# Patient Record
Sex: Male | Born: 1965 | Race: White | Hispanic: No | Marital: Married | Smoking: Never smoker
Health system: Southern US, Community
[De-identification: ages and names within clinical notes are randomized; demographics above are authoritative.]

## PROBLEM LIST (undated history)

## (undated) DIAGNOSIS — L719 Rosacea, unspecified: Secondary | ICD-10-CM

## (undated) DIAGNOSIS — E119 Type 2 diabetes mellitus without complications: Secondary | ICD-10-CM

## (undated) DIAGNOSIS — I10 Essential (primary) hypertension: Secondary | ICD-10-CM

## (undated) DIAGNOSIS — B356 Tinea cruris: Secondary | ICD-10-CM

## (undated) DIAGNOSIS — E785 Hyperlipidemia, unspecified: Secondary | ICD-10-CM

## (undated) DIAGNOSIS — K760 Fatty (change of) liver, not elsewhere classified: Secondary | ICD-10-CM

## (undated) DIAGNOSIS — J309 Allergic rhinitis, unspecified: Secondary | ICD-10-CM

## (undated) DIAGNOSIS — F1020 Alcohol dependence, uncomplicated: Secondary | ICD-10-CM

## (undated) DIAGNOSIS — G5600 Carpal tunnel syndrome, unspecified upper limb: Secondary | ICD-10-CM

## (undated) HISTORY — DX: Type 2 diabetes mellitus without complications: E11.9

## (undated) HISTORY — PX: OTHER SURGICAL HISTORY: SHX169

## (undated) HISTORY — DX: Alcohol dependence, uncomplicated: F10.20

## (undated) HISTORY — DX: Rosacea, unspecified: L71.9

## (undated) HISTORY — DX: Fatty (change of) liver, not elsewhere classified: K76.0

## (undated) HISTORY — DX: Allergic rhinitis, unspecified: J30.9

## (undated) HISTORY — DX: Carpal tunnel syndrome, unspecified upper limb: G56.00

## (undated) HISTORY — PX: FRACTURE SURGERY: SHX138

## (undated) HISTORY — DX: Tinea cruris: B35.6

## (undated) HISTORY — DX: Essential (primary) hypertension: I10

## (undated) HISTORY — DX: Hyperlipidemia, unspecified: E78.5

---

## 2000-08-20 ENCOUNTER — Ambulatory Visit (HOSPITAL_COMMUNITY): Admission: RE | Admit: 2000-08-20 | Discharge: 2000-08-20 | Payer: Self-pay | Admitting: Gastroenterology

## 2000-08-20 ENCOUNTER — Encounter: Payer: Self-pay | Admitting: Gastroenterology

## 2002-08-21 ENCOUNTER — Emergency Department (HOSPITAL_COMMUNITY): Admission: EM | Admit: 2002-08-21 | Discharge: 2002-08-21 | Payer: Self-pay | Admitting: *Deleted

## 2006-05-27 ENCOUNTER — Ambulatory Visit: Payer: Self-pay | Admitting: Endocrinology

## 2007-02-18 ENCOUNTER — Ambulatory Visit: Payer: Self-pay | Admitting: Endocrinology

## 2007-02-18 LAB — CONVERTED CEMR LAB
ALT: 58 units/L — ABNORMAL HIGH (ref 0–53)
AST: 38 units/L — ABNORMAL HIGH (ref 0–37)
Albumin: 3.9 g/dL (ref 3.5–5.2)
Alkaline Phosphatase: 45 units/L (ref 39–117)
BUN: 22 mg/dL (ref 6–23)
Basophils Absolute: 0 10*3/uL (ref 0.0–0.1)
Basophils Relative: 0.6 % (ref 0.0–1.0)
Bilirubin Urine: NEGATIVE
Bilirubin, Direct: 0.1 mg/dL (ref 0.0–0.3)
CO2: 28 meq/L (ref 19–32)
Calcium: 9.2 mg/dL (ref 8.4–10.5)
Chloride: 108 meq/L (ref 96–112)
Cholesterol: 278 mg/dL (ref 0–200)
Creatinine, Ser: 0.9 mg/dL (ref 0.4–1.5)
Direct LDL: 170.4 mg/dL
Eosinophils Absolute: 0.3 10*3/uL (ref 0.0–0.6)
Eosinophils Relative: 5 % (ref 0.0–5.0)
GFR calc Af Amer: 121 mL/min
GFR calc non Af Amer: 100 mL/min
Glucose, Bld: 123 mg/dL — ABNORMAL HIGH (ref 70–99)
HCT: 41.2 % (ref 39.0–52.0)
HDL: 34.5 mg/dL — ABNORMAL LOW (ref >39.0)
Hemoglobin, Urine: NEGATIVE
Hemoglobin: 14.3 g/dL (ref 13.0–17.0)
Hgb A1c MFr Bld: 6.5 % — ABNORMAL HIGH (ref 4.6–6.0)
Ketones, ur: NEGATIVE mg/dL
Leukocytes, UA: NEGATIVE
Lymphocytes Relative: 35 % (ref 12.0–46.0)
MCHC: 34.6 g/dL (ref 30.0–36.0)
MCV: 92.1 fL (ref 78.0–100.0)
Monocytes Absolute: 0.5 10*3/uL (ref 0.2–0.7)
Monocytes Relative: 8.6 % (ref 3.0–11.0)
Neutro Abs: 3.3 10*3/uL (ref 1.4–7.7)
Neutrophils Relative %: 50.8 % (ref 43.0–77.0)
Nitrite: NEGATIVE
Platelets: 203 10*3/uL (ref 150–400)
Potassium: 4.3 meq/L (ref 3.5–5.1)
RBC: 4.47 M/uL (ref 4.22–5.81)
RDW: 11.9 % (ref 11.5–14.6)
Sodium: 141 meq/L (ref 135–145)
Specific Gravity, Urine: 1.025 (ref 1.000–1.03)
TSH: 5.02 microintl units/mL (ref 0.35–5.50)
Total Bilirubin: 0.9 mg/dL (ref 0.3–1.2)
Total CHOL/HDL Ratio: 8.1
Total Protein, Urine: NEGATIVE mg/dL
Total Protein: 7 g/dL (ref 6.0–8.3)
Triglycerides: 235 mg/dL (ref 0–149)
Urine Glucose: NEGATIVE mg/dL
Urobilinogen, UA: 0.2 (ref 0.0–1.0)
VLDL: 47 mg/dL — ABNORMAL HIGH (ref 0–40)
WBC: 6.3 10*3/uL (ref 4.5–10.5)
pH: 6 (ref 5.0–8.0)

## 2007-02-19 ENCOUNTER — Encounter: Payer: Self-pay | Admitting: Internal Medicine

## 2007-02-19 LAB — CONVERTED CEMR LAB: Hep B S Ab: NEGATIVE

## 2007-04-01 ENCOUNTER — Encounter: Payer: Self-pay | Admitting: Endocrinology

## 2007-04-01 DIAGNOSIS — J309 Allergic rhinitis, unspecified: Secondary | ICD-10-CM | POA: Insufficient documentation

## 2007-04-01 DIAGNOSIS — E119 Type 2 diabetes mellitus without complications: Secondary | ICD-10-CM

## 2007-04-01 DIAGNOSIS — I10 Essential (primary) hypertension: Secondary | ICD-10-CM

## 2007-04-01 HISTORY — DX: Allergic rhinitis, unspecified: J30.9

## 2007-04-01 HISTORY — DX: Type 2 diabetes mellitus without complications: E11.9

## 2007-04-01 HISTORY — DX: Essential (primary) hypertension: I10

## 2007-04-02 ENCOUNTER — Ambulatory Visit: Payer: Self-pay | Admitting: Endocrinology

## 2007-09-24 ENCOUNTER — Encounter: Payer: Self-pay | Admitting: Endocrinology

## 2007-09-28 ENCOUNTER — Ambulatory Visit: Payer: Self-pay | Admitting: Endocrinology

## 2007-09-28 LAB — CONVERTED CEMR LAB
ALT: 42 units/L (ref 0–53)
AST: 26 units/L (ref 0–37)
Albumin: 3.8 g/dL (ref 3.5–5.2)
Alkaline Phosphatase: 51 units/L (ref 39–117)
Bilirubin, Direct: 0.2 mg/dL (ref 0.0–0.3)
Cholesterol: 353 mg/dL (ref 0–200)
Direct LDL: 140.1 mg/dL
HDL: 42.2 mg/dL (ref >39.0)
Hgb A1c MFr Bld: 7.2 % — ABNORMAL HIGH (ref 4.6–6.0)
Total Bilirubin: 0.9 mg/dL (ref 0.3–1.2)
Total CHOL/HDL Ratio: 8.4
Total Protein: 7 g/dL (ref 6.0–8.3)
Triglycerides: 655 mg/dL (ref 0–149)
VLDL: 131 mg/dL — ABNORMAL HIGH (ref 0–40)

## 2007-09-29 ENCOUNTER — Ambulatory Visit: Payer: Self-pay | Admitting: Endocrinology

## 2007-09-29 DIAGNOSIS — E785 Hyperlipidemia, unspecified: Secondary | ICD-10-CM

## 2007-09-29 HISTORY — DX: Hyperlipidemia, unspecified: E78.5

## 2007-12-28 ENCOUNTER — Encounter: Payer: Self-pay | Admitting: Endocrinology

## 2008-09-30 ENCOUNTER — Encounter: Payer: Self-pay | Admitting: Endocrinology

## 2008-10-06 ENCOUNTER — Ambulatory Visit: Payer: Self-pay | Admitting: Endocrinology

## 2008-10-06 DIAGNOSIS — G5600 Carpal tunnel syndrome, unspecified upper limb: Secondary | ICD-10-CM

## 2008-10-06 HISTORY — DX: Carpal tunnel syndrome, unspecified upper limb: G56.00

## 2008-10-06 LAB — CONVERTED CEMR LAB: Hgb A1c MFr Bld: 7 % — ABNORMAL HIGH (ref 4.6–6.0)

## 2008-11-07 ENCOUNTER — Ambulatory Visit: Payer: Self-pay | Admitting: Endocrinology

## 2008-11-07 DIAGNOSIS — B356 Tinea cruris: Secondary | ICD-10-CM | POA: Insufficient documentation

## 2008-11-07 HISTORY — DX: Tinea cruris: B35.6

## 2009-10-03 ENCOUNTER — Encounter: Payer: Self-pay | Admitting: Endocrinology

## 2009-10-23 ENCOUNTER — Telehealth: Payer: Self-pay | Admitting: Endocrinology

## 2009-10-27 ENCOUNTER — Telehealth: Payer: Self-pay | Admitting: Endocrinology

## 2009-11-28 ENCOUNTER — Ambulatory Visit: Payer: Self-pay | Admitting: Endocrinology

## 2009-11-28 LAB — CONVERTED CEMR LAB
ALT: 36 units/L (ref 0–53)
AST: 23 units/L (ref 0–37)
Albumin: 3.8 g/dL (ref 3.5–5.2)
Alkaline Phosphatase: 58 units/L (ref 39–117)
BUN: 21 mg/dL (ref 6–23)
Basophils Absolute: 0 10*3/uL (ref 0.0–0.1)
Basophils Relative: 0.3 % (ref 0.0–3.0)
Bilirubin Urine: NEGATIVE
Bilirubin, Direct: 0 mg/dL (ref 0.0–0.3)
CO2: 30 meq/L (ref 19–32)
Calcium: 9.4 mg/dL (ref 8.4–10.5)
Chloride: 101 meq/L (ref 96–112)
Cholesterol: 340 mg/dL — ABNORMAL HIGH (ref 0–200)
Creatinine, Ser: 0.9 mg/dL (ref 0.4–1.5)
Creatinine,U: 138.3 mg/dL
Direct LDL: 131.8 mg/dL
Eosinophils Absolute: 0.3 10*3/uL (ref 0.0–0.7)
Eosinophils Relative: 3.6 % (ref 0.0–5.0)
GFR calc non Af Amer: 98.69 mL/min (ref >60)
Glucose, Bld: 183 mg/dL — ABNORMAL HIGH (ref 70–99)
HCT: 40.2 % (ref 39.0–52.0)
HDL: 48.9 mg/dL (ref >39.00)
Hemoglobin, Urine: NEGATIVE
Hemoglobin: 14.4 g/dL (ref 13.0–17.0)
Hgb A1c MFr Bld: 7.7 % — ABNORMAL HIGH (ref 4.6–6.5)
Ketones, ur: NEGATIVE mg/dL
Leukocytes, UA: NEGATIVE
Lymphocytes Relative: 25.3 % (ref 12.0–46.0)
Lymphs Abs: 1.9 10*3/uL (ref 0.7–4.0)
MCHC: 35.7 g/dL (ref 30.0–36.0)
MCV: 91.7 fL (ref 78.0–100.0)
Microalb Creat Ratio: 12.3 mg/g (ref 0.0–30.0)
Microalb, Ur: 1.7 mg/dL (ref 0.0–1.9)
Monocytes Absolute: 0.7 10*3/uL (ref 0.1–1.0)
Monocytes Relative: 9.4 % (ref 3.0–12.0)
Neutro Abs: 4.5 10*3/uL (ref 1.4–7.7)
Neutrophils Relative %: 61.4 % (ref 43.0–77.0)
Nitrite: NEGATIVE
Platelets: 197 10*3/uL (ref 150.0–400.0)
Potassium: 5 meq/L (ref 3.5–5.1)
RBC: 4.39 M/uL (ref 4.22–5.81)
RDW: 13.3 % (ref 11.5–14.6)
Sodium: 139 meq/L (ref 135–145)
Specific Gravity, Urine: >=1.03 (ref 1.000–1.030)
TSH: 4.43 microintl units/mL (ref 0.35–5.50)
Total Bilirubin: 0.6 mg/dL (ref 0.3–1.2)
Total CHOL/HDL Ratio: 7
Total Protein, Urine: NEGATIVE mg/dL
Total Protein: 7.1 g/dL (ref 6.0–8.3)
Triglycerides: 773 mg/dL — ABNORMAL HIGH (ref 0.0–149.0)
Urine Glucose: NEGATIVE mg/dL
Urobilinogen, UA: 0.2 (ref 0.0–1.0)
VLDL: 154.6 mg/dL — ABNORMAL HIGH (ref 0.0–40.0)
WBC: 7.4 10*3/uL (ref 4.5–10.5)
pH: 5.5 (ref 5.0–8.0)

## 2009-11-30 ENCOUNTER — Ambulatory Visit: Payer: Self-pay | Admitting: Endocrinology

## 2009-11-30 DIAGNOSIS — F1020 Alcohol dependence, uncomplicated: Secondary | ICD-10-CM

## 2009-11-30 DIAGNOSIS — F1010 Alcohol abuse, uncomplicated: Secondary | ICD-10-CM | POA: Insufficient documentation

## 2009-11-30 HISTORY — DX: Alcohol dependence, uncomplicated: F10.20

## 2010-05-07 ENCOUNTER — Telehealth: Payer: Self-pay | Admitting: Endocrinology

## 2010-05-14 ENCOUNTER — Ambulatory Visit: Payer: Self-pay | Admitting: Endocrinology

## 2010-05-14 ENCOUNTER — Encounter: Payer: Self-pay | Admitting: Endocrinology

## 2010-05-14 DIAGNOSIS — L719 Rosacea, unspecified: Secondary | ICD-10-CM

## 2010-05-14 HISTORY — DX: Rosacea, unspecified: L71.9

## 2010-09-16 LAB — CONVERTED CEMR LAB
ALT: 39 units/L (ref 0–53)
AST: 20 units/L (ref 0–37)
Albumin: 3.7 g/dL (ref 3.5–5.2)
Alkaline Phosphatase: 44 units/L (ref 39–117)
BUN: 16 mg/dL (ref 6–23)
Basophils Absolute: 0 10*3/uL (ref 0.0–0.1)
Basophils Relative: 0.3 % (ref 0.0–3.0)
Bilirubin Urine: NEGATIVE
Bilirubin, Direct: 0.1 mg/dL (ref 0.0–0.3)
CO2: 29 meq/L (ref 19–32)
Calcium: 9.6 mg/dL (ref 8.4–10.5)
Chloride: 103 meq/L (ref 96–112)
Cholesterol: 291 mg/dL — ABNORMAL HIGH (ref 0–200)
Creatinine, Ser: 0.9 mg/dL (ref 0.4–1.5)
Creatinine,U: 103.7 mg/dL
Direct LDL: 153.3 mg/dL
Eosinophils Absolute: 0.3 10*3/uL (ref 0.0–0.7)
Eosinophils Relative: 4.3 % (ref 0.0–5.0)
GFR calc non Af Amer: 103.77 mL/min (ref >60)
Glucose, Bld: 181 mg/dL — ABNORMAL HIGH (ref 70–99)
HCT: 41.4 % (ref 39.0–52.0)
HDL: 40.8 mg/dL (ref >39.00)
Hemoglobin, Urine: NEGATIVE
Hemoglobin: 14.7 g/dL (ref 13.0–17.0)
Hgb A1c MFr Bld: 8.3 % — ABNORMAL HIGH (ref 4.6–6.5)
Ketones, ur: NEGATIVE mg/dL
Leukocytes, UA: NEGATIVE
Lymphocytes Relative: 32.1 % (ref 12.0–46.0)
Lymphs Abs: 2.1 10*3/uL (ref 0.7–4.0)
MCHC: 35.4 g/dL (ref 30.0–36.0)
MCV: 92.7 fL (ref 78.0–100.0)
Microalb Creat Ratio: 5.1 mg/g (ref 0.0–30.0)
Microalb, Ur: 5.3 mg/dL — ABNORMAL HIGH (ref 0.0–1.9)
Monocytes Absolute: 0.5 10*3/uL (ref 0.1–1.0)
Monocytes Relative: 8.4 % (ref 3.0–12.0)
Neutro Abs: 3.6 10*3/uL (ref 1.4–7.7)
Neutrophils Relative %: 54.9 % (ref 43.0–77.0)
Nitrite: NEGATIVE
Platelets: 208 10*3/uL (ref 150.0–400.0)
Potassium: 5.2 meq/L — ABNORMAL HIGH (ref 3.5–5.1)
RBC: 4.47 M/uL (ref 4.22–5.81)
RDW: 13.6 % (ref 11.5–14.6)
Sodium: 139 meq/L (ref 135–145)
Specific Gravity, Urine: 1.025 (ref 1.000–1.030)
TSH: 5 microintl units/mL (ref 0.35–5.50)
Total Bilirubin: 0.5 mg/dL (ref 0.3–1.2)
Total CHOL/HDL Ratio: 7
Total Protein, Urine: NEGATIVE mg/dL
Total Protein: 6.5 g/dL (ref 6.0–8.3)
Triglycerides: 394 mg/dL — ABNORMAL HIGH (ref 0.0–149.0)
Urine Glucose: NEGATIVE mg/dL
Urobilinogen, UA: 0.2 (ref 0.0–1.0)
VLDL: 78.8 mg/dL — ABNORMAL HIGH (ref 0.0–40.0)
WBC: 6.5 10*3/uL (ref 4.5–10.5)
pH: 5.5 (ref 5.0–8.0)

## 2010-09-18 NOTE — Assessment & Plan Note (Signed)
Summary: discuss meds/#/cd   Vital Signs:  Patient profile:   45 year old male Height:      69 inches (175.26 cm) Weight:      266.38 pounds (121.08 kg) BMI:     39.48 O2 Sat:      97 % on Room air Temp:     98.4 degrees F (36.89 degrees C) oral Pulse rate:   72 / minute BP sitting:   110 / 80  (left arm) Cuff size:   large  Vitals Entered By: Brenton Grills MA (May 14, 2010 8:11 AM)  O2 Flow:  Room air CC: Dicuss medications/pt is not currently taking Bromocriptine/aj Is Patient Diabetic? Yes   CC:  Dicuss medications/pt is not currently taking Bromocriptine/aj.  History of Present Illness: here for regular wellness examination.  He's feeling pretty well in general, and does not smoke.   Current Medications (verified): 1)  Multivitamins   Tabs (Multiple Vitamin) .... Take 1 By Mouth Qd 2)  Actoplus Met 15-500 Mg  Tabs (Pioglitazone Hcl-Metformin Hcl) .... Take 2 By Mouth Q Am, 1 By Mouth Pm 3)  Zestoretic 20-12.5 Mg  Tabs (Lisinopril-Hydrochlorothiazide) .... Qd 4)  Lotrisone 1-0.05 % Crea (Clotrimazole-Betamethasone) .... Two Times A Day As Needed Rash 5)  Bromocriptine Mesylate 2.5 Mg Tabs (Bromocriptine Mesylate) .... 1/2 Tab At Bedtime 6)  Metronidazole 0.75 % Gel (Metronidazole) .... Apply To The Face At Bedtime  Allergies (verified): 1)  ! Mevacor 2)  ! Vasotec  Family History: Reviewed history from 11/07/2008 and no changes required. no cancer  Social History: Reviewed history from 11/07/2008 and no changes required. works indust mfg married  Review of Systems  The patient denies weight loss, vision loss, decreased hearing, chest pain, syncope, dyspnea on exertion, prolonged cough, headaches, abdominal pain, melena, hematochezia, severe indigestion/heartburn, hematuria, suspicious skin lesions, and depression.         facial rash is better with metrogel  Physical Exam  General:  morbidly obese.  no distress  Head:  head: no deformity eyes: no  periorbital swelling, no proptosis external nose and ears are normal mouth: no lesion seen Neck:  Supple without thyroid enlargement or tenderness.  Abdomen:  abdomen is soft, nontender.  no hepatosplenomegaly.   not distended.  no hernia  Genitalia:  Normal external male genitalia with no urethral discharge.  Msk:  muscle bulk and strength are grossly normal.  no obvious joint swelling.  gait is normal and steady  Pulses:  dorsalis pedis intact bilat.  no carotid bruit  Extremities:  no deformity.  no ulcer on the feet.  feet are of normal color and temp.   trace right pedal edema and trace left pedal edema.   Neurologic:  cn 2-12 grossly intact.   readily moves all 4's.   sensation is intact to touch on the feet  Skin:  normal texture and temp.  no rash.  not diaphoretic  Cervical Nodes:  No significant adenopathy.  Psych:  Alert and cooperative; normal mood and affect; normal attention span and concentration.   Additional Exam:  SEPARATE EVALUATION FOLLOWS--EACH PROBLEM HERE IS NEW, NOT RESPONDING TO TREATMENT, OR POSES SIGNIFICANT RISK TO THE PATIENT'S HEALTH: HISTORY OF THE PRESENT ILLNESS: he takes meds as rx'ed.  no weight gain PAST MEDICAL HISTORY reviewed and up to date today REVIEW OF SYSTEMS: denies chest pain PHYSICAL EXAMINATION: clear to auscultation.  no respiratory distress dorsalis pedis intact bilat.  no carotid bruit cv:  reg rate and rhythm, no  murmur LAB/XRAY RESULTS: elev a1c and ldl are noted IMPRESSION: dm, needs increased rx dyslipidemia, needs increased rx PLAN: see instruction sheet   Impression & Recommendations:  Problem # 1:  ROUTINE GENERAL MEDICAL EXAM@HEALTH  CARE FACL (ICD-V70.0)  Other Orders: EKG w/ Interpretation (93000) TLB-Lipid Panel (80061-LIPID) TLB-BMP (Basic Metabolic Panel-BMET) (80048-METABOL) TLB-CBC Platelet - w/Differential (85025-CBCD) TLB-Hepatic/Liver Function Pnl (80076-HEPATIC) TLB-TSH (Thyroid Stimulating  Hormone) (84443-TSH) TLB-A1C / Hgb A1C (Glycohemoglobin) (83036-A1C) TLB-Microalbumin/Creat Ratio, Urine (82043-MALB) TLB-Udip w/ Micro (81001-URINE) Est. Patient Level III (16109) Est. Patient 40-64 years (60454)  Preventive Care Screening     flu shot and pneumovax refused 2011   Patient Instructions: 1)  please consider these measures for your health:  minimize alcohol.  do not use tobacco products.  have a colonoscopy at least every 10 years from age 58.  keep firearms safely stored.  always use seat belts.  have working smoke alarms in your home.  see an eye doctor and dentist regularly.  never drive under the influence of alcohol or drugs (including prescription drugs).  those with fair skin should take precautions against the sun. 2)  blood tests are being ordered for you today.  please call [phone] to hear your test results. 3)  Please schedule a follow-up appointment in 6 months. 4)  (pt refuses flu shot and pneumovax) 5)  (update: i left message on phone-tree:  start parlodel 2.5 mg at bedtime.  you should take chol medication.    ret jan 2012). Prescriptions: BROMOCRIPTINE MESYLATE 2.5 MG TABS (BROMOCRIPTINE MESYLATE) 1/2 tab at bedtime  #30 x 5   Entered and Authorized by:   Minus Breeding MD   Signed by:   Minus Breeding MD on 05/14/2010   Method used:   [facility]        [facility] [address] [phone]* (retail)       [address]       [place], [place]  [address]       Ph: [phone] or [phone]       Fax: [phone]   RxID:   [id number]    Preventive Care Screening     flu shot and pneumovax refused 2011

## 2010-09-18 NOTE — Progress Notes (Signed)
  Phone Note Outgoing Call   Call placed by: Ami Bullins CMA,  October 23, 2009 1:47 PM Call placed to: Patient Summary of Call: spoke with pt and informed him that he does need to come into the office for a follow up office visit. Pt is due for A1C, LDL and triglyceride. This needs to be put in EMR orders. Pt states he will come in for labs and when he has his labs drawn he will come up and sch an office visit. Can you put these labs orders in thanks. Initial call taken by: Ami Bullins CMA,  October 23, 2009 1:49 PM  Follow-up for Phone Call        cpx labs a1c and microalbumin 250.00 Follow-up by: Minus Breeding MD,  October 23, 2009 2:07 PM  Additional Follow-up for Phone Call Additional follow up Details #1::        sent flag to schedulers to put in for pt Additional Follow-up by: Ami Bullins CMA,  October 23, 2009 2:17 PM

## 2010-09-18 NOTE — Progress Notes (Signed)
    Appended Document:  Left message for pt to return my call. Pt needs to schedule appt.

## 2010-09-18 NOTE — Letter (Signed)
Summary: [facility]  [facility]   Imported By: Sherian Rein 10/10/2009 10:33:41  _____________________________________________________________________  External Attachment:    Type:   Image     Comment:   External Document

## 2010-09-18 NOTE — Assessment & Plan Note (Signed)
Summary: FU ON MEDS/NWS   Vital Signs:  Patient profile:   45 year old male Height:      69 inches (175.26 cm) Weight:      268.38 pounds (121.99 kg) BMI:     39.78 O2 Sat:      96 % on Room air Temp:     98.0 degrees F (36.67 degrees C) oral Pulse rate:   78 / minute BP sitting:   108 / 70  (left arm) Cuff size:   large  Vitals Entered By: Josph Macho RMA (November 30, 2009 9:04 AM)  O2 Flow:  Room air CC: Follow-up visit/ CF Is Patient Diabetic? Yes   CC:  Follow-up visit/ CF.  History of Present Illness: pt says he drinks alcohol "more than i should" no cbg record, but states cbg's are high-100's. pt states few mos of slight rash on the malar areas, but no associated itching.  Current Medications (verified): 1)  Multivitamins   Tabs (Multiple Vitamin) .... Take 1 By Mouth Qd 2)  Actoplus Met 15-500 Mg  Tabs (Pioglitazone Hcl-Metformin Hcl) .... Take 2 By Mouth Q Am, 1 By Mouth Pm 3)  Zestoretic 20-12.5 Mg  Tabs (Lisinopril-Hydrochlorothiazide) .... Qd 4)  Lotrisone 1-0.05 % Crea (Clotrimazole-Betamethasone) .... Two Times A Day As Needed Rash  Allergies (verified): 1)  ! Mevacor 2)  ! Vasotec  Past History:  Past Medical History: Last updated: 04/01/2007 Allergic rhinitis Diabetes mellitus, type II Hypertension Dyslipidemia Hepatic Steatosis  Review of Systems       The patient complains of weight gain.  The patient denies dyspnea on exertion.    Physical Exam  General:  obese.   Head:  mild red rash on the malar areas Additional Exam:  test results are reviewed: Cholesterol          [H]  340 mg/dL                   6-045 Triglycerides        [H]  773.0 mg/dL                 4.0-981.1 HDL                       91.47 mg/dL   Cholesterol LDL    829.5 mg/dL  Hemoglobin A2Z       [H]  7.7 %      Impression & Recommendations:  Problem # 1:  DIABETES MELLITUS, TYPE II (ICD-250.00) needs increased rx  Problem # 2:  HYPERLIPIDEMIA, ATHEROGENIC  (ICD-272.4) needs increased rx  Problem # 3:  ALCOHOLISM (ICD-303.90) this worsens both #1 and 2  Problem # 4:  weight gain  Problem # 5:  rash ? rosacea  Medications Added to Medication List This Visit: 1)  Bromocriptine Mesylate 2.5 Mg Tabs (Bromocriptine mesylate) .... 1/2 tab at bedtime 2)  Metronidazole 0.75 % Gel (Metronidazole) .... Apply to the face at bedtime  Other Orders: Est. Patient Level IV (30865)  Patient Instructions: 1)  you should consider weight-loss surgery.  please let me know if you wish to be scheduled for an informational meeting. 2)  please consider these measures for your health:  minimize alcohol.  do not use tobacco products.  keep firearms safely stored.  always use seat belts.  have working smoke alarms in your home.  see the dentist regularly.  never drive under the influence of alcohol or drugs (including prescription drugs).  those  with fair skin should take precautions against the sun. 3)  add bromocriptine 1/2 of 2.5 mg at night 4)  take a trial of metrogel cream at bedtime, to the face 5)  in 3 months, go to lab for fasting blood tests (lipids 272.5, and a1c 250.00), then have a physical a week or so later. 6)  please let me know if you decide to take the "fenofibrate" pill for the triglycerides. Prescriptions: METRONIDAZOLE 0.75 % GEL (METRONIDAZOLE) apply to the face at bedtime  #1 med tube x 11   Entered and Authorized by:   Minus Breeding MD   Signed by:   Minus Breeding MD on 11/30/2009   Method used:   [facility]        [facility] [address] [phone]* (retail)       [address]       [place], [place]  [address]       Ph: [phone] or [phone]       Fax: [phone]   RxID:   [id number] BROMOCRIPTINE MESYLATE 2.5 MG TABS (BROMOCRIPTINE MESYLATE) 1/2 tab at bedtime  #30 x 5   Entered and Authorized by:   Minus Breeding MD   Signed by:   Minus Breeding MD on 11/30/2009   Method used:    [facility]ronically to        [facility] [address] [phone]* (retail)       [address]       [place], [place]  [address]       Ph: [phone] or [phone]       Fax: [phone]   RxID:   [id number]

## 2010-09-18 NOTE — Progress Notes (Signed)
Summary: Actos?  Phone Note Call from Patient Call back at Home Phone [phone]   Caller: Patient Summary of Call: Pt called concerned about recent television ads advising on the dangers of long-term use of Actos. Pt is requesting advisement form [facility]. Pt advised that MD out of office today but will wait for his return to advise. Initial call taken by: Margaret Pyle, CMA,  May 07, 2010 3:05 PM  Follow-up for Phone Call        the television ads are because some people who take actos took avandia prior, and those are the patients they want.  the side-effect of the med is swellling of the legs.  call if you have that. Follow-up by: Minus Breeding MD,  May 07, 2010 5:28 PM  Additional Follow-up for Phone Call Additional follow up Details #1::        left message on machine for pt to return my call  Margaret Pyle, CMA  May 08, 2010 8:09 AM   Pt informed in detail via VM, told to call back with any further questions Additional Follow-up by: Margaret Pyle, CMA,  May 08, 2010 9:33 AM

## 2010-10-04 ENCOUNTER — Telehealth: Payer: Self-pay | Admitting: Endocrinology

## 2010-10-10 NOTE — Progress Notes (Signed)
Summary: rx refill req  Phone Note Call from Patient Call back at Home Phone [phone]   Caller: Patient Summary of Call: Pt is requesting refill of Ascensia test strips Initial call taken by: Brenton Grills CMA Duncan Dull),  October 04, 2010 3:29 PM  Follow-up for Phone Call        pt informed via VM Follow-up by: Brenton Grills CMA Duncan Dull),  October 04, 2010 3:31 PM    New/Updated Medications: ASCENSIA AUTODISC TEST  DISK (GLUCOSE BLOOD) use as directed dx 250.00 Prescriptions: ASC[employer]IA AUTODISC TEST  DISK (GLUCOSE BLOOD) use as directed dx 250.00  #1 month x 3   Entered by:   Brenton Grills CMA (AAMA)   Authorized by:   Minus Breeding MD   Signed by:   Brenton Grills CMA (AAMA) on 10/04/2010   Method used:   Electronically to        [facility] [address] [phone]* (retail)       [address]       [place], [place]  [address]       Ph: [phone] or [phone]       Fax: [phone]   RxID:   [id number]

## 2010-12-17 ENCOUNTER — Inpatient Hospital Stay (HOSPITAL_COMMUNITY)
Admission: EM | Admit: 2010-12-17 | Discharge: 2010-12-18 | DRG: 512 | Disposition: A | Discharge status: Home / Self Care | Payer: Worker's Compensation | Attending: Orthopedic Surgery | Admitting: Orthopedic Surgery

## 2010-12-17 ENCOUNTER — Emergency Department (HOSPITAL_COMMUNITY): Payer: Worker's Compensation

## 2010-12-17 DIAGNOSIS — I10 Essential (primary) hypertension: Secondary | ICD-10-CM | POA: Diagnosis present

## 2010-12-17 DIAGNOSIS — W3189XA Contact with other specified machinery, initial encounter: Secondary | ICD-10-CM

## 2010-12-17 DIAGNOSIS — E119 Type 2 diabetes mellitus without complications: Secondary | ICD-10-CM | POA: Diagnosis present

## 2010-12-17 DIAGNOSIS — S52509B Unspecified fracture of the lower end of unspecified radius, initial encounter for open fracture type I or II: Principal | ICD-10-CM | POA: Diagnosis present

## 2010-12-17 DIAGNOSIS — Y9289 Other specified places as the place of occurrence of the external cause: Secondary | ICD-10-CM

## 2010-12-17 LAB — CBC
HCT: 44 % (ref 39.0–52.0)
MCH: 32.1 pg (ref 26.0–34.0)
MCV: 92.8 fL (ref 78.0–100.0)
Platelets: 207 10*3/uL (ref 150–400)
RDW: 13.6 % (ref 11.5–15.5)

## 2010-12-17 LAB — SAMPLE TO BLOOD BANK

## 2010-12-17 LAB — GLUCOSE, CAPILLARY
Glucose-Capillary: 276 mg/dL — ABNORMAL HIGH (ref 70–99)
Glucose-Capillary: 226 mg/dL — ABNORMAL HIGH (ref 70–99)

## 2010-12-17 LAB — DIFFERENTIAL
Basophils Absolute: 0 10*3/uL (ref 0.0–0.1)
Lymphocytes Relative: 39 % (ref 12–46)
Neutro Abs: 3.6 10*3/uL (ref 1.7–7.7)

## 2010-12-17 LAB — POCT I-STAT, CHEM 8
BUN: 17 mg/dL (ref 6–23)
Calcium, Ion: 1.1 mmol/L — ABNORMAL LOW (ref 1.12–1.32)
Chloride: 108 mEq/L (ref 96–112)
Creatinine, Ser: 1.1 mg/dL (ref 0.4–1.5)
Sodium: 141 mEq/L (ref 135–145)

## 2010-12-17 LAB — MRSA PCR SCREENING: MRSA by PCR: NEGATIVE

## 2010-12-18 LAB — GLUCOSE, CAPILLARY: Glucose-Capillary: 241 mg/dL — ABNORMAL HIGH (ref 70–99)

## 2010-12-24 ENCOUNTER — Other Ambulatory Visit: Payer: Self-pay | Admitting: Endocrinology

## 2010-12-25 NOTE — H&P (Signed)
NAME:  BRODERIC, BARA NO.:  [id number]  MEDICAL RECORD NO.:  [id number]           PATIENT TYPE:  I  LOCATION:  5012                         FACILITY:  [facility]  PHYSICIAN:  Madelynn Done, MD  DATE OF BIRTH:  10/01/1965  DATE OF ADMISSION:  12/17/2010 DATE OF DISCHARGE:                             HISTORY & PHYSICAL   REASON FOR ADMISSION:  Right distal radius fracture.  BRIEF HISTORY:  Mr. Rayman is a 45 year old right-hand-dominant gentleman who was at work, sustained a crushing injury to his right distal wrist.  The patient's wrist caught in between a metal object. The patient had to call someone over to have the object released off his distal radius.  The patient presented to the emergency department after his injury.  The patient was seen and evaluated by the emergency department and I was consulted for further management.  The patient was felt to have an open distal radius fracture.  No other injuries other than this to his right arm.  The patient complains of pain to the right arm.  He has a little bit of tingling in his fingertips.  His past medical history is significant for diabetes and hypertension. The remainder of his medical history as noted in the chart by Dr. Read Drivers and reviewed in the medical record.  On examination, this is healthy-appearing male.  Height and weight listed.  Vital Signs as noted in the medical chart.  He has a good hand coordination in his left hand.  Normal mood.  He is alert and oriented to person, place, and time in no acute distress.  On examination of the right upper extremity, the patient does have a several centimeter transverse laceration over the dorsal aspect of the wrist.  No wounds volarly.  Does appear to communicate with the fracture site.  He has a very poor digital extension and finger motion secondary to his injury.  His arm is swollen, but his compartments are soft.  He has a good radial pulse.  His  fingertips are warm, well-perfused. Sensation to light touch is present in the median and ulnar distribution.  His wrist and forearm mobility is not assessed secondary to his injury.  He does not have any elbow pain to palpation.  He has good shoulder mobility, but it is limited secondary to his underlying injury.  His radiographs were reviewed which did show the open wound injury to the soft tissue with the underlying distal radial metaphyseal fracture and distal ulna fracture.  IMPRESSION:  Right distal radius and ulna open fractures and soft tissue injury.  PLAN:  Today the findings were reviewed with Arlys John.  Lucciano was splinted temporarily in the ER.  Betadine dressing was applied to the open wound. The patient will be taken to the operating room this morning.  We talked about the risks of surgery to include, but not limited to bleeding, infection, damage to nearby nerves, arteries, tendons, nonunion, malunion, hardware repair, loss of motion of wrist and digits, and need for further surgical intervention.  We talked about the reason for intervention.  Overnight admission for IV antibiotics and pain control. All  questions were answered and encouraged with Hardin today.  The patient voiced understanding of the plan, the reason for the intervention.  Talked about exploration of the wound and possible tendon repair as indicated.  The patient voiced understanding of the reason for the intervention.  The patient will be admitted for the operative procedure today.  Please note the patient did have preoperative lab studies was were in the medical chart.  EKG and chest x-rays were done per the ER emergency department.     Madelynn Done, MD     FWO/MEDQ  D:  12/17/2010  T:  12/17/2010  Job:  [id number]  Electronically Signed by Bradly Bienenstock IV MD on 12/25/2010 09:07:41 AM

## 2010-12-25 NOTE — Op Note (Signed)
NAME:  Larry Rose, LEVER NO.:  [id number]  MEDICAL RECORD NO.:  [id number]           PATIENT TYPE:  I  LOCATION:  5012                         FACILITY:  [facility]  PHYSICIAN:  Madelynn Done, MD  DATE OF BIRTH:  [birth year]  DATE OF PROCEDURE:  12/17/2010 DATE OF DISCHARGE:                              OPERATIVE REPORT   PREOPERATIVE DIAGNOSIS:  Grade 1 open right distal radius fracture, Galeazzi fracture-dislocation.  POSTOPERATIVE DIAGNOSIS:  Grade 1 open right distal radius fracture, Galeazzi fracture-dislocation.  ATTENDING PHYSICIAN:  Dr. Bradly Bienenstock, who was scrubbed and present for the entire procedure.  ASSISTANT SURGEON:  Dr. Dairl Ponder, who was scrubbed and present for the entire procedure.  SURGICAL PROCEDURE: 1. Open treatment of right distal radial Galeazzi fracture-dislocation     with closed treatment of the distal radioulnar joint without     internal fixation. 2. Radiographs, 3 views, right wrist. 3. Right wrist brachioradialis tendon lengthening and release. 4. Debridement of skin, subcutaneous tissue, and bone associated with     open fracture, right radius. 5. Closed treatment of right distal ulna fracture, ulnar head     fracture.  SURGICAL TOURNIQUET TIME:  Less than 1 hour at 250 mmHg.  ANESTHESIA:  General via LMA.  SURGICAL INDICATIONS:  Mr. Main is a 45 year old right-hand- dominant gentleman, who sustained a work-related injury when his hand caught in a metal object almost nearly constricting his right wrist and hand.  The patient was seen and evaluated in the emergency department. Based on the degree of injury and the open nature of the injury, it was recommended that he undergo the above procedure.  Risks, benefits, and alternatives were discussed in detail with the patient and signed informed consent was obtained.  Risks include but not limited to bleeding; infection; damage to nearby nerves, arteries, or  tendons; nonunion; malunion; hardware failure; loss of motion of the wrist and digits; need for further surgical intervention.  SURGICAL IMPLANTS:  Hand innovations volar distal radius plate, standard distal radius plate, 4 bicortical screws proximally.  DESCRIPTION OF PROCEDURE:  The patient was properly identified in the preop holding area and marked with permanent marker made on the right wrist to indicate the correct operative site.  The patient was brought back to the operating room and placed supine on the anesthesia room table where general anesthesia was then administered.  The patient tolerated this well.  Well-padded tourniquet was then placed on the right brachium and sealed with 1000 drape.  The right upper extremity was prepped and draped in normal sterile fashion.  Time-out was called. The correct side was identified and the procedure then begun.  Attention was then turned to the open wound where the patient's 1-cm open wound was then lengthened in a transverse fashion over the dorsal aspect of the wrist.  Dissection was then carried down to the fracture site. Copious irrigation and debridement of the skin, subcutaneous tissue, and bone was then carried out.  After debridement sharply with knifes and rongeurs, the longitudinal incision was made directly over the volar region of the wrist, standard FCR approach was then made to  the wrist. Dissection was then carried down through the skin and subcutaneous tissue.  The tourniquet was then insufflated.  The FCR sheath was then opened both proximally and distally.  After release of the FCR sheath, the FPL was identified and retracting into respective direction. Following this, there was very little remaining of the pronator quadratus, but was left of it, and then was removed out of the fracture site.  In order to reduce the fracture, the brachioradialis had to be lengthened and had to be released off the radial styloid.   Dissection was then carried along the radial styloid, releasing the brachioradialis.  After released the brachioradialis fracture, it was able to be reduced.  An open reduction was then performed of the Galeazzi fracture variant.  An open reduction was then performed and held with a reduction clamp.  Following this, the distal radius plate, standard Hand innovations, the two plate was then applied.  The oblong screw hole was then used with the bicortical screw.  Using oblong hole, the plate height was then adjusted in appropriate alignment.  After plate height was adjusted, distal fixation was then carried out beginning from an ulnar to radial direction with locking pegs.  These were all filled in the distal segment.  After placement of all locking pegs with the appropriate drill and depth gauge measurement, attention was then turned proximally with two more bicortical screws were then placed with a good bicortical purchase using 3.5 nonlocking screws. After this was carried out, the distal radioulnar joint was assessed. There was no gross instability.  There is good alignment of the distal ulnar head.  Copious irrigation was done throughout.  The dorsal wound was then loosely reapproximated with 4-0 nylon.  The open fracture area was not closed.  The incision site at pronator quadratus was left and it was closed with 2-0 Vicryl, subcutaneous tissue was closed with 4-0 Vicryl, and the skin was closed with running 4-0 Prolene suture. Adaptic 4x4s and sterile compressive bandage then applied.  The patient was then placed in a well-padded sugar-tong splint.  He was then extubated and taken to recovery room in good condition.  POSTOPERATIVE PLAN:  The patient will be admitted for IV antibiotics and pain control.  Continue to follow him closely in the office as an outpatient.  He will have four-long arm immobilization for the first 2 weeks, and then after a long-arm immobilization, transition  or short-arm cast for a total of 4-5 weeks immobilization, and then begin a therapy regimen at that point.  Radiographs of three views, the wrist did show the volar plate fixation. There was a good position in both planes with good alignment in the distal radial shaft and distal ulna and distal radioulnar joint.     Madelynn Done, MD     FWO/MEDQ  D:  12/17/2010  T:  12/18/2010  Job:  [id number]  Electronically Signed by Bradly Bienenstock IV MD on 12/25/2010 09:07:43 AM

## 2011-02-27 ENCOUNTER — Other Ambulatory Visit: Payer: Self-pay | Admitting: Endocrinology

## 2011-04-21 ENCOUNTER — Other Ambulatory Visit: Payer: Self-pay | Admitting: Endocrinology

## 2011-05-09 ENCOUNTER — Telehealth: Payer: Self-pay | Admitting: *Deleted

## 2011-05-09 DIAGNOSIS — Z0389 Encounter for observation for other suspected diseases and conditions ruled out: Secondary | ICD-10-CM

## 2011-05-09 DIAGNOSIS — E119 Type 2 diabetes mellitus without complications: Secondary | ICD-10-CM

## 2011-05-09 DIAGNOSIS — Z0000 Encounter for general adult medical examination without abnormal findings: Secondary | ICD-10-CM

## 2011-05-09 NOTE — Telephone Encounter (Signed)
Labs entered into Epic for upcoming CPX appointment. 

## 2011-05-09 NOTE — Telephone Encounter (Signed)
Message copied by Carin Primrose on Thu May 09, 2011 10:16 AM ------      Message from: Etheleen Sia      Created: Thu May 09, 2011  9:41 AM      Regarding: CPX LABS AND A1C, MALB ? PT IS DIABETIC       PHYSICAL IS ON OCT 2

## 2011-05-17 ENCOUNTER — Ambulatory Visit (INDEPENDENT_AMBULATORY_CARE_PROVIDER_SITE_OTHER): Payer: Worker's Compensation

## 2011-05-17 DIAGNOSIS — Z0389 Encounter for observation for other suspected diseases and conditions ruled out: Secondary | ICD-10-CM

## 2011-05-17 DIAGNOSIS — Z0000 Encounter for general adult medical examination without abnormal findings: Secondary | ICD-10-CM

## 2011-05-17 DIAGNOSIS — E119 Type 2 diabetes mellitus without complications: Secondary | ICD-10-CM

## 2011-05-17 LAB — URINALYSIS, ROUTINE W REFLEX MICROSCOPIC
Bilirubin Urine: NEGATIVE
Hgb urine dipstick: NEGATIVE
Ketones, ur: NEGATIVE
Leukocytes, UA: NEGATIVE
pH: 6 (ref 5.0–8.0)

## 2011-05-17 LAB — BASIC METABOLIC PANEL
Calcium: 9.1 mg/dL (ref 8.4–10.5)
GFR: 119.11 mL/min (ref >60.00)
Potassium: 5.1 mEq/L (ref 3.5–5.1)
Sodium: 130 mEq/L — ABNORMAL LOW (ref 135–145)

## 2011-05-17 LAB — LIPID PANEL
HDL: 37.6 mg/dL — ABNORMAL LOW (ref >39.00)
Total CHOL/HDL Ratio: 13
VLDL: 377.6 mg/dL — ABNORMAL HIGH (ref 0.0–40.0)

## 2011-05-17 LAB — HEPATIC FUNCTION PANEL
AST: 37 U/L (ref 0–37)
Albumin: 4 g/dL (ref 3.5–5.2)
Alkaline Phosphatase: 64 U/L (ref 39–117)
Bilirubin, Direct: 0.1 mg/dL (ref 0.0–0.3)
Total Bilirubin: 0.5 mg/dL (ref 0.3–1.2)

## 2011-05-17 LAB — HEMOGLOBIN A1C: Hgb A1c MFr Bld: 8.5 % — ABNORMAL HIGH (ref 4.6–6.5)

## 2011-05-17 LAB — CBC WITH DIFFERENTIAL/PLATELET
Basophils Absolute: 0 10*3/uL (ref 0.0–0.1)
Eosinophils Absolute: 0.2 10*3/uL (ref 0.0–0.7)
HCT: 40.7 % (ref 39.0–52.0)
Lymphs Abs: 2.1 10*3/uL (ref 0.7–4.0)
MCV: 92.8 fl (ref 78.0–100.0)
Monocytes Absolute: 0.6 10*3/uL (ref 0.1–1.0)
Neutrophils Relative %: 56.7 % (ref 43.0–77.0)
Platelets: 189 10*3/uL (ref 150.0–400.0)
RDW: 13.6 % (ref 11.5–14.6)
WBC: 6.6 10*3/uL (ref 4.5–10.5)

## 2011-05-17 LAB — PSA: PSA: 0.33 ng/mL (ref 0.10–4.00)

## 2011-05-17 LAB — MICROALBUMIN / CREATININE URINE RATIO: Microalb Creat Ratio: 1.7 mg/g (ref 0.0–30.0)

## 2011-05-21 ENCOUNTER — Encounter: Payer: Self-pay | Admitting: Endocrinology

## 2011-05-21 ENCOUNTER — Ambulatory Visit (INDEPENDENT_AMBULATORY_CARE_PROVIDER_SITE_OTHER): Payer: 59 | Admitting: Endocrinology

## 2011-05-21 VITALS — BP 122/74 | HR 80 | Temp 98.7°F | Ht 70.0 in | Wt 275.6 lb

## 2011-05-21 DIAGNOSIS — H902 Conductive hearing loss, unspecified: Secondary | ICD-10-CM

## 2011-05-21 DIAGNOSIS — E871 Hypo-osmolality and hyponatremia: Secondary | ICD-10-CM | POA: Insufficient documentation

## 2011-05-21 DIAGNOSIS — E119 Type 2 diabetes mellitus without complications: Secondary | ICD-10-CM

## 2011-05-21 MED ORDER — METRONIDAZOLE 0.75 % EX CREA: TOPICAL_CREAM | Freq: Two times a day (BID) | CUTANEOUS | Status: AC

## 2011-05-21 MED ORDER — INSULIN LISPRO 100 UNIT/ML ~~LOC~~ SOLN: 3.0000 [IU] | Freq: Three times a day (TID) | SUBCUTANEOUS | Status: DC

## 2011-05-21 MED ORDER — GLUCOSE BLOOD VI STRP: 1.0000 | ORAL_STRIP | Freq: Two times a day (BID) | Status: DC

## 2011-05-21 NOTE — Patient Instructions (Addendum)
The best treatments for the abnormalities on your blood test are weight loss surgery and insulin.  It is very important to avoid alcohol altogether. good diet and exercise habits significanly improve the control of your diabetes.  please let me know if you wish to be referred to a dietician, or for weight-loss surgery.  high blood sugar is very risky to your health.  you should see an eye doctor every year. controlling your blood pressure and cholesterol drastically reduces the damage diabetes does to your body.  this also applies to quitting smoking.  please discuss these with your doctor.  you should take an aspirin every day, unless you have been advised by a doctor not to. Please come back for a follow-up appointment in a few weeks.   check your blood sugar 2 times a day.  vary the time of day when you check, between before the 3 meals, and at bedtime.  also check if you have symptoms of your blood sugar being too high or too low.  please keep a record of the readings and bring it to your next appointment here.  please call us sooner if you are having low blood sugar episodes, or if it stays over 200.  i have sent a prescription to your pharmacy, to start humalog 3 units 3x a day (just before each meal).   Continue diabetes pills for now. Also, please consider these measures for your health:  do not use tobacco products.  have a colonoscopy at least every 10 years from age 84.   keep firearms safely stored.  always use seat belts.  have working smoke alarms in your home.  see an eye doctor and dentist regularly.  never drive under the influence of alcohol or drugs (including prescription drugs).  those with fair skin should take precautions against the sun. i have sent a prescription to your pharmacy, for metronidazole cream, for your face.

## 2011-05-21 NOTE — Progress Notes (Signed)
Subjective:    Patient ID: Larry Rose, male    DOB: 11/09/1965, 45 y.o.   MRN: [id number]  HPI here for regular wellness examination.  He had a rod placed into the right forearm, due to an industrial accident. chronic med probs are stable, except as noted below. Past Medical History  Diagnosis Date  . TINEA CRURIS 11/07/2008  . DIABETES MELLITUS, TYPE II 04/01/2007  . HYPERLIPIDEMIA, ATHEROGENIC 09/29/2007  . ALCOHOLISM 11/30/2009  . CARPAL TUNNEL SYNDROME, RIGHT 10/06/2008  . HYPERTENSION 04/01/2007  . ALLERGIC RHINITIS 04/01/2007  . Rosacea 05/14/2010  . Hepatic steatosis     No past surgical history on file.  History   Social History  . Marital Status: Married    Spouse Name: N/A    Number of Children: N/A  . Years of Education: N/A   Occupational History  . Works Quarry manager    Social History Main Topics  . Smoking status: Never Smoker   . Smokeless tobacco: Not on file  . Alcohol Use: Not on file  . Drug Use: Not on file  . Sexually Active: Not on file   Other Topics Concern  . Not on file   Social History Narrative  . No narrative on file    Current Outpatient Prescriptions on File Prior to Visit  Medication Sig Dispense Refill  . ACTOPLUS MET 15-500 MG per tablet TAKE 2 TABLTES BY MOUTH IN THE MORNING AND 1 TABLET IN THE EVENING  90 tablet  0  . clotrimazole-betamethasone (LOTRISONE) cream Apply topically 2 (two) times daily as needed. For rash       . lisinopril-hydrochlorothiazide (PRINZIDE,ZESTORETIC) 20-12.5 MG per tablet TAKE 1 TABLET BY MOUTH EVERY DAY  30 tablet  2  . Multiple Vitamin (MULTIVITAMIN) tablet Take 1 tablet by mouth daily.        . bromocriptine (PARLODEL) 2.5 MG tablet 1/2 tablet at bedtime         Allergies  Allergen Reactions  . Enalapril Maleate   . Lovastatin     Family History  Problem Relation Age of Onset  . Cancer Neg Hx     BP 122/74  Pulse 80  Temp(Src) 98.7 F (37.1 C) (Oral)  Ht 5\' 10"  (1.778  m)  Wt 275 lb 9.6 oz (125.011 kg)  BMI 39.54 kg/m2  SpO2 97%  Review of Systems  Constitutional: Negative for fever.  HENT: Negative for ear pain.   Eyes: Negative for visual disturbance.  Respiratory: Negative for shortness of breath.   Cardiovascular: Negative for chest pain.  Gastrointestinal: Negative for abdominal pain.  Genitourinary: Negative for scrotal swelling and testicular pain.  Musculoskeletal: Negative for back pain.  Skin: Negative for wound.  Neurological: Negative for syncope.  Hematological: Does not bruise/bleed easily.  Psychiatric/Behavioral: Negative for dysphoric mood.      Objective:   Physical Exam VS: see vs page GEN: no distress HEAD: head: no deformity eyes: no periorbital swelling, no proptosis external nose and ears are normal mouth: no lesion seen NECK: supple, thyroid is not enlarged CHEST WALL: no deformity LUNGS: clear to auscultation BREASTS:  No gynecomastia CV: reg rate and rhythm, no murmur ABD: abdomen is soft, nontender.  no hepatosplenomegaly.  not distended.  no hernia MUSCULOSKELETAL: muscle bulk and strength are grossly normal.  no obvious joint swelling.  gait is normal and steady PULSES: dorsalis pedis intact bilat.  no carotid bruit NEURO:  cn 2-12 grossly intact.   readily moves all 4's.  sensation is  intact to touch on the feet SKIN:  Normal texture and temperature.  No suspicious lesion is visible.   NODES:  None palpable at the neck PSYCH: alert, oriented x3.  Does not appear anxious nor depressed.     Assessment & Plan:  Wellness visit today, with problems stable, except as noted.    SEPARATE EVALUATION FOLLOWS--EACH PROBLEM HERE IS NEW, NOT RESPONDING TO TREATMENT, OR POSES SIGNIFICANT RISK TO THE PATIENT'S HEALTH: HISTORY OF THE PRESENT ILLNESS: Pt says he "drinks more than i should."   Pt states few year of moderate rash on the face, but no assoc itching PAST MEDICAL HISTORY reviewed and up to date  today REVIEW OF SYSTEMS: Pt reports weight gain.  Denies numbness PHYSICAL EXAMINATION: VITAL SIGNS:  See vs page GENERAL: no distress.  Obese Skin: moderate red rash at the malar areas Feet: no deformity.  no ulcer on the feet.  feet are of normal color and temp.  trace bilat leg edema LAB/XRAY RESULTS: Lab Results  Component Value Date   ALT 60* 05/17/2011   AST 37 05/17/2011   ALKPHOS 64 05/17/2011   BILITOT 0.5 05/17/2011   Lab Results  Component Value Date   CHOL 492* 05/17/2011   CHOL 291* 05/14/2010   CHOL 340* 11/28/2009   Lab Results  Component Value Date   HDL 37.60* 05/17/2011   HDL 40.80 05/14/2010   HDL 40.98 11/28/2009   No results found for this basename: [facility]   Lab Results  Component Value Date   TRIG 1888.0* 05/17/2011   TRIG 394.0* 05/14/2010   TRIG 773.0* 11/28/2009   Lab Results  Component Value Date   CHOLHDL 13 05/17/2011   CHOLHDL 7 05/14/2010   CHOLHDL 7 11/28/2009   Lab Results  Component Value Date   LDLDIRECT 136.5 Lipemic 05/17/2011   LDLDIRECT 153.3 05/14/2010   LDLDIRECT 131.8 11/28/2009   Lab Results  Component Value Date   HGBA1C 8.5* 05/17/2011  IMPRESSION: Dm, needs increased rx Hepatic steatosis, prob exac by etoh Dyslipidemia, prob exac by etoh Rosacea, recurrent PLAN: See instruction page

## 2011-06-21 ENCOUNTER — Ambulatory Visit (INDEPENDENT_AMBULATORY_CARE_PROVIDER_SITE_OTHER): Payer: Worker's Compensation | Admitting: Endocrinology

## 2011-06-21 ENCOUNTER — Encounter: Payer: Self-pay | Admitting: Endocrinology

## 2011-06-21 DIAGNOSIS — E119 Type 2 diabetes mellitus without complications: Secondary | ICD-10-CM

## 2011-06-21 NOTE — Patient Instructions (Addendum)
stop actos + met.   Increase the insulin to 7 units 3x a day (just before each meal).  Then continue to increase, if necessary, to get the blood sugar, at some time of day, down the the low-100's. Please come back for a follow-up appointment for 1 month.  Please make an appointment.  check your blood sugar 2 times a day.  vary the time of day when you check, between before the 3 meals, and at bedtime.  also check if you have symptoms of your blood sugar being too high or too low.  please keep a record of the readings and bring it to your next appointment here.  please call us sooner if you are having low blood sugar episodes, or if it stays over 200.

## 2011-06-21 NOTE — Progress Notes (Signed)
  Subjective:    Patient ID: Larry Rose, male    DOB: [birth year], 45 y.o.   MRN: [id number]  HPI On the 3 units tid, cbg's are 200-300.  There is no trend throughout the day.  He has lost weight, due to his efforts Past Medical History  Diagnosis Date  . TINEA CRURIS 11/07/2008  . DIABETES MELLITUS, TYPE II 04/01/2007  . HYPERLIPIDEMIA, ATHEROGENIC 09/29/2007  . ALCOHOLISM 11/30/2009  . CARPAL TUNNEL SYNDROME, RIGHT 10/06/2008  . HYPERTENSION 04/01/2007  . ALLERGIC RHINITIS 04/01/2007  . Rosacea 05/14/2010  . Hepatic steatosis     No past surgical history on file.  History   Social History  . Marital Status: Married    Spouse Name: N/A    Number of Children: N/A  . Years of Education: N/A   Occupational History  . Works Quarry manager    Social History Main Topics  . Smoking status: Never Smoker   . Smokeless tobacco: Not on file  . Alcohol Use: Not on file  . Drug Use: Not on file  . Sexually Active: Not on file   Other Topics Concern  . Not on file   Social History Narrative  . No narrative on file    Current Outpatient Prescriptions on File Prior to Visit  Medication Sig Dispense Refill  . clotrimazole-betamethasone (LOTRISONE) cream Apply topically 2 (two) times daily as needed. For rash       . glucose blood (ONE TOUCH ULTRA TEST) test strip 1 each by Other route 2 (two) times daily. And lancets 2/day 250.01  100 each  12  . lisinopril-hydrochlorothiazide (PRINZIDE,ZESTORETIC) 20-12.5 MG per tablet TAKE 1 TABLET BY MOUTH EVERY DAY  30 tablet  2  . metroNIDAZOLE (METROCREAM) 0.75 % cream Apply topically 2 (two) times daily.  45 g  3  . Multiple Vitamin (MULTIVITAMIN) tablet Take 1 tablet by mouth daily.          Allergies  Allergen Reactions  . Enalapril Maleate   . Lovastatin     Family History  Problem Relation Age of Onset  . Cancer Neg Hx     BP 112/66  Pulse 65  Temp(Src) 98.8 F (37.1 C) (Oral)  Ht 5' 9.5" (1.765 m)  Wt 269 lb  (122.018 kg)  BMI 39.15 kg/m2  SpO2 96%  Review of Systems denies hypoglycemia    Objective:   Physical Exam VITAL SIGNS:  See vs page GENERAL: no distress.  Obese PSYCH: Alert and oriented x 3.  Does not appear anxious nor depressed.        Assessment & Plan:  Dm, needs increased rx

## 2011-07-19 ENCOUNTER — Telehealth: Payer: Self-pay

## 2011-07-19 NOTE — Telephone Encounter (Signed)
Pt advised and OV scheduled.

## 2011-07-19 NOTE — Telephone Encounter (Signed)
Increase to 12 units tid (qac). Ret 1-2 weeks

## 2011-07-19 NOTE — Telephone Encounter (Signed)
Pt called stating his CBG are still elevated (300+) with 7 units of Humalog, please advise.

## 2011-07-19 NOTE — Telephone Encounter (Signed)
Pt informed of MD's advisement, appointment scheduled 08/01/2011.

## 2011-07-23 ENCOUNTER — Other Ambulatory Visit: Payer: Self-pay | Admitting: Endocrinology

## 2011-07-24 ENCOUNTER — Ambulatory Visit: Payer: Self-pay | Admitting: Endocrinology

## 2011-08-01 ENCOUNTER — Encounter: Payer: Self-pay | Admitting: Endocrinology

## 2011-08-01 ENCOUNTER — Ambulatory Visit (INDEPENDENT_AMBULATORY_CARE_PROVIDER_SITE_OTHER): Payer: 59 | Admitting: Endocrinology

## 2011-08-01 DIAGNOSIS — E119 Type 2 diabetes mellitus without complications: Secondary | ICD-10-CM

## 2011-08-01 MED ORDER — INSULIN LISPRO 100 UNIT/ML ~~LOC~~ SOLN: SUBCUTANEOUS | Status: DC

## 2011-08-01 NOTE — Patient Instructions (Signed)
Increase humalog to 3x a day (just before each meal) 20-12-20.   check your blood sugar 2 times a day.  vary the time of day when you check, between before the 3 meals, and at bedtime.  also check if you have symptoms of your blood sugar being too high or too low.  please keep a record of the readings and bring it to your next appointment here.  please call us sooner if your blood sugar goes below 70, or if it stays over 200. Please come back for a follow-up appointment for 1 month.  Please make an appointment.

## 2011-08-01 NOTE — Progress Notes (Signed)
  Subjective:    Patient ID: Larry Rose, male    DOB: 1966-06-05, 45 y.o.   MRN: [id number]  HPI Pt returns for f/u of insulin-requiring DM (1993).  Despite increasing the humalog to 12 units tid (qac), cbg's continue to vary from 187-420.  It is in general lowest in the afternoon.  It is highest in am (he does notc heck at hs).   Past Medical History  Diagnosis Date  . TINEA CRURIS 11/07/2008  . DIABETES MELLITUS, TYPE II 04/01/2007  . HYPERLIPIDEMIA, ATHEROGENIC 09/29/2007  . ALCOHOLISM 11/30/2009  . CARPAL TUNNEL SYNDROME, RIGHT 10/06/2008  . HYPERTENSION 04/01/2007  . ALLERGIC RHINITIS 04/01/2007  . Rosacea 05/14/2010  . Hepatic steatosis     No past surgical history on file.  History   Social History  . Marital Status: Married    Spouse Name: N/A    Number of Children: N/A  . Years of Education: N/A   Occupational History  . Works Quarry manager    Social History Main Topics  . Smoking status: Never Smoker   . Smokeless tobacco: Not on file  . Alcohol Use: Not on file  . Drug Use: Not on file  . Sexually Active: Not on file   Other Topics Concern  . Not on file   Social History Narrative  . No narrative on file    Current Outpatient Prescriptions on File Prior to Visit  Medication Sig Dispense Refill  . clotrimazole-betamethasone (LOTRISONE) cream Apply topically 2 (two) times daily as needed. For rash       . glucose blood (ONE TOUCH ULTRA TEST) test strip 1 each by Other route 2 (two) times daily. And lancets 2/day 250.01  100 each  12  . lisinopril-hydrochlorothiazide (PRINZIDE,ZESTORETIC) 20-12.5 MG per tablet TAKE 1 TABLET BY MOUTH EVERY DAY  30 tablet  11  . metroNIDAZOLE (METROCREAM) 0.75 % cream Apply topically 2 (two) times daily.  45 g  3  . Multiple Vitamin (MULTIVITAMIN) tablet Take 1 tablet by mouth daily.          Allergies  Allergen Reactions  . Enalapril Maleate   . Lovastatin     Family History  Problem Relation Age of Onset   . Cancer Neg Hx     BP 114/72  Pulse 70  Temp(Src) 98.4 F (36.9 C) (Oral)  Ht 5\' 9"  (1.753 m)  Wt 263 lb 12.8 oz (119.659 kg)  BMI 38.96 kg/m2  SpO2 96%  Review of Systems denies hypoglycemia    Objective:   Physical Exam VITAL SIGNS:  See vs page GENERAL: no distress SKIN:  Insulin injection sites at the anterior abdomen are normal, except for a few ecchymoses.     Assessment & Plan:  DM, needs increased rx, due to the waning effect of the actos

## 2011-08-16 ENCOUNTER — Other Ambulatory Visit: Payer: Self-pay | Admitting: Endocrinology

## 2011-08-30 ENCOUNTER — Encounter: Payer: Self-pay | Admitting: Endocrinology

## 2011-08-30 ENCOUNTER — Ambulatory Visit (INDEPENDENT_AMBULATORY_CARE_PROVIDER_SITE_OTHER): Payer: 59 | Admitting: Endocrinology

## 2011-08-30 DIAGNOSIS — E119 Type 2 diabetes mellitus without complications: Secondary | ICD-10-CM

## 2011-08-30 NOTE — Patient Instructions (Addendum)
Increase humalog to 3x a day (just before each meal) 20-12-30.   check your blood sugar 2 times a day.  vary the time of day when you check, between before the 3 meals, and at bedtime.  also check if you have symptoms of your blood sugar being too high or too low.  please keep a record of the readings and bring it to your next appointment here.  please call us sooner if your blood sugar goes below 70, or if it stays over 200. Please come back for a follow-up appointment for 1 month.  Please make an appointment.

## 2011-08-30 NOTE — Progress Notes (Signed)
  Subjective:    Patient ID: Larry Rose, male    DOB: 1966/01/04, 46 y.o.   MRN: [id number]  HPI Pt returns for f/u of insulin-requiring DM (1993).   pt states he feels well in general.  no cbg record, but states cbg's are still approx 200.  It is highest at hs, and lowest before lunch.   Past Medical History  Diagnosis Date  . TINEA CRURIS 11/07/2008  . DIABETES MELLITUS, TYPE II 04/01/2007  . HYPERLIPIDEMIA, ATHEROGENIC 09/29/2007  . ALCOHOLISM 11/30/2009  . CARPAL TUNNEL SYNDROME, RIGHT 10/06/2008  . HYPERTENSION 04/01/2007  . ALLERGIC RHINITIS 04/01/2007  . Rosacea 05/14/2010  . Hepatic steatosis     No past surgical history on file.  History   Social History  . Marital Status: Married    Spouse Name: N/A    Number of Children: N/A  . Years of Education: N/A   Occupational History  . Works Quarry manager    Social History Main Topics  . Smoking status: Never Smoker   . Smokeless tobacco: Not on file  . Alcohol Use: Not on file  . Drug Use: Not on file  . Sexually Active: Not on file   Other Topics Concern  . Not on file   Social History Narrative  . No narrative on file    Current Outpatient Prescriptions on File Prior to Visit  Medication Sig Dispense Refill  . B-D ULTRAFINE III SHORT PEN 31G X 8 MM MISC USE AS DIRECTED THREE TIMES DAILY  100 each  5  . clotrimazole-betamethasone (LOTRISONE) cream Apply topically 2 (two) times daily as needed. For rash       . glucose blood (ONE TOUCH ULTRA TEST) test strip 1 each by Other route 2 (two) times daily. And lancets 2/day 250.01  100 each  12  . lisinopril-hydrochlorothiazide (PRINZIDE,ZESTORETIC) 20-12.5 MG per tablet TAKE 1 TABLET BY MOUTH EVERY DAY  30 tablet  11  . metroNIDAZOLE (METROCREAM) 0.75 % cream Apply topically 2 (two) times daily.  45 g  3  . Multiple Vitamin (MULTIVITAMIN) tablet Take 1 tablet by mouth daily.          Allergies  Allergen Reactions  . Enalapril Maleate   . Lovastatin       Family History  Problem Relation Age of Onset  . Cancer Neg Hx     BP 118/78  Pulse 72  Temp(Src) 98.4 F (36.9 C) (Oral)  Ht 5\' 9"  (1.753 m)  Wt 266 lb 12.8 oz (121.02 kg)  BMI 39.40 kg/m2  SpO2 96%   Review of Systems denies hypoglycemia    Objective:   Physical Exam VITAL SIGNS:  See vs page GENERAL: no distress PSYCH: Alert and oriented x 3.  Does not appear anxious nor depressed.        Assessment & Plan:  Dm, needs increased rx

## 2011-10-02 ENCOUNTER — Telehealth: Payer: Self-pay

## 2011-10-02 NOTE — Telephone Encounter (Signed)
Pt called requesting a call back regarding elevated blood sugars. Per pt, he has F/U OV schd but he does not think he will be able to make it. Pt encouraged to come in for OV but to also call back for specific information regarding CBG readings.

## 2011-10-04 ENCOUNTER — Ambulatory Visit (INDEPENDENT_AMBULATORY_CARE_PROVIDER_SITE_OTHER): Payer: 59 | Admitting: Endocrinology

## 2011-10-04 ENCOUNTER — Encounter: Payer: Self-pay | Admitting: Endocrinology

## 2011-10-04 DIAGNOSIS — E119 Type 2 diabetes mellitus without complications: Secondary | ICD-10-CM

## 2011-10-04 MED ORDER — INSULIN LISPRO 100 UNIT/ML ~~LOC~~ SOLN: SUBCUTANEOUS | Status: DC

## 2011-10-04 NOTE — Progress Notes (Signed)
  Subjective:    Patient ID: Larry Rose, male    DOB: 07/28/1966, 46 y.o.   MRN: [id number]  HPI Pt returns for f/u of insulin-requiring DM (1993). pt states he feels well in general. no cbg record, but states cbg's vary from 140-350. It is highest at hs, and lowest in the afternoon.  Past Medical History  Diagnosis Date  . TINEA CRURIS 11/07/2008  . DIABETES MELLITUS, TYPE II 04/01/2007  . HYPERLIPIDEMIA, ATHEROGENIC 09/29/2007  . ALCOHOLISM 11/30/2009  . CARPAL TUNNEL SYNDROME, RIGHT 10/06/2008  . HYPERTENSION 04/01/2007  . ALLERGIC RHINITIS 04/01/2007  . Rosacea 05/14/2010  . Hepatic steatosis     No past surgical history on file.  History   Social History  . Marital Status: Married    Spouse Name: N/A    Number of Children: N/A  . Years of Education: N/A   Occupational History  . Works Quarry manager    Social History Main Topics  . Smoking status: Never Smoker   . Smokeless tobacco: Not on file  . Alcohol Use: Not on file  . Drug Use: Not on file  . Sexually Active: Not on file   Other Topics Concern  . Not on file   Social History Narrative  . No narrative on file    Current Outpatient Prescriptions on File Prior to Visit  Medication Sig Dispense Refill  . B-D ULTRAFINE III SHORT PEN 31G X 8 MM MISC USE AS DIRECTED THREE TIMES DAILY  100 each  5  . clotrimazole-betamethasone (LOTRISONE) cream Apply topically 2 (two) times daily as needed. For rash       . glucose blood (ONE TOUCH ULTRA TEST) test strip 1 each by Other route 2 (two) times daily. And lancets 2/day 250.01  100 each  12  . lisinopril-hydrochlorothiazide (PRINZIDE,ZESTORETIC) 20-12.5 MG per tablet TAKE 1 TABLET BY MOUTH EVERY DAY  30 tablet  11  . metroNIDAZOLE (METROCREAM) 0.75 % cream Apply topically 2 (two) times daily.  45 g  3  . Multiple Vitamin (MULTIVITAMIN) tablet Take 1 tablet by mouth daily.          Allergies  Allergen Reactions  . Enalapril Maleate   . Lovastatin      Family History  Problem Relation Age of Onset  . Cancer Neg Hx     BP 110/64  Pulse 74  Temp(Src) 97.5 F (36.4 C) (Oral)  Ht 5\' 9"  (1.753 m)  Wt 260 lb 12.8 oz (118.298 kg)  BMI 38.51 kg/m2  SpO2 95%   Review of Systems denies hypoglycemia    Objective:   Physical Exam VITAL SIGNS:  See vs page GENERAL: no distress SKIN:  Insulin injection sites at the anterior abdomen are normal, except for a few ecchymoses.         Assessment & Plan:  DM, needs increased rx

## 2011-10-04 NOTE — Patient Instructions (Addendum)
Increase humalog to 3x a day (just before each meal) 20-12-45.   check your blood sugar 2 times a day.  vary the time of day when you check, between before the 3 meals, and at bedtime.  also check if you have symptoms of your blood sugar being too high or too low.  please keep a record of the readings and bring it to your next appointment here.  please call us sooner if your blood sugar goes below 70, or if it stays over 200.   Please come back for a follow-up appointment for 1 month.  Please make an appointment.

## 2011-10-04 NOTE — Telephone Encounter (Signed)
Pt came in for OV 10/04/2011

## 2011-10-04 NOTE — Telephone Encounter (Signed)
Left message on machine for pt to return my call  

## 2011-10-18 ENCOUNTER — Telehealth: Payer: Self-pay

## 2011-10-18 NOTE — Telephone Encounter (Signed)
Pt called to report his CBGs; 280-160 at bedtime, 190-250 in the morning, in the afternoon 250+. Please advise in Dr George Hugh absence.

## 2011-10-18 NOTE — Telephone Encounter (Signed)
Pt informed and understood adjustment

## 2011-10-18 NOTE — Telephone Encounter (Signed)
Ok to change humalog to 24-15-46  Zella Ball to notify pt, and adjsut med list

## 2011-10-28 ENCOUNTER — Encounter: Payer: Self-pay | Admitting: Endocrinology

## 2011-10-28 ENCOUNTER — Ambulatory Visit (INDEPENDENT_AMBULATORY_CARE_PROVIDER_SITE_OTHER): Payer: 59 | Admitting: Endocrinology

## 2011-10-28 VITALS — BP 118/62 | HR 62 | Temp 98.2°F | Ht 69.0 in | Wt 261.0 lb

## 2011-10-28 DIAGNOSIS — E119 Type 2 diabetes mellitus without complications: Secondary | ICD-10-CM

## 2011-10-28 MED ORDER — INSULIN LISPRO 100 UNIT/ML ~~LOC~~ SOLN: SUBCUTANEOUS | Status: DC

## 2011-10-28 NOTE — Patient Instructions (Signed)
Increase humalog to 3x a day (just before each meal)  25-15-60.   check your blood sugar 2 times a day.  vary the time of day when you check, between before the 3 meals, and at bedtime.  also check if you have symptoms of your blood sugar being too high or too low.  please keep a record of the readings and bring it to your next appointment here.  please call us sooner if your blood sugar goes below 70, or if it stays over 200.   Please come back for a follow-up appointment for 1 month.

## 2011-10-28 NOTE — Progress Notes (Signed)
  Subjective:    Patient ID: Larry Rose, male    DOB: 1965/11/27, 46 y.o.   MRN: [id number]  HPI Pt returns for f/u of insulin-requiring DM (1993). pt states he feels well in general. no cbg record, but states cbg's vary from 140-350. It is highest at hs, and lowest in the afternoon.  Since last ov, he call and said cbg was still high, so humalog was raised to qac (tid) 24-15-46 units.  despite this increaase, cbg's continue to vary from 140-245.  It is still highest at hs, and in am.  It is lowest in the afternoon.   Past Medical History  Diagnosis Date  . TINEA CRURIS 11/07/2008  . DIABETES MELLITUS, TYPE II 04/01/2007  . HYPERLIPIDEMIA, ATHEROGENIC 09/29/2007  . ALCOHOLISM 11/30/2009  . CARPAL TUNNEL SYNDROME, RIGHT 10/06/2008  . HYPERTENSION 04/01/2007  . ALLERGIC RHINITIS 04/01/2007  . Rosacea 05/14/2010  . Hepatic steatosis     No past surgical history on file.  History   Social History  . Marital Status: Married    Spouse Name: N/A    Number of Children: N/A  . Years of Education: N/A   Occupational History  . Works Quarry manager    Social History Main Topics  . Smoking status: Never Smoker   . Smokeless tobacco: Not on file  . Alcohol Use: Not on file  . Drug Use: Not on file  . Sexually Active: Not on file   Other Topics Concern  . Not on file   Social History Narrative  . No narrative on file    Current Outpatient Prescriptions on File Prior to Visit  Medication Sig Dispense Refill  . B-D ULTRAFINE III SHORT PEN 31G X 8 MM MISC USE AS DIRECTED THREE TIMES DAILY  100 each  5  . clotrimazole-betamethasone (LOTRISONE) cream Apply topically 2 (two) times daily as needed. For rash       . glucose blood (ONE TOUCH ULTRA TEST) test strip 1 each by Other route 2 (two) times daily. And lancets 2/day 250.01  100 each  12  . lisinopril-hydrochlorothiazide (PRINZIDE,ZESTORETIC) 20-12.5 MG per tablet TAKE 1 TABLET BY MOUTH EVERY DAY  30 tablet  11  .  metroNIDAZOLE (METROCREAM) 0.75 % cream Apply topically 2 (two) times daily.  45 g  3  . Multiple Vitamin (MULTIVITAMIN) tablet Take 1 tablet by mouth daily.          Allergies  Allergen Reactions  . Enalapril Maleate   . Lovastatin     Family History  Problem Relation Age of Onset  . Cancer Neg Hx     BP 118/62  Pulse 62  Temp(Src) 98.2 F (36.8 C) (Oral)  Ht 5\' 9"  (1.753 m)  Wt 261 lb (118.389 kg)  BMI 38.54 kg/m2  SpO2 97%   Review of Systems denies hypoglycemia    Objective:   Physical Exam VITAL SIGNS:  See vs page GENERAL: no distress.  Obese PSYCH: Alert and oriented x 3.  Does not appear anxious nor depressed.        Assessment & Plan:  DM, needs increased rx

## 2011-11-28 ENCOUNTER — Ambulatory Visit (INDEPENDENT_AMBULATORY_CARE_PROVIDER_SITE_OTHER): Payer: 59 | Admitting: Endocrinology

## 2011-11-28 ENCOUNTER — Encounter: Payer: Self-pay | Admitting: Endocrinology

## 2011-11-28 VITALS — BP 118/60 | HR 75 | Temp 98.2°F | Ht 68.0 in | Wt 254.8 lb

## 2011-11-28 DIAGNOSIS — E119 Type 2 diabetes mellitus without complications: Secondary | ICD-10-CM

## 2011-11-28 MED ORDER — INSULIN LISPRO 100 UNIT/ML ~~LOC~~ SOLN: SUBCUTANEOUS | Status: DC

## 2011-11-28 NOTE — Patient Instructions (Addendum)
Increase humalog to 3x a day (just before each meal)  30-25-65 units.   check your blood sugar 2 times a day.  vary the time of day when you check, between before the 3 meals, and at bedtime.  also check if you have symptoms of your blood sugar being too high or too low.  please keep a record of the readings and bring it to your next appointment here.  please call us sooner if your blood sugar goes below 70, or if it stays over 200.   Please come back for a follow-up appointment for 1 month.

## 2011-11-28 NOTE — Progress Notes (Signed)
  Subjective:    Patient ID: Larry Rose, male    DOB: Jan 07, 1966, 46 y.o.   MRN: [id number]  HPI Pt returns for f/u of insulin-requiring DM (1993). pt states he feels well in general.  He is now on 3rd shift, for 2 weeks only.  no cbg record, but states cbg's are 144-300.  Prior to the 3rd shift, cbg's were highest in am, and the afternoon.   Past Medical History  Diagnosis Date  . TINEA CRURIS 11/07/2008  . DIABETES MELLITUS, TYPE II 04/01/2007  . HYPERLIPIDEMIA, ATHEROGENIC 09/29/2007  . ALCOHOLISM 11/30/2009  . CARPAL TUNNEL SYNDROME, RIGHT 10/06/2008  . HYPERTENSION 04/01/2007  . ALLERGIC RHINITIS 04/01/2007  . Rosacea 05/14/2010  . Hepatic steatosis     No past surgical history on file.  History   Social History  . Marital Status: Married    Spouse Name: N/A    Number of Children: N/A  . Years of Education: N/A   Occupational History  . Works Quarry manager    Social History Main Topics  . Smoking status: Never Smoker   . Smokeless tobacco: Not on file  . Alcohol Use: Not on file  . Drug Use: Not on file  . Sexually Active: Not on file   Other Topics Concern  . Not on file   Social History Narrative  . No narrative on file    Current Outpatient Prescriptions on File Prior to Visit  Medication Sig Dispense Refill  . B-D ULTRAFINE III SHORT PEN 31G X 8 MM MISC USE AS DIRECTED THREE TIMES DAILY  100 each  5  . glucose blood (ONE TOUCH ULTRA TEST) test strip 1 each by Other route 2 (two) times daily. And lancets 2/day 250.01  100 each  12  . lisinopril-hydrochlorothiazide (PRINZIDE,ZESTORETIC) 20-12.5 MG per tablet TAKE 1 TABLET BY MOUTH EVERY DAY  30 tablet  11  . metroNIDAZOLE (METROCREAM) 0.75 % cream Apply topically 2 (two) times daily.  45 g  3  . Multiple Vitamin (MULTIVITAMIN) tablet Take 1 tablet by mouth daily.        . clotrimazole-betamethasone (LOTRISONE) cream Apply topically 2 (two) times daily as needed. For rash         Allergies    Allergen Reactions  . Enalapril Maleate   . Lovastatin    Family History  Problem Relation Age of Onset  . Cancer Neg Hx    BP 118/60  Pulse 75  Temp(Src) 98.2 F (36.8 C) (Oral)  Ht 5\' 8"  (1.727 m)  Wt 254 lb 12.8 oz (115.577 kg)  BMI 38.74 kg/m2  SpO2 96%  Review of Systems denies hypoglycemia    Objective:   Physical Exam VITAL SIGNS:  See vs page GENERAL: no distress Feet: no deformity. no ulcer on the feet. feet are of normal color and temp. no edema Pulses: dorsalis pedis intact bilat.   Neuro: sensation is intact to touch on the feet    Assessment & Plan:  DM.  He may be indicating he needs basal insulin also, but because he is temporarily on 3rd shift, we'll hold-off for now.

## 2011-12-30 ENCOUNTER — Ambulatory Visit (INDEPENDENT_AMBULATORY_CARE_PROVIDER_SITE_OTHER): Payer: 59 | Admitting: Endocrinology

## 2011-12-30 ENCOUNTER — Encounter: Payer: Self-pay | Admitting: Endocrinology

## 2011-12-30 VITALS — BP 108/62 | HR 68 | Temp 97.5°F | Ht 69.0 in | Wt 260.0 lb

## 2011-12-30 DIAGNOSIS — E119 Type 2 diabetes mellitus without complications: Secondary | ICD-10-CM

## 2011-12-30 MED ORDER — INSULIN LISPRO 100 UNIT/ML ~~LOC~~ SOLN: SUBCUTANEOUS | Status: DC

## 2011-12-30 MED ORDER — GLUCOSE BLOOD VI STRP: 1.0000 | ORAL_STRIP | Freq: Two times a day (BID) | Status: DC

## 2011-12-30 NOTE — Patient Instructions (Addendum)
Increase the humalog to 3x a day (just before each meal), 55-45-75 units. check your blood sugar twice a day.  vary the time of day when you check, between before the 3 meals, and at bedtime.  also check if you have symptoms of your blood sugar being too high or too low.  please keep a record of the readings and bring it to your next appointment here.  please call us sooner if your blood sugar goes below 70, or if it stays over 200. Please come back for a follow-up appointment for 1 month.   blood tests are being requested for you today.  You will receive a letter with results.

## 2011-12-30 NOTE — Progress Notes (Signed)
  Subjective:    Patient ID: Larry Rose, male    DOB: 1965-10-26, 46 y.o.   MRN: [id number]  HPI Pt returns for f/u of insulin-requiring DM (dx'ed 1993--no known complications).  prior to a recent steroid injection, cbg's vary from 176-300's.  It is in general higher as the day goes on.  No weight change.   Past Medical History  Diagnosis Date  . TINEA CRURIS 11/07/2008  . DIABETES MELLITUS, TYPE II 04/01/2007  . HYPERLIPIDEMIA, ATHEROGENIC 09/29/2007  . ALCOHOLISM 11/30/2009  . CARPAL TUNNEL SYNDROME, RIGHT 10/06/2008  . HYPERTENSION 04/01/2007  . ALLERGIC RHINITIS 04/01/2007  . Rosacea 05/14/2010  . Hepatic steatosis     No past surgical history on file.  History   Social History  . Marital Status: Married    Spouse Name: N/A    Number of Children: N/A  . Years of Education: N/A   Occupational History  . Works Quarry manager    Social History Main Topics  . Smoking status: Never Smoker   . Smokeless tobacco: Not on file  . Alcohol Use: Not on file  . Drug Use: Not on file  . Sexually Active: Not on file   Other Topics Concern  . Not on file   Social History Narrative  . No narrative on file    Current Outpatient Prescriptions on File Prior to Visit  Medication Sig Dispense Refill  . B-D ULTRAFINE III SHORT PEN 31G X 8 MM MISC USE AS DIRECTED THREE TIMES DAILY  100 each  5  . clotrimazole-betamethasone (LOTRISONE) cream Apply topically 2 (two) times daily as needed. For rash       . lisinopril-hydrochlorothiazide (PRINZIDE,ZESTORETIC) 20-12.5 MG per tablet TAKE 1 TABLET BY MOUTH EVERY DAY  30 tablet  11  . metroNIDAZOLE (METROCREAM) 0.75 % cream Apply topically 2 (two) times daily.  45 g  3  . Multiple Vitamin (MULTIVITAMIN) tablet Take 1 tablet by mouth daily.          Allergies  Allergen Reactions  . Enalapril Maleate   . Lovastatin     Family History  Problem Relation Age of Onset  . Cancer Neg Hx     BP 108/62  Pulse 68  Temp(Src) 97.5  F (36.4 C) (Oral)  Ht 5\' 9"  (1.753 m)  Wt 260 lb (117.935 kg)  BMI 38.40 kg/m2  SpO2 97%  Review of Systems denies hypoglycemia    Objective:   Physical Exam VITAL SIGNS:  See vs page GENERAL: no distress LUNGS:  Clear to auscultation HEART:  Regular rate and rhythm without murmurs noted. Normal S1,S2.         Assessment & Plan:  DM.  needs increased rx

## 2012-01-09 ENCOUNTER — Other Ambulatory Visit: Payer: Self-pay

## 2012-01-09 MED ORDER — CLOTRIMAZOLE-BETAMETHASONE 1-0.05 % EX CREA: TOPICAL_CREAM | Freq: Two times a day (BID) | CUTANEOUS | Status: DC | PRN

## 2012-01-31 ENCOUNTER — Ambulatory Visit: Payer: 59 | Admitting: Endocrinology

## 2012-02-24 ENCOUNTER — Other Ambulatory Visit (INDEPENDENT_AMBULATORY_CARE_PROVIDER_SITE_OTHER): Payer: 59

## 2012-02-24 DIAGNOSIS — E119 Type 2 diabetes mellitus without complications: Secondary | ICD-10-CM

## 2012-02-24 LAB — HEMOGLOBIN A1C: Hgb A1c MFr Bld: 8.7 % — ABNORMAL HIGH (ref 4.6–6.5)

## 2012-02-27 ENCOUNTER — Ambulatory Visit: Payer: 59 | Admitting: Endocrinology

## 2012-02-28 ENCOUNTER — Ambulatory Visit (INDEPENDENT_AMBULATORY_CARE_PROVIDER_SITE_OTHER): Payer: 59 | Admitting: Endocrinology

## 2012-02-28 ENCOUNTER — Encounter: Payer: Self-pay | Admitting: Endocrinology

## 2012-02-28 VITALS — BP 122/72 | HR 64 | Temp 98.3°F | Ht 67.0 in | Wt 266.0 lb

## 2012-02-28 DIAGNOSIS — E119 Type 2 diabetes mellitus without complications: Secondary | ICD-10-CM

## 2012-02-28 MED ORDER — INSULIN LISPRO 100 UNIT/ML ~~LOC~~ SOLN: SUBCUTANEOUS | Status: DC

## 2012-02-28 NOTE — Patient Instructions (Addendum)
Increase the humalog to 3x a day (just before each meal), 80-60-90 units. check your blood sugar twice a day.  vary the time of day when you check, between before the 3 meals, and at bedtime.  also check if you have symptoms of your blood sugar being too high or too low.  please keep a record of the readings and bring it to your next appointment here.  please call us sooner if your blood sugar goes below 70, or if it stays over 200. Please come back for a follow-up appointment for 1 month.

## 2012-02-28 NOTE — Progress Notes (Signed)
  Subjective:    Patient ID: Larry Rose, male    DOB: [birth year], 46 y.o.   MRN: [id number]  HPI Pt returns for f/u of insulin-requiring DM (dx'ed 1993--no known complications).  no cbg record, but states cbg's vary from 150-260.  It is in general higher as the day goes on. Past Medical History  Diagnosis Date  . TINEA CRURIS 11/07/2008  . DIABETES MELLITUS, TYPE II 04/01/2007  . HYPERLIPIDEMIA, ATHEROGENIC 09/29/2007  . ALCOHOLISM 11/30/2009  . CARPAL TUNNEL SYNDROME, RIGHT 10/06/2008  . HYPERTENSION 04/01/2007  . ALLERGIC RHINITIS 04/01/2007  . Rosacea 05/14/2010  . Hepatic steatosis     No past surgical history on file.  History   Social History  . Marital Status: Married    Spouse Name: N/A    Number of Children: N/A  . Years of Education: N/A   Occupational History  . Works Quarry manager    Social History Main Topics  . Smoking status: Never Smoker   . Smokeless tobacco: Not on file  . Alcohol Use: Not on file  . Drug Use: Not on file  . Sexually Active: Not on file   Other Topics Concern  . Not on file   Social History Narrative  . No narrative on file    Current Outpatient Prescriptions on File Prior to Visit  Medication Sig Dispense Refill  . B-D ULTRAFINE III SHORT PEN 31G X 8 MM MISC USE AS DIRECTED THREE TIMES DAILY  100 each  5  . clotrimazole-betamethasone (LOTRISONE) cream Apply topically 2 (two) times daily as needed. For rash  30 g  1  . glucose blood (ONE TOUCH ULTRA TEST) test strip 1 each by Other route 2 (two) times daily. And lancets 2/day 250.01  100 each  12  . lisinopril-hydrochlorothiazide (PRINZIDE,ZESTORETIC) 20-12.5 MG per tablet TAKE 1 TABLET BY MOUTH EVERY DAY  30 tablet  11  . metroNIDAZOLE (METROCREAM) 0.75 % cream Apply topically 2 (two) times daily.  45 g  3  . Multiple Vitamin (MULTIVITAMIN) tablet Take 1 tablet by mouth daily.          Allergies  Allergen Reactions  . Enalapril Maleate   . Lovastatin      Family History  Problem Relation Age of Onset  . Cancer Neg Hx     BP 122/72  Pulse 64  Temp 98.3 F (36.8 C) (Oral)  Ht 5\' 7"  (1.702 m)  Wt 266 lb (120.657 kg)  BMI 41.66 kg/m2  SpO2 96%  Review of Systems denies hypoglycemia    Objective:   Physical Exam VITAL SIGNS:  See vs page GENERAL: no distress Ext: no edema  Lab Results  Component Value Date   HGBA1C 8.7* 02/24/2012      Assessment & Plan:  DM.  needs increased rx

## 2012-04-03 ENCOUNTER — Telehealth: Payer: Self-pay | Admitting: Endocrinology

## 2012-04-03 NOTE — Telephone Encounter (Signed)
Please advise in SAE's absence-he's both PCP and ENDO. Thanks.

## 2012-04-03 NOTE — Telephone Encounter (Signed)
Pt informed via VM to keep appointment next week or to callback for appointment for Saturday clinic.

## 2012-04-03 NOTE — Telephone Encounter (Signed)
Ok for appt Sat AM or with SAE next week - no tx change recommended

## 2012-04-03 NOTE — Telephone Encounter (Signed)
Caller: [name]/Patient; Patient Name: Larry Rose; PCP: Romero Belling; Best Callback Phone Number: [phone]. Called to report still running elevated blood sugars above 200 (averaging 220-260) in the morning and at night.  Onset: approximately 03/03/12. Afebrile.  Notes sweating when blood sugar is "low" at 97.   Fasting blood sugar not checked 04/03/12. Unable to see numbers in glucometer for 04/02/12.  Had right carpet tunnel surgery 03/25/12. Is not on sliding scale insulin.  Takes Humalog Kwikpen 75 units every morning, 80 units in the afternoon, and 100 units before dinner.  Advised to see MD within 4 hours for new or increasing symptoms or glucose out of control as defined by provider and taking medication as prescribed per Diabetes Control Problems. Unable to come for appointment 04/03/12.  Has post op appointment 04/09/12.  States will call back for appointment week of 04/06/12.  Information sent to [facility] per nursing judgement.

## 2012-04-04 IMAGING — CR DG CHEST 1V PORT
1 series · 1 of 1 positions shown · non-contrast
Comparison: None.

CLINICAL DATA: Right forearm injury.

PORTABLE CHEST - 1 VIEW

[view not recorded]
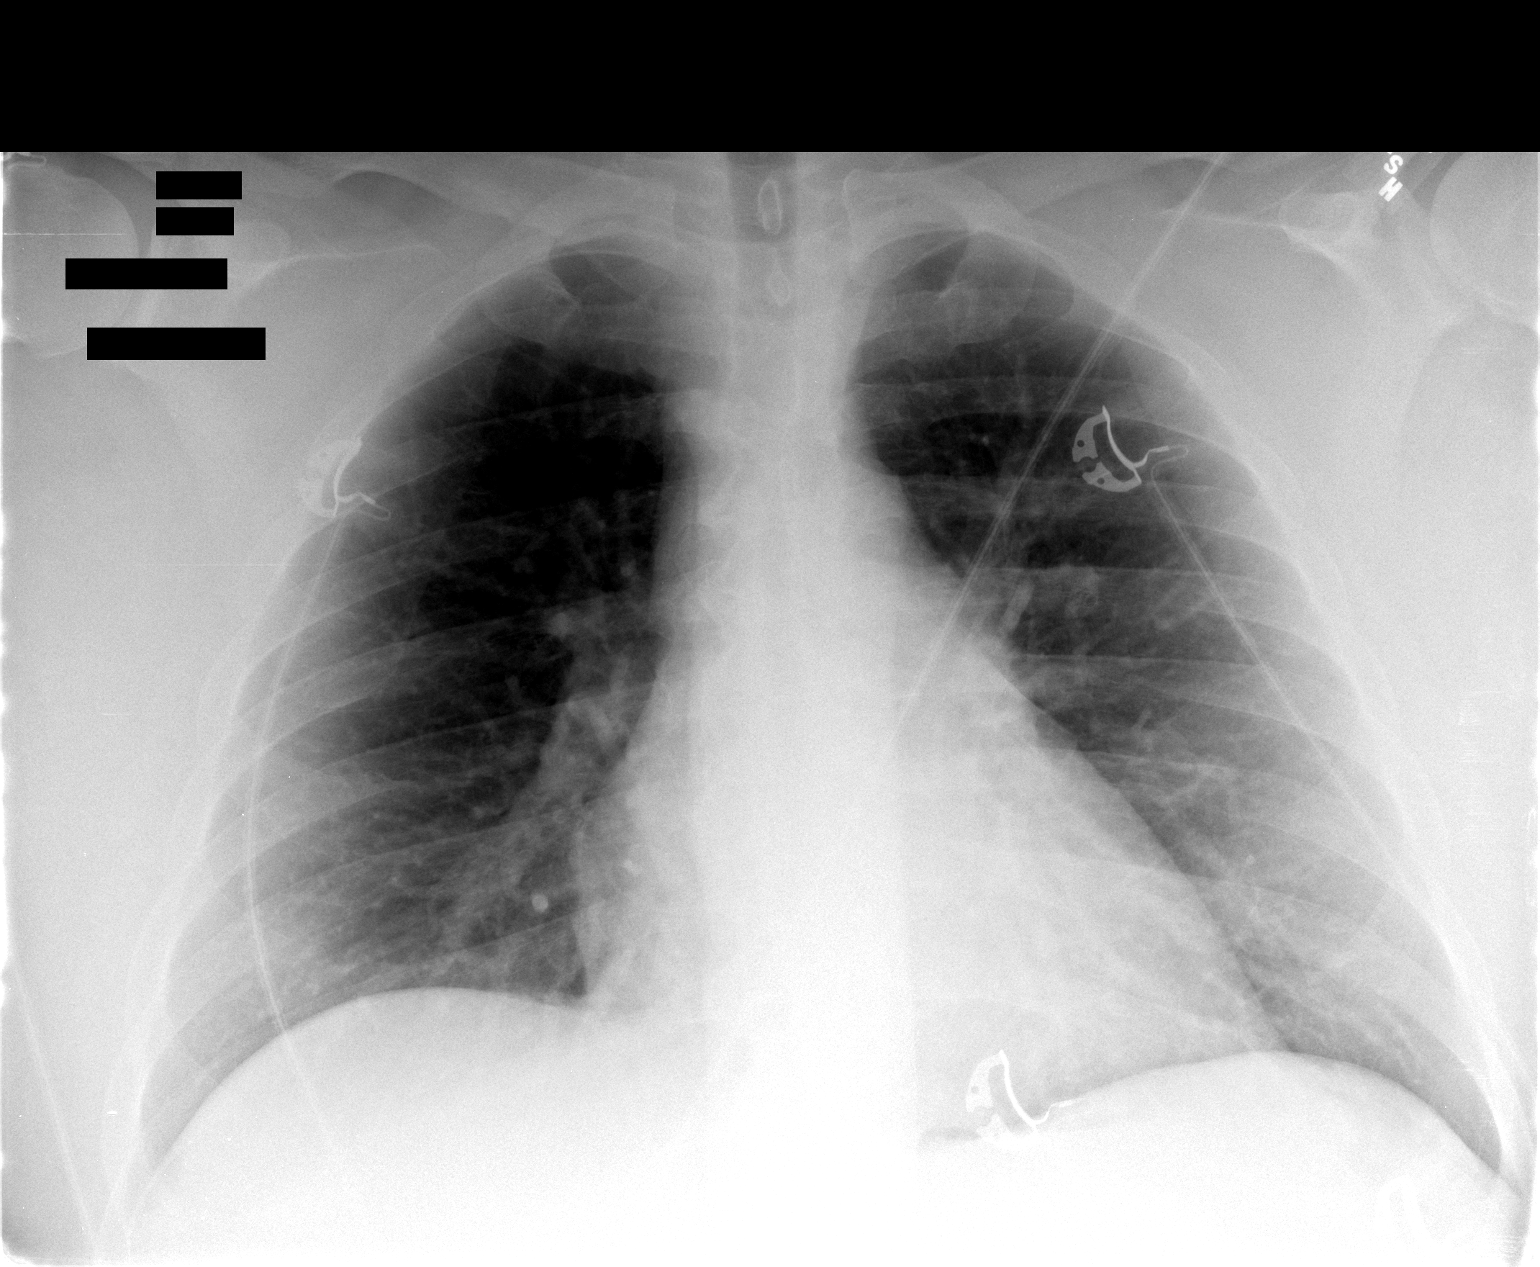

[1 of 1 positions shown; findings below may reference images not displayed]

FINDINGS: Cardiopericardial silhouette and mediastinal contours are
within normal limits.  The aortic arch is indistinct, probably left-
sided.  Monitoring leads are projected over the chest.  No airspace
disease.  No effusion.
IMPRESSION: No active cardiopulmonary disease.

## 2012-04-06 ENCOUNTER — Ambulatory Visit: Payer: 59 | Admitting: Endocrinology

## 2012-08-04 ENCOUNTER — Other Ambulatory Visit: Payer: Self-pay | Admitting: Endocrinology

## 2012-11-30 ENCOUNTER — Telehealth: Payer: Self-pay | Admitting: Endocrinology

## 2012-11-30 NOTE — Telephone Encounter (Signed)
Pt needs follow up w/ Dr. Everardo All. LM for pt to call and schedule appt. Currently awaiting return call from patient / Sherri S.

## 2012-12-16 ENCOUNTER — Telehealth: Payer: Self-pay

## 2012-12-16 ENCOUNTER — Telehealth: Payer: Self-pay | Admitting: *Deleted

## 2012-12-16 NOTE — Telephone Encounter (Signed)
Pt coming in for ov with Dr. Everardo All on 12/28/12.  Would like to get labs before then if possible to discuss at his ov.

## 2012-12-16 NOTE — Telephone Encounter (Signed)
Pt coming in for ov with Dr. Everardo All on 12/28/12. Would like to get labs before then if possible to discuss at his ov. Please advise.

## 2012-12-17 ENCOUNTER — Other Ambulatory Visit: Payer: Self-pay | Admitting: Endocrinology

## 2012-12-17 DIAGNOSIS — Z79899 Other long term (current) drug therapy: Secondary | ICD-10-CM | POA: Insufficient documentation

## 2012-12-17 DIAGNOSIS — E119 Type 2 diabetes mellitus without complications: Secondary | ICD-10-CM

## 2012-12-17 DIAGNOSIS — E785 Hyperlipidemia, unspecified: Secondary | ICD-10-CM

## 2012-12-17 DIAGNOSIS — E871 Hypo-osmolality and hyponatremia: Secondary | ICD-10-CM

## 2012-12-17 DIAGNOSIS — I10 Essential (primary) hypertension: Secondary | ICD-10-CM

## 2012-12-17 NOTE — Telephone Encounter (Signed)
i ordered

## 2012-12-17 NOTE — Telephone Encounter (Signed)
Pt advised.

## 2012-12-22 ENCOUNTER — Other Ambulatory Visit (INDEPENDENT_AMBULATORY_CARE_PROVIDER_SITE_OTHER): Payer: No Typology Code available for payment source

## 2012-12-22 DIAGNOSIS — E785 Hyperlipidemia, unspecified: Secondary | ICD-10-CM

## 2012-12-22 DIAGNOSIS — E1059 Type 1 diabetes mellitus with other circulatory complications: Secondary | ICD-10-CM

## 2012-12-22 DIAGNOSIS — I10 Essential (primary) hypertension: Secondary | ICD-10-CM

## 2012-12-22 DIAGNOSIS — Z79899 Other long term (current) drug therapy: Secondary | ICD-10-CM

## 2012-12-22 LAB — URINALYSIS
Bilirubin Urine: NEGATIVE
Hgb urine dipstick: NEGATIVE
Ketones, ur: NEGATIVE
Leukocytes, UA: NEGATIVE
Nitrite: NEGATIVE
Urobilinogen, UA: 0.2 (ref 0.0–1.0)

## 2012-12-22 LAB — CBC WITH DIFFERENTIAL/PLATELET
Basophils Absolute: 0 10*3/uL (ref 0.0–0.1)
Eosinophils Relative: 2.9 % (ref 0.0–5.0)
HCT: 41.4 % (ref 39.0–52.0)
Lymphs Abs: 3.3 10*3/uL (ref 0.7–4.0)
MCV: 91.1 fl (ref 78.0–100.0)
Monocytes Absolute: 0.8 10*3/uL (ref 0.1–1.0)
Neutrophils Relative %: 53.5 % (ref 43.0–77.0)
Platelets: 192 10*3/uL (ref 150.0–400.0)
RDW: 13.4 % (ref 11.5–14.6)
WBC: 9.6 10*3/uL (ref 4.5–10.5)

## 2012-12-22 LAB — LIPID PANEL
Cholesterol: 246 mg/dL — ABNORMAL HIGH (ref 0–200)
HDL: 28.6 mg/dL — ABNORMAL LOW (ref >39.00)
VLDL: 171 mg/dL — ABNORMAL HIGH (ref 0.0–40.0)

## 2012-12-22 LAB — HEPATIC FUNCTION PANEL
Albumin: 3.9 g/dL (ref 3.5–5.2)
Total Protein: 6.7 g/dL (ref 6.0–8.3)

## 2012-12-22 LAB — BASIC METABOLIC PANEL
BUN: 15 mg/dL (ref 6–23)
Chloride: 95 mEq/L — ABNORMAL LOW (ref 96–112)
Creatinine, Ser: 0.9 mg/dL (ref 0.4–1.5)
GFR: 101.15 mL/min (ref >60.00)
Glucose, Bld: 100 mg/dL — ABNORMAL HIGH (ref 70–99)
Potassium: 3.7 mEq/L (ref 3.5–5.1)

## 2012-12-22 LAB — TSH: TSH: 0.75 u[IU]/mL (ref 0.35–5.50)

## 2012-12-22 LAB — HEMOGLOBIN A1C: Hgb A1c MFr Bld: 7.6 % — ABNORMAL HIGH (ref 4.6–6.5)

## 2012-12-22 LAB — MICROALBUMIN / CREATININE URINE RATIO: Microalb, Ur: 1 mg/dL (ref 0.0–1.9)

## 2012-12-23 ENCOUNTER — Other Ambulatory Visit: Payer: Self-pay

## 2012-12-23 LAB — LDL CHOLESTEROL, DIRECT: Direct LDL: 99.3 mg/dL

## 2012-12-23 MED ORDER — INSULIN LISPRO 100 UNIT/ML ~~LOC~~ SOLN: SUBCUTANEOUS | Status: DC

## 2012-12-24 ENCOUNTER — Telehealth: Payer: Self-pay | Admitting: Endocrinology

## 2012-12-24 NOTE — Telephone Encounter (Signed)
Patient left VM stating pharmacy would not fill Rx for insulin. Patient is out. Please contact.

## 2012-12-24 NOTE — Telephone Encounter (Signed)
Spoke with natalie phatrmacist pt got refill yesterday

## 2012-12-28 ENCOUNTER — Encounter: Payer: Self-pay | Admitting: Endocrinology

## 2012-12-28 ENCOUNTER — Ambulatory Visit (INDEPENDENT_AMBULATORY_CARE_PROVIDER_SITE_OTHER): Payer: No Typology Code available for payment source | Admitting: Endocrinology

## 2012-12-28 VITALS — BP 128/80 | HR 77 | Ht 69.0 in | Wt 273.0 lb

## 2012-12-28 DIAGNOSIS — E119 Type 2 diabetes mellitus without complications: Secondary | ICD-10-CM

## 2012-12-28 MED ORDER — INSULIN LISPRO 100 UNIT/ML (KWIKPEN): 130.0000 [IU] | PEN_INJECTOR | Freq: Three times a day (TID) | SUBCUTANEOUS | Status: DC

## 2012-12-28 NOTE — Patient Instructions (Addendum)
good diet and exercise habits significanly improve the control of your diabetes.  please let me know if you wish to be referred to a dietician.  high blood sugar is very risky to your health.  you should see an eye doctor every year.  You are at higher than average risk for pneumonia and hepatitis-B.  You should be vaccinated against both.   controlling your blood pressure and cholesterol drastically reduces the damage diabetes does to your body.  this also applies to quitting smoking.  please discuss these with your doctor.  you should take an aspirin every day, unless you have been advised by a doctor not to. check your blood sugar twice a day.  vary the time of day when you check, between before the 3 meals, and at bedtime.  also check if you have symptoms of your blood sugar being too high or too low.  please keep a record of the readings and bring it to your next appointment here.  please call us sooner if your blood sugar goes below 70, or if you have a lot of readings over 200. Please come back for a regular physical appointment in 3 months.   Please increase the humalog to 130 units 3 times a day (just before each meal).  However, when you are at work, take just 80 units.

## 2012-12-28 NOTE — Progress Notes (Signed)
Subjective:    Patient ID: Larry Rose, male    DOB: 12-25-1965, 47 y.o.   MRN: [id number]  HPI The state of at least three ongoing medical problems is addressed today, with interval history of each noted here: Pt returns for f/u of insulin-requiring DM (dx'ed 1993--no known complications; he has never had severe hypoglycemia or DKA).  no cbg record, but states cbg's vary from 150-260.  It is in general higher as the day goes on.  he has increased humalog to 120 units 3 times a day (just before each meal).  He had an episode of mild hypoglycemia just once since last ov, when he was at work.   Dyslipidemia: he denies chest pain  HTN: he denies sob Past Medical History  Diagnosis Date  . TINEA CRURIS 11/07/2008  . DIABETES MELLITUS, TYPE II 04/01/2007  . HYPERLIPIDEMIA, ATHEROGENIC 09/29/2007  . ALCOHOLISM 11/30/2009  . CARPAL TUNNEL SYNDROME, RIGHT 10/06/2008  . HYPERTENSION 04/01/2007  . ALLERGIC RHINITIS 04/01/2007  . Rosacea 05/14/2010  . Hepatic steatosis     No past surgical history on file.  History   Social History  . Marital Status: Married    Spouse Name: N/A    Number of Children: N/A  . Years of Education: N/A   Occupational History  . Works Quarry manager    Social History Main Topics  . Smoking status: Never Smoker   . Smokeless tobacco: Not on file  . Alcohol Use: Not on file  . Drug Use: Not on file  . Sexually Active: Not on file   Other Topics Concern  . Not on file   Social History Narrative  . No narrative on file    Current Outpatient Prescriptions on File Prior to Visit  Medication Sig Dispense Refill  . B-D ULTRAFINE III SHORT PEN 31G X 8 MM MISC USE AS DIRECTED THREE TIMES DAILY  100 each  5  . clotrimazole-betamethasone (LOTRISONE) cream Apply topically 2 (two) times daily as needed. For rash  30 g  1  . glucose blood (ONE TOUCH ULTRA TEST) test strip 1 each by Other route 2 (two) times daily. And lancets 2/day 250.01  100 each  12   . lisinopril-hydrochlorothiazide (PRINZIDE,ZESTORETIC) 20-12.5 MG per tablet TAKE 1 TABLET BY MOUTH EVERY DAY  30 tablet  11  . Multiple Vitamin (MULTIVITAMIN) tablet Take 1 tablet by mouth daily.        Letta Pate DELICA LANCETS MISC Use as directed       No current facility-administered medications on file prior to visit.   Allergies  Allergen Reactions  . Enalapril Maleate   . Lovastatin    Family History  Problem Relation Age of Onset  . Cancer Neg Hx    BP 128/80  Pulse 77  Ht 5\' 9"  (1.753 m)  Wt 273 lb (123.832 kg)  BMI 40.3 kg/m2  SpO2 97%  Review of Systems Denies LOC and weight change    Objective:   Physical Exam VITAL SIGNS:  See vs page GENERAL: no distress   Lab Results  Component Value Date   WBC 9.6 12/22/2012   HGB 14.7 12/22/2012   HCT 41.4 12/22/2012   PLT 192.0 12/22/2012   GLUCOSE 100* 12/22/2012   CHOL 246* 12/22/2012   TRIG 855.0 Triglyceride is over 400; calculations on Lipids are invalid.* 12/22/2012   HDL 28.60* 12/22/2012   LDLDIRECT 99.3 12/22/2012   ALT 43 12/22/2012   AST 28 12/22/2012   NA 132*  12/22/2012   K 3.7 12/22/2012   CL 95* 12/22/2012   CREATININE 0.9 12/22/2012   BUN 15 12/22/2012   CO2 29 12/22/2012   TSH 0.75 12/22/2012   PSA 0.33 05/17/2011   HGBA1C 7.6* 12/22/2012   MICROALBUR 1.0 12/22/2012      Assessment & Plan:  DM: needs increased rx HTN: well-controlled Dyslipidemia, needs increased rx.  Increasing insulin will help.

## 2013-01-15 DIAGNOSIS — Z0279 Encounter for issue of other medical certificate: Secondary | ICD-10-CM

## 2013-01-18 ENCOUNTER — Other Ambulatory Visit: Payer: Self-pay | Admitting: *Deleted

## 2013-01-18 MED ORDER — INSULIN PEN NEEDLE 32G X 4 MM MISC: Status: DC

## 2013-03-17 ENCOUNTER — Other Ambulatory Visit: Payer: Self-pay | Admitting: Endocrinology

## 2013-03-18 ENCOUNTER — Other Ambulatory Visit: Payer: Self-pay | Admitting: *Deleted

## 2013-03-18 MED ORDER — NEOMYCIN-POLYMYXIN-DEXAMETH 0.1 % OP SUSP: OPHTHALMIC | Status: DC

## 2013-06-01 ENCOUNTER — Telehealth: Payer: Self-pay | Admitting: Endocrinology

## 2013-06-01 NOTE — Telephone Encounter (Signed)
Pt advised we do have 2 samples

## 2013-07-30 ENCOUNTER — Other Ambulatory Visit: Payer: Self-pay

## 2013-07-30 MED ORDER — LISINOPRIL-HYDROCHLOROTHIAZIDE 20-12.5 MG PO TABS: ORAL_TABLET | ORAL | Status: DC

## 2013-07-30 NOTE — Telephone Encounter (Signed)
Refill for Lisinopril.

## 2013-08-23 ENCOUNTER — Encounter: Payer: Self-pay | Admitting: Endocrinology

## 2013-08-23 ENCOUNTER — Ambulatory Visit (INDEPENDENT_AMBULATORY_CARE_PROVIDER_SITE_OTHER): Payer: BC Managed Care – PPO | Admitting: Endocrinology

## 2013-08-23 VITALS — BP 136/78 | HR 88 | Temp 98.6°F | Ht 69.0 in | Wt 286.0 lb

## 2013-08-23 DIAGNOSIS — E871 Hypo-osmolality and hyponatremia: Secondary | ICD-10-CM

## 2013-08-23 DIAGNOSIS — Z79899 Other long term (current) drug therapy: Secondary | ICD-10-CM

## 2013-08-23 DIAGNOSIS — E119 Type 2 diabetes mellitus without complications: Secondary | ICD-10-CM

## 2013-08-23 DIAGNOSIS — I10 Essential (primary) hypertension: Secondary | ICD-10-CM

## 2013-08-23 DIAGNOSIS — E785 Hyperlipidemia, unspecified: Secondary | ICD-10-CM

## 2013-08-23 LAB — CBC WITH DIFFERENTIAL/PLATELET
Basophils Absolute: 0 10*3/uL (ref 0.0–0.1)
Basophils Relative: 0.3 % (ref 0.0–3.0)
EOS ABS: 0.3 10*3/uL (ref 0.0–0.7)
Eosinophils Relative: 3.4 % (ref 0.0–5.0)
HCT: 41.7 % (ref 39.0–52.0)
Hemoglobin: 14.5 g/dL (ref 13.0–17.0)
LYMPHS ABS: 2.3 10*3/uL (ref 0.7–4.0)
Lymphocytes Relative: 25.1 % (ref 12.0–46.0)
MCHC: 34.7 g/dL (ref 30.0–36.0)
MCV: 91.9 fl (ref 78.0–100.0)
Monocytes Absolute: 0.7 10*3/uL (ref 0.1–1.0)
Monocytes Relative: 7.8 % (ref 3.0–12.0)
NEUTROS PCT: 63.4 % (ref 43.0–77.0)
Neutro Abs: 5.7 10*3/uL (ref 1.4–7.7)
Platelets: 199 10*3/uL (ref 150.0–400.0)
RBC: 4.54 Mil/uL (ref 4.22–5.81)
RDW: 13.4 % (ref 11.5–14.6)
WBC: 9 10*3/uL (ref 4.5–10.5)

## 2013-08-23 LAB — HEPATIC FUNCTION PANEL
ALK PHOS: 56 U/L (ref 39–117)
ALT: 51 U/L (ref 0–53)
AST: 29 U/L (ref 0–37)
Albumin: 4.2 g/dL (ref 3.5–5.2)
Bilirubin, Direct: 0 mg/dL (ref 0.0–0.3)
Total Bilirubin: 0.5 mg/dL (ref 0.3–1.2)
Total Protein: 7 g/dL (ref 6.0–8.3)

## 2013-08-23 LAB — LIPID PANEL
CHOLESTEROL: 281 mg/dL — AB (ref 0–200)
HDL: 36.1 mg/dL — AB (ref >39.00)
Total CHOL/HDL Ratio: 8
Triglycerides: 822 mg/dL — ABNORMAL HIGH (ref 0.0–149.0)
VLDL: 164.4 mg/dL — ABNORMAL HIGH (ref 0.0–40.0)

## 2013-08-23 LAB — BASIC METABOLIC PANEL
BUN: 16 mg/dL (ref 6–23)
CO2: 28 mEq/L (ref 19–32)
CREATININE: 1 mg/dL (ref 0.4–1.5)
Calcium: 9.6 mg/dL (ref 8.4–10.5)
Chloride: 98 mEq/L (ref 96–112)
GFR: 87.89 mL/min (ref >60.00)
GLUCOSE: 245 mg/dL — AB (ref 70–99)
Potassium: 4.3 mEq/L (ref 3.5–5.1)
Sodium: 135 mEq/L (ref 135–145)

## 2013-08-23 LAB — HEMOGLOBIN A1C: Hgb A1c MFr Bld: 8.4 % — ABNORMAL HIGH (ref 4.6–6.5)

## 2013-08-23 LAB — MICROALBUMIN / CREATININE URINE RATIO
Creatinine,U: 105 mg/dL
Microalb Creat Ratio: 0.8 mg/g (ref 0.0–30.0)
Microalb, Ur: 0.8 mg/dL (ref 0.0–1.9)

## 2013-08-23 LAB — TSH: TSH: 0.85 u[IU]/mL (ref 0.35–5.50)

## 2013-08-23 MED ORDER — INSULIN LISPRO 100 UNIT/ML (KWIKPEN): 150.0000 [IU] | PEN_INJECTOR | Freq: Three times a day (TID) | SUBCUTANEOUS | Status: DC

## 2013-08-23 NOTE — Progress Notes (Signed)
Subjective:    Patient ID: Larry Rose, male    DOB: 27-Sep-1965, 48 y.o.   MRN: [id number]  HPI Pt returns for f/u of insulin-requiring DM (dx'ed 1993, on a routine blood test; he has mild if any neuropathy of the lower extremities; no known associated complications; he has been on insulin since 2013; he has never had severe hypoglycemia or DKA; he has declined weight-loss surgery).  no cbg record, but states cbg's vary from 90-300.  It is in general higher as the day goes on.     Past Medical History  Diagnosis Date  . TINEA CRURIS 11/07/2008  . DIABETES MELLITUS, TYPE II 04/01/2007  . HYPERLIPIDEMIA, ATHEROGENIC 09/29/2007  . ALCOHOLISM 11/30/2009  . CARPAL TUNNEL SYNDROME, RIGHT 10/06/2008  . HYPERTENSION 04/01/2007  . ALLERGIC RHINITIS 04/01/2007  . Rosacea 05/14/2010  . Hepatic steatosis     No past surgical history on file.  History   Social History  . Marital Status: Married    Spouse Name: N/A    Number of Children: N/A  . Years of Education: N/A   Occupational History  . Works [employer]arry [employer]    Social History Main Topics  . Smoking status: Never Smoker   . Smokeless tobacco: Not on file  . Alcohol Use: Not on file  . Drug Use: Not on file  . Sexual Activity: Not on file   Other Topics Concern  . Not on file   Social History Narrative  . No narrative on file    Current Outpatient Prescriptions on File Prior to Visit  Medication Sig Dispense Refill  . B-D ULTRAFINE III SHORT PEN 31G X 8 MM MISC USE AS DIRECTED THREE TIMES DAILY  100 each  5  . clotrimazole-betamethasone (LOTRISONE) cream Apply topically 2 (two) times daily as needed. For rash  30 g  1  . glucose blood (ONE TOUCH ULTRA TEST) test strip 1 each by Other route 2 (two) times daily. And lancets 2/day 250.01  100 each  12  . Insulin Pen Needle 32G X 4 MM MISC USE AS DIRECTED 3 TIMES DAILY  100 each  11  . lisinopril-hydrochlorothiazide (PRINZIDE,ZESTORETIC) 20-12.5 MG per tablet TAKE 1  TABLET BY MOUTH EVERY DAY  30 tablet  11  . Multiple Vitamin (MULTIVITAMIN) tablet Take 1 tablet by mouth daily.        Marland Kitchen. neomycin-polymyxin b-dexamethasone (MAXITROL) 3.5-10000-0.1 SUSP PUT 1 DROP INTO LEFT EYE EVERY 6 HOURS FOR 7 DAYS  5 mL  0  . neomycin-polymyxin-dexamethasone (MAXITROL) 0.1 % ophthalmic suspension Apply 1 drop into eye every 6 hrs for 7 days.  5 mL  0  . ONETOUCH DELICA LANCETS MISC Use as directed       No current facility-administered medications on file prior to visit.    Allergies  Allergen Reactions  . Enalapril Maleate   . Lovastatin    Family History  Problem Relation Age of Onset  . Cancer Neg Hx    BP 136/78  Pulse 88  Temp(Src) 98.6 F (37 C) (Oral)  Ht 5\' 9"  (1.753 m)  Wt 286 lb (129.729 kg)  BMI 42.22 kg/m2  SpO2 96%  Review of Systems denies hypoglycemia and weight change.      Objective:   Physical Exam VITAL SIGNS:  See vs page GENERAL: no distress.  Morbid obesity.    Lab Results  Component Value Date   HGBA1C 8.4* 08/23/2013   Lab Results  Component Value Date   CHOL  281* 08/23/2013   HDL 36.10* 08/23/2013   LDLDIRECT 110.5 08/23/2013   TRIG 822.0* 08/23/2013   CHOLHDL 8 08/23/2013      Assessment & Plan:  DM: he needs increased rx.  This insulin regimen was chosen from multiple options, as it best matches his insulin to his changing requirements throughout the day.  The benefits of glycemic control must be weighed against the risks of hypoglycemia.   Morbid obesity: this complicates the rx of DM. Dyslipidemia: he needs increased rx

## 2013-08-23 NOTE — Patient Instructions (Addendum)
check your blood sugar twice a day.  vary the time of day when you check, between before the 3 meals, and at bedtime.  also check if you have symptoms of your blood sugar being too high or too low.  please keep a record of the readings and bring it to your next appointment here.  please call us sooner if your blood sugar goes below 70, or if you have a lot of readings over 200.  Please come back for a regular physical appointment in 3 months.   Please increase the humalog to 150 units 3 times a day (just before each meal).  However, when you are at work, take just 80 units.

## 2013-08-24 LAB — URINALYSIS, ROUTINE W REFLEX MICROSCOPIC
BILIRUBIN URINE: NEGATIVE
Hgb urine dipstick: NEGATIVE
KETONES UR: NEGATIVE
LEUKOCYTES UA: NEGATIVE
Nitrite: NEGATIVE
PH: 5.5 (ref 5.0–8.0)
RBC / HPF: NONE SEEN (ref 0–?)
Specific Gravity, Urine: 1.025 (ref 1.000–1.030)
TOTAL PROTEIN, URINE-UPE24: NEGATIVE
URINE GLUCOSE: 100 — AB
Urobilinogen, UA: 0.2 (ref 0.0–1.0)
WBC, UA: NONE SEEN (ref 0–?)

## 2013-08-24 LAB — LDL CHOLESTEROL, DIRECT: LDL DIRECT: 110.5 mg/dL

## 2013-08-27 ENCOUNTER — Telehealth: Payer: Self-pay | Admitting: Endocrinology

## 2013-08-30 ENCOUNTER — Telehealth: Payer: Self-pay

## 2013-08-30 MED ORDER — INSULIN ASPART 100 UNIT/ML ~~LOC~~ SOLN: SUBCUTANEOUS | Status: DC

## 2013-08-30 NOTE — Telephone Encounter (Signed)
ok 

## 2013-08-30 NOTE — Telephone Encounter (Signed)
Pt called [facility] over the weekend. Pt states that Humalog is not requiring a PA. The preferred is Novalog. Ok to change? Thanks!

## 2013-09-27 ENCOUNTER — Other Ambulatory Visit: Payer: Self-pay | Admitting: Endocrinology

## 2013-09-27 ENCOUNTER — Other Ambulatory Visit: Payer: Self-pay

## 2013-09-27 MED ORDER — INSULIN ASPART 100 UNIT/ML FLEXPEN: PEN_INJECTOR | SUBCUTANEOUS | Status: DC

## 2013-11-17 ENCOUNTER — Ambulatory Visit (INDEPENDENT_AMBULATORY_CARE_PROVIDER_SITE_OTHER): Payer: BC Managed Care – PPO | Admitting: Endocrinology

## 2013-11-17 ENCOUNTER — Encounter: Payer: Self-pay | Admitting: Endocrinology

## 2013-11-17 VITALS — BP 130/92 | HR 77 | Temp 98.0°F | Ht 69.0 in | Wt 283.0 lb

## 2013-11-17 DIAGNOSIS — Z0000 Encounter for general adult medical examination without abnormal findings: Secondary | ICD-10-CM | POA: Insufficient documentation

## 2013-11-17 DIAGNOSIS — E119 Type 2 diabetes mellitus without complications: Secondary | ICD-10-CM

## 2013-11-17 LAB — HEMOGLOBIN A1C: Hgb A1c MFr Bld: 7.4 % — ABNORMAL HIGH (ref 4.6–6.5)

## 2013-11-17 MED ORDER — INSULIN ASPART 100 UNIT/ML FLEXPEN: 175.0000 [IU] | PEN_INJECTOR | Freq: Three times a day (TID) | SUBCUTANEOUS | Status: DC

## 2013-11-17 NOTE — Patient Instructions (Addendum)
check your blood sugar twice a day.  vary the time of day when you check, between before the 3 meals, and at bedtime.  also check if you have symptoms of your blood sugar being too high or too low.  please keep a record of the readings and bring it to your next appointment here.  please call us sooner if your blood sugar goes below 70, or if you have a lot of readings over 200.  Please come back for a follow-up appointment in 3 months.    Please increase the humalog to 175 units 3 times a day (just before each meal). However, when you are at work, take just 80 units.   please consider these measures for your health:  minimize alcohol.  do not use tobacco products.  keep firearms safely stored.  always use seat belts.  have working smoke alarms in your home.  see an eye doctor and dentist regularly.  never drive under the influence of alcohol or drugs (including prescription drugs).  those with fair skin should take precautions against the sun.

## 2013-11-17 NOTE — Progress Notes (Signed)
Subjective:    Patient ID: Larry Rose, male    DOB: 31-May-1966, 48 y.o.   MRN: [id number]  HPI Pt is here for regular wellness examination, and is feeling pretty well in general, and says chronic med probs are stable, except as noted below.  He declines pneumovax.   Past Medical History  Diagnosis Date  . TINEA CRURIS 11/07/2008  . DIABETES MELLITUS, TYPE II 04/01/2007  . HYPERLIPIDEMIA, ATHEROGENIC 09/29/2007  . ALCOHOLISM 11/30/2009  . CARPAL TUNNEL SYNDROME, RIGHT 10/06/2008  . HYPERTENSION 04/01/2007  . ALLERGIC RHINITIS 04/01/2007  . Rosacea 05/14/2010  . Hepatic steatosis     No past surgical history on file.  History   Social History  . Marital Status: Married    Spouse Name: N/A    Number of Children: N/A  . Years of Education: N/A   Occupational History  . Works [employer]    Social History Main Topics  . Smoking status: Never Smoker   . Smokeless tobacco: Not on file  . Alcohol Use: Not on file  . Drug Use: Not on file  . Sexual Activity: Not on file   Other Topics Concern  . Not on file   Social History Narrative  . No narrative on file    Current Outpatient Prescriptions on File Prior to Visit  Medication Sig Dispense Refill  . clotrimazole-betamethasone (LOTRISONE) cream Apply topically 2 (two) times daily as needed. For rash  30 g  1  . glucose blood (ONE TOUCH ULTRA TEST) test strip 1 each by Other route 2 (two) times daily. And lancets 2/day 250.01  100 each  12  . lisinopril-hydrochlorothiazide (PRINZIDE,ZESTORETIC) 20-12.5 MG per tablet TAKE 1 TABLET BY MOUTH EVERY DAY  30 tablet  11  . Multiple Vitamin (MULTIVITAMIN) tablet Take 1 tablet by mouth daily.         No current facility-administered medications on file prior to visit.    Allergies  Allergen Reactions  . Enalapril Maleate   . Lovastatin     Family History  Problem Relation Age of Onset  . Cancer Neg Hx     BP 130/92  Pulse 77  Temp(Src) 98 F (36.7 C)  (Oral)  Ht 5\' 9"  (1.753 m)  Wt 283 lb (128.368 kg)  BMI 41.77 kg/m2  SpO2 97%    Review of Systems  Constitutional: Negative for fever.  HENT: Negative for hearing loss.   Eyes: Negative for visual disturbance.  Respiratory: Negative for shortness of breath.   Cardiovascular: Negative for chest pain.  Gastrointestinal: Negative for anal bleeding.  Endocrine: Negative for cold intolerance.  Genitourinary: Negative for hematuria.  Musculoskeletal: Negative for back pain.  Skin: Negative for rash.  Allergic/Immunologic: Positive for environmental allergies.  Neurological: Negative for numbness.  Hematological: Does not bruise/bleed easily.  Psychiatric/Behavioral: Negative for dysphoric mood.       Objective:   Physical Exam VS: see vs page GEN: no distress.  Morbid obesity HEAD: head: no deformity eyes: no periorbital swelling, no proptosis external nose and ears are normal mouth: no lesion seen NECK: supple, thyroid is not enlarged CHEST WALL: no deformity LUNGS: clear to auscultation BREASTS:  No gynecomastia CV: reg rate and rhythm, no murmur ABD: abdomen is soft, nontender.  no hepatosplenomegaly.  not distended.  no hernia MUSCULOSKELETAL: muscle bulk and strength are grossly normal.  no obvious joint swelling.  gait is normal and steady PULSES:  no carotid bruit NEURO:  cn 2-12 grossly intact.  readily moves all 4's.   SKIN:  Normal texture and temperature.  No rash or suspicious lesion is visible.   NODES:  None palpable at the neck PSYCH: alert, well-oriented.  Does not appear anxious nor depressed.      Assessment & Plan:  Wellness visit today, with problems stable, except as noted.      SEPARATE EVALUATION FOLLOWS--EACH PROBLEM HERE IS NEW, NOT RESPONDING TO TREATMENT, OR POSES SIGNIFICANT RISK TO THE PATIENT'S HEALTH: HISTORY OF THE PRESENT ILLNESS: Pt returns for f/u of insulin-requiring DM (dx'ed 1993, on a routine blood test; he has mild if any  neuropathy of the lower extremities; no known associated complications; he has been on insulin since 2013; he takes multiple daily injections, and works rotating shifts; he has never had severe hypoglycemia or DKA; he has declined weight-loss surgery; he also declines U-500).  no cbg record, but states cbg's vary from 140-210.  It is in general higher as the day goes on.   PAST MEDICAL HISTORY reviewed and up to date today. REVIEW OF SYSTEMS:  He denies hypoglycemia.  He has gained a few lbs.   PHYSICAL EXAMINATION: VITAL SIGNS:  See vs page. GENERAL: no distress. LAB/XRAY RESULTS: Lab Results  Component Value Date   HGBA1C 7.4* 11/17/2013  IMPRESSION: DM: he needs increased rx Weight gain/morbid obesity: these complicate the rx of DM, but he declines surgery for this.   PLAN: See instruction page

## 2014-02-16 ENCOUNTER — Other Ambulatory Visit: Payer: Self-pay

## 2014-02-16 ENCOUNTER — Other Ambulatory Visit: Payer: Self-pay | Admitting: Endocrinology

## 2014-02-16 MED ORDER — INSULIN PEN NEEDLE 32G X 4 MM MISC: Status: DC

## 2014-02-22 ENCOUNTER — Ambulatory Visit: Payer: BC Managed Care – PPO | Admitting: Endocrinology

## 2014-02-23 ENCOUNTER — Encounter: Payer: Self-pay | Admitting: Endocrinology

## 2014-02-23 ENCOUNTER — Ambulatory Visit (INDEPENDENT_AMBULATORY_CARE_PROVIDER_SITE_OTHER): Payer: BC Managed Care – PPO | Admitting: Endocrinology

## 2014-02-23 VITALS — BP 116/62 | HR 68 | Temp 98.4°F | Ht 69.0 in | Wt 283.0 lb

## 2014-02-23 DIAGNOSIS — E119 Type 2 diabetes mellitus without complications: Secondary | ICD-10-CM

## 2014-02-23 LAB — HEMOGLOBIN A1C: Hgb A1c MFr Bld: 6.5 % (ref 4.6–6.5)

## 2014-02-23 NOTE — Progress Notes (Signed)
Subjective:    Patient ID: Larry Rose, male    DOB: 1966-07-07, 48 y.o.   MRN: [id number]  HPI Pt returns for f/u of insulin-requiring DM (dx'ed 1993, on a routine blood test; he has mild if any neuropathy of the lower extremities; no known associated complications; he has been on insulin since 2013; he takes multiple daily injections, and works rotating shifts; he has never had pancreatitis, severe hypoglycemia, or DKA; he has declined weight-loss surgery; he also declines U-500).  e now works 3rd shift, and will be on this for at least 2 more months.  no cbg record, but states cbg's vary from 80-mid-100's.  He seldom misses the insulin.   Past Medical History  Diagnosis Date  . TINEA CRURIS 11/07/2008  . DIABETES MELLITUS, TYPE II 04/01/2007  . HYPERLIPIDEMIA, ATHEROGENIC 09/29/2007  . ALCOHOLISM 11/30/2009  . CARPAL TUNNEL SYNDROME, RIGHT 10/06/2008  . HYPERTENSION 04/01/2007  . ALLERGIC RHINITIS 04/01/2007  . Rosacea 05/14/2010  . Hepatic steatosis     No past surgical history on file.  History   Social History  . Marital Status: Married    Spouse Name: N/A    Number of Children: N/A  . Years of Education: N/A   Occupational History  . Works [employer]    Social History Main Topics  . Smoking status: Never Smoker   . Smokeless tobacco: Not on file  . Alcohol Use: Not on file  . Drug Use: Not on file  . Sexual Activity: Not on file   Other Topics Concern  . Not on file   Social History Narrative  . No narrative on file    Current Outpatient Prescriptions on File Prior to Visit  Medication Sig Dispense Refill  . clotrimazole-betamethasone (LOTRISONE) cream Apply topically 2 (two) times daily as needed. For rash  30 g  1  . glucose blood (ONE TOUCH ULTRA TEST) test strip 1 each by Other route 2 (two) times daily. And lancets 2/day 250.01  100 each  12  . insulin aspart (NOVOLOG FLEXPEN) 100 UNIT/ML FlexPen Inject 175 Units into the skin 3 (three)  times daily with meals. And pen needles 5/day  180 mL  11  . Insulin Pen Needle (BD PEN NEEDLE NANO U/F) 32G X 4 MM MISC Use 3 times per day.  100 each  1  . lisinopril-hydrochlorothiazide (PRINZIDE,ZESTORETIC) 20-12.5 MG per tablet TAKE 1 TABLET BY MOUTH EVERY DAY  30 tablet  11  . Multiple Vitamin (MULTIVITAMIN) tablet Take 1 tablet by mouth daily.        Marland Kitchen. NOVOLOG FLEXPEN 100 UNIT/ML FlexPen INJECT 150 UNITS INTO SKIN THREE TIMES A DAY WITH MEALS (9 BOXES)  135 pen  1   No current facility-administered medications on file prior to visit.    Allergies  Allergen Reactions  . Enalapril Maleate   . Lovastatin     Family History  Problem Relation Age of Onset  . Cancer Neg Hx     BP 116/62  Pulse 68  Temp(Src) 98.4 F (36.9 C) (Oral)  Ht 5\' 9"  (1.753 m)  Wt 283 lb (128.368 kg)  BMI 41.77 kg/m2  SpO2 98%  Review of Systems He denies hypoglycemia and weight change.      Objective:   Physical Exam Pulses: dorsalis pedis intact bilat.   Feet: no deformity. normal color and temp.  no edema Skin:  no ulcer on the feet.   Neuro: sensation is intact to touch on the feet  Lab Results  Component Value Date   HGBA1C 6.5 02/23/2014      Assessment & Plan:  DM: well-controlled Occupational status (shift work).  In this setting, multiple daily injections are best, as the pattern of cbg's says he does not need basal rx.    Patient is advised the following: Patient Instructions  check your blood sugar twice a day.  vary the time of day when you check, between before the 3 meals, and at bedtime.  also check if you have symptoms of your blood sugar being too high or too low.  please keep a record of the readings and bring it to your next appointment here.  please call us sooner if your blood sugar goes below 70, or if you have a lot of readings over 200.  Please come back for a follow-up appointment in 3 months.    blood tests are being requested for you today.  We'll contact you with  results. Please continue the humalog, 175 units 3 times a day (just before each meal). However, when you are at work, take just 80 units.

## 2014-02-23 NOTE — Patient Instructions (Addendum)
check your blood sugar twice a day.  vary the time of day when you check, between before the 3 meals, and at bedtime.  also check if you have symptoms of your blood sugar being too high or too low.  please keep a record of the readings and bring it to your next appointment here.  please call us sooner if your blood sugar goes below 70, or if you have a lot of readings over 200.  Please come back for a follow-up appointment in 3 months.    blood tests are being requested for you today.  We'll contact you with results. Please continue the humalog, 175 units 3 times a day (just before each meal). However, when you are at work, take just 80 units.

## 2014-03-23 ENCOUNTER — Telehealth: Payer: Self-pay

## 2014-03-23 NOTE — Telephone Encounter (Signed)
Diabetic Bundle. Pt coming for OV with Dr. Everardo AllEllison for 05/23/2014.

## 2014-05-04 ENCOUNTER — Other Ambulatory Visit: Payer: Self-pay | Admitting: Endocrinology

## 2014-05-23 ENCOUNTER — Ambulatory Visit (INDEPENDENT_AMBULATORY_CARE_PROVIDER_SITE_OTHER): Payer: BC Managed Care – PPO | Admitting: Endocrinology

## 2014-05-23 ENCOUNTER — Encounter: Payer: Self-pay | Admitting: Endocrinology

## 2014-05-23 DIAGNOSIS — L299 Pruritus, unspecified: Secondary | ICD-10-CM

## 2014-05-23 DIAGNOSIS — E119 Type 2 diabetes mellitus without complications: Secondary | ICD-10-CM

## 2014-05-23 LAB — LIPID PANEL
CHOL/HDL RATIO: 10
CHOLESTEROL: 275 mg/dL — AB (ref 0–200)
HDL: 28.1 mg/dL — ABNORMAL LOW (ref >39.00)
NONHDL: 246.9
Triglycerides: 981 mg/dL — ABNORMAL HIGH (ref 0.0–149.0)
VLDL: 196.2 mg/dL — ABNORMAL HIGH (ref 0.0–40.0)

## 2014-05-23 LAB — LDL CHOLESTEROL, DIRECT: Direct LDL: 92.8 mg/dL

## 2014-05-23 LAB — HEMOGLOBIN A1C: Hgb A1c MFr Bld: 6.8 % — ABNORMAL HIGH (ref 4.6–6.5)

## 2014-05-23 MED ORDER — TRIAMCINOLONE ACETONIDE 0.1 % EX CREA: 1.0000 "application " | TOPICAL_CREAM | Freq: Three times a day (TID) | CUTANEOUS | Status: DC

## 2014-05-23 MED ORDER — FENOFIBRATE MICRONIZED 200 MG PO CAPS: 200.0000 mg | ORAL_CAPSULE | Freq: Every day | ORAL | Status: DC

## 2014-05-23 NOTE — Progress Notes (Signed)
Subjective:    Patient ID: Larry Rose, male    DOB: 07/29/1966, 48 y.o.   MRN: [id number]  HPI Pt returns for f/u of diabetes mellitus: DM type: Insulin-requiring type 2 Dx'ed: 1993 Complications: none Therapy: insulin since DKA: never Severe hypoglycemia: never Pancreatitis: never Other: he takes multiple daily injections, and works rotating shifts; he has declined weight-loss surgery; he also declines U-500  Interval history:  he just went back to first shift, no cbg record, but states cbg's vary from 80-mid-100's.  He seldom misses the insulin.  He has moderate itching on the back, and assoc "moles." Past Medical History  Diagnosis Date  . TINEA CRURIS 11/07/2008  . DIABETES MELLITUS, TYPE II 04/01/2007  . HYPERLIPIDEMIA, ATHEROGENIC 09/29/2007  . ALCOHOLISM 11/30/2009  . CARPAL TUNNEL SYNDROME, RIGHT 10/06/2008  . HYPERTENSION 04/01/2007  . ALLERGIC RHINITIS 04/01/2007  . Rosacea 05/14/2010  . Hepatic steatosis     No past surgical history on file.  History   Social History  . Marital Status: Married    Spouse Name: N/A    Number of Children: N/A  . Years of Education: N/A   Occupational History  . Works [employer]    Social History Main Topics  . Smoking status: Never Smoker   . Smokeless tobacco: Not on file  . Alcohol Use: Not on file  . Drug Use: Not on file  . Sexual Activity: Not on file   Other Topics Concern  . Not on file   Social History Narrative  . No narrative on file    Current Outpatient Prescriptions on File Prior to Visit  Medication Sig Dispense Refill  . BD PEN NEEDLE NANO U/F 32G X 4 MM MISC USE 3 TIMES PER DAY.  300 each  1  . clotrimazole-betamethasone (LOTRISONE) cream Apply topically 2 (two) times daily as needed. For rash  30 g  1  . glucose blood (ONE TOUCH ULTRA TEST) test strip 1 each by Other route 2 (two) times daily. And lancets 2/day 250.01  100 each  12  . insulin aspart (NOVOLOG FLEXPEN) 100 UNIT/ML  FlexPen Inject 175 Units into the skin 3 (three) times daily with meals. And pen needles 5/day  180 mL  11  . lisinopril-hydrochlorothiazide (PRINZIDE,ZESTORETIC) 20-12.5 MG per tablet TAKE 1 TABLET BY MOUTH EVERY DAY  30 tablet  11  . Multiple Vitamin (MULTIVITAMIN) tablet Take 1 tablet by mouth daily.        Marland Kitchen. NOVOLOG FLEXPEN 100 UNIT/ML FlexPen INJECT 150 UNITS INTO SKIN THREE TIMES A DAY WITH MEALS (9 BOXES)  135 pen  1   No current facility-administered medications on file prior to visit.    Allergies  Allergen Reactions  . Enalapril Maleate   . Lovastatin     Family History  Problem Relation Age of Onset  . Cancer Neg Hx     BP 126/68  Pulse 84  Temp(Src) 98.1 F (36.7 C) (Oral)  Ht 5\' 9"  (1.753 m)  Wt 289 lb (131.09 kg)  BMI 42.66 kg/m2  SpO2 94%   Review of Systems He denies hypoglycemia.  He has gained weight.       Objective:   Physical Exam VITAL SIGNS:  See vs page GENERAL: no distress Pulses: dorsalis pedis intact bilat.   Feet: no deformity.  no edema Skin:  no ulcer on the feet.  normal color and temp. Neuro: sensation is intact to touch on the feet Skin: multiple pigmented nevi  Lab  Results  Component Value Date   HGBA1C 6.8* 05/23/2014       Assessment & Plan:  DM: well-controlled. Weight gain: pt is encouraged to re-lose. 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   Patient is advised the following: Patient Instructions  check your blood sugar twice a day.  vary the time of day when you check, between before the 3 meals, and at bedtime.  also check if you have symptoms of your blood sugar being too high or too low.  please keep a record of the readings and bring it to your next appointment here.  please call us sooner if your blood sugar goes below 70, or if you have a lot of readings over 200.  Please come back for a follow-up appointment in 3 months.    blood tests are being requested for you today.   We'll contact you with results. Please continue the humalog, 175 units 3 times a day (just before each meal). However, when you are at work, take just 80 units.   i have sent a prescription to your pharmacy, for the skin cream.

## 2014-05-23 NOTE — Patient Instructions (Addendum)
check your blood sugar twice a day.  vary the time of day when you check, between before the 3 meals, and at bedtime.  also check if you have symptoms of your blood sugar being too high or too low.  please keep a record of the readings and bring it to your next appointment here.  please call us sooner if your blood sugar goes below 70, or if you have a lot of readings over 200.  Please come back for a follow-up appointment in 3 months.    blood tests are being requested for you today.  We'll contact you with results. Please continue the humalog, 175 units 3 times a day (just before each meal). However, when you are at work, take just 80 units.   i have sent a prescription to your pharmacy, for the skin cream.

## 2014-06-21 DIAGNOSIS — E119 Type 2 diabetes mellitus without complications: Secondary | ICD-10-CM | POA: Insufficient documentation

## 2014-06-21 NOTE — Progress Notes (Signed)
   Subjective:    Patient ID: Larry Rose, male    DOB: Jun 25, 1966, 48 y.o.   MRN: [id number]  HPI    Review of Systems     Objective:   Physical Exam        Assessment & Plan:  Diabetes is added as the proper dx.  Fetal malnutrition is an error, so it is removed

## 2014-08-01 ENCOUNTER — Other Ambulatory Visit: Payer: Self-pay | Admitting: Endocrinology

## 2014-08-23 ENCOUNTER — Encounter: Payer: Self-pay | Admitting: Endocrinology

## 2014-08-23 ENCOUNTER — Ambulatory Visit (INDEPENDENT_AMBULATORY_CARE_PROVIDER_SITE_OTHER): Payer: BLUE CROSS/BLUE SHIELD | Admitting: Endocrinology

## 2014-08-23 VITALS — BP 132/80 | HR 70 | Temp 98.0°F | Ht 69.0 in | Wt 292.0 lb

## 2014-08-23 DIAGNOSIS — Z0000 Encounter for general adult medical examination without abnormal findings: Secondary | ICD-10-CM

## 2014-08-23 DIAGNOSIS — E119 Type 2 diabetes mellitus without complications: Secondary | ICD-10-CM

## 2014-08-23 DIAGNOSIS — I10 Essential (primary) hypertension: Secondary | ICD-10-CM

## 2014-08-23 DIAGNOSIS — E785 Hyperlipidemia, unspecified: Secondary | ICD-10-CM

## 2014-08-23 LAB — BASIC METABOLIC PANEL
BUN: 14 mg/dL (ref 6–23)
CO2: 28 mEq/L (ref 19–32)
CREATININE: 0.88 mg/dL (ref 0.50–1.35)
Calcium: 9.3 mg/dL (ref 8.4–10.5)
Chloride: 97 mEq/L (ref 96–112)
Glucose, Bld: 203 mg/dL — ABNORMAL HIGH (ref 70–99)
Potassium: 4.7 mEq/L (ref 3.5–5.3)
Sodium: 136 mEq/L (ref 135–145)

## 2014-08-23 LAB — CBC WITH DIFFERENTIAL/PLATELET
Basophils Absolute: 0 10*3/uL (ref 0.0–0.1)
Basophils Relative: 0 % (ref 0–1)
EOS ABS: 0.3 10*3/uL (ref 0.0–0.7)
Eosinophils Relative: 4 % (ref 0–5)
HEMATOCRIT: 41.8 % (ref 39.0–52.0)
Hemoglobin: 14.3 g/dL (ref 13.0–17.0)
Lymphocytes Relative: 28 % (ref 12–46)
Lymphs Abs: 2.2 10*3/uL (ref 0.7–4.0)
MCH: 32.3 pg (ref 26.0–34.0)
MCHC: 34.2 g/dL (ref 30.0–36.0)
MCV: 94.4 fL (ref 78.0–100.0)
MPV: 11.3 fL (ref 8.6–12.4)
Monocytes Absolute: 0.6 10*3/uL (ref 0.1–1.0)
Monocytes Relative: 8 % (ref 3–12)
NEUTROS ABS: 4.7 10*3/uL (ref 1.7–7.7)
Neutrophils Relative %: 60 % (ref 43–77)
PLATELETS: 217 10*3/uL (ref 150–400)
RBC: 4.43 MIL/uL (ref 4.22–5.81)
RDW: 13.3 % (ref 11.5–15.5)
WBC: 7.8 10*3/uL (ref 4.0–10.5)

## 2014-08-23 LAB — HEPATIC FUNCTION PANEL
ALBUMIN: 3.8 g/dL (ref 3.5–5.2)
ALT: 42 U/L (ref 0–53)
AST: 30 U/L (ref 0–37)
Alkaline Phosphatase: 61 U/L (ref 39–117)
BILIRUBIN INDIRECT: 0.3 mg/dL (ref 0.2–1.2)
Bilirubin, Direct: 0.1 mg/dL (ref 0.0–0.3)
Total Bilirubin: 0.4 mg/dL (ref 0.2–1.2)
Total Protein: 6.4 g/dL (ref 6.0–8.3)

## 2014-08-23 LAB — LIPID PANEL
Cholesterol: 266 mg/dL — ABNORMAL HIGH (ref 0–200)
HDL: 28 mg/dL — ABNORMAL LOW (ref >39)
Total CHOL/HDL Ratio: 9.5 Ratio
Triglycerides: 760 mg/dL — ABNORMAL HIGH (ref <150)

## 2014-08-23 LAB — TSH: TSH: 3.505 u[IU]/mL (ref 0.350–4.500)

## 2014-08-23 NOTE — Progress Notes (Signed)
Subjective:    Patient ID: Larry Rose, male    DOB: Mar 31, 1966, 49 y.o.   MRN: [id number]  HPI  The state of at least three ongoing medical problems is addressed today, with interval history of each noted here: Pt returns for f/u of diabetes mellitus:  DM type: Insulin-requiring type 2 Dx'ed: 1993 Complications: none Therapy: insulin since 2009 DKA: never Severe hypoglycemia: never Pancreatitis: never Other: he takes multiple daily injections, and works rotating shifts; he has declined weight-loss surgery; he also declines U-500.   Interval history:  he is back on 3rd shift, no cbg record, but states cbg's vary from 80-mid-100's.  He seldom misses the insulin.  He denies hypoglycemia. Dyslipidemia: denies chest pain.  He does not take lofibra.  He drinks EtOH HTN: denies sob.   Past Medical History  Diagnosis Date  . TINEA CRURIS 11/07/2008  . DIABETES MELLITUS, TYPE II 04/01/2007  . HYPERLIPIDEMIA, ATHEROGENIC 09/29/2007  . ALCOHOLISM 11/30/2009  . CARPAL TUNNEL SYNDROME, RIGHT 10/06/2008  . HYPERTENSION 04/01/2007  . ALLERGIC RHINITIS 04/01/2007  . Rosacea 05/14/2010  . Hepatic steatosis     No past surgical history on file.  History   Social History  . Marital Status: Married    Spouse Name: N/A    Number of Children: N/A  . Years of Education: N/A   Occupational History  . Works Quarry manager    Social History Main Topics  . Smoking status: Never Smoker   . Smokeless tobacco: Not on file  . Alcohol Use: Not on file  . Drug Use: Not on file  . Sexual Activity: Not on file   Other Topics Concern  . Not on file   Social History Narrative    Current Outpatient Prescriptions on File Prior to Visit  Medication Sig Dispense Refill  . BD PEN NEEDLE NANO U/F 32G X 4 MM MISC USE 3 TIMES PER DAY. 300 each 1  . clotrimazole-betamethasone (LOTRISONE) cream Apply topically 2 (two) times daily as needed. For rash 30 g 1  . glucose blood (ONE TOUCH  ULTRA TEST) test strip 1 each by Other route 2 (two) times daily. And lancets 2/day 250.01 100 each 12  . insulin aspart (NOVOLOG FLEXPEN) 100 UNIT/ML FlexPen Inject 175 Units into the skin 3 (three) times daily with meals. And pen needles 5/day 180 mL 11  . lisinopril-hydrochlorothiazide (PRINZIDE,ZESTORETIC) 20-12.5 MG per tablet TAKE 1 TABLET BY MOUTH EVERY DAY 30 tablet 11  . Multiple Vitamin (MULTIVITAMIN) tablet Take 1 tablet by mouth daily.      Marland Kitchen NOVOLOG FLEXPEN 100 UNIT/ML FlexPen INJECT 150 UNITS INTO SKIN THREE TIMES A DAY WITH MEALS (9 BOXES) 135 pen 1  . triamcinolone cream (KENALOG) 0.1 % Apply 1 application topically 3 (three) times daily. As needed for itching 45 g 2   No current facility-administered medications on file prior to visit.    Allergies  Allergen Reactions  . Enalapril Maleate   . Lovastatin     Family History  Problem Relation Age of Onset  . Cancer Neg Hx     BP 132/80 mmHg  Pulse 70  Temp(Src) 98 F (36.7 C) (Oral)  Ht  (1.753 m)  Wt 292 lb (132.45 kg)  BMI 43.10 kg/m2  SpO2 97%   Review of Systems Denies numbness and weight change.      Objective:   Physical Exam VITAL SIGNS:  See vs page GENERAL: no distress.  Morbid obesity Pulses: dorsalis pedis intact  bilat.   MSK: no deformity of the feet. CV: no leg edema Skin:  no ulcer on the feet.  normal color and temp on the feet.  Neuro: sensation is intact to touch on the feet.    Lab Results  Component Value Date   HGBA1C 7.1* 08/23/2014   Lab Results  Component Value Date   CHOL 266* 08/23/2014   HDL 28* 08/23/2014   LDLCALC NOT CALC 08/23/2014   LDLDIRECT 92.8 05/23/2014   TRIG 760* 08/23/2014   CHOLHDL 9.5 08/23/2014       Assessment & Plan:  DM: good control. Occupational status: he works rotating shifts.  In this setting, he will probably have a better a1c when he works day shift. Dyslipidemia, persistent: therapy limited by noncompliance.  i'll do the best i can.    Alcoholism: this is worsening dyslipidemia.      Patient is advised the following: Patient Instructions  check your blood sugar twice a day.  vary the time of day when you check, between before the 3 meals, and at bedtime.  also check if you have symptoms of your blood sugar being too high or too low.  please keep a record of the readings and bring it to your next appointment here.  please call us sooner if your blood sugar goes below 70, or if you have a lot of readings over 200.  Please come back for a follow-up appointment in 3 months.    blood tests are being requested for you today.  We'll contact you with results. Please continue the humalog, 175 units 3 times a day (just before each meal). However, when you are at work, take just 80 units.   It is important to minimize alcohol intake, or avoid altogether.    addendum.  Please take fenofibrate.  i have sent a prescription to your pharmacy.

## 2014-08-23 NOTE — Patient Instructions (Addendum)
check your blood sugar twice a day.  vary the time of day when you check, between before the 3 meals, and at bedtime.  also check if you have symptoms of your blood sugar being too high or too low.  please keep a record of the readings and bring it to your next appointment here.  please call us sooner if your blood sugar goes below 70, or if you have a lot of readings over 200.  Please come back for a follow-up appointment in 3 months.    blood tests are being requested for you today.  We'll contact you with results. Please continue the humalog, 175 units 3 times a day (just before each meal). However, when you are at work, take just 80 units.   It is important to minimize alcohol intake, or avoid altogether.

## 2014-08-24 LAB — URINALYSIS, ROUTINE W REFLEX MICROSCOPIC
Bilirubin Urine: NEGATIVE
Glucose, UA: NEGATIVE mg/dL
HGB URINE DIPSTICK: NEGATIVE
KETONES UR: NEGATIVE mg/dL
Leukocytes, UA: NEGATIVE
Nitrite: NEGATIVE
PH: 5 (ref 5.0–8.0)
PROTEIN: NEGATIVE mg/dL
Specific Gravity, Urine: 1.017 (ref 1.005–1.030)
Urobilinogen, UA: 0.2 mg/dL (ref 0.0–1.0)

## 2014-08-24 LAB — HEMOGLOBIN A1C
Hgb A1c MFr Bld: 7.1 % — ABNORMAL HIGH (ref <5.7)
Mean Plasma Glucose: 157 mg/dL — ABNORMAL HIGH (ref <117)

## 2014-08-24 LAB — MICROALBUMIN / CREATININE URINE RATIO
Creatinine, Urine: 118 mg/dL
MICROALB/CREAT RATIO: 6.8 mg/g (ref 0.0–30.0)
Microalb, Ur: 0.8 mg/dL (ref <2.0)

## 2014-08-24 MED ORDER — FENOFIBRATE MICRONIZED 200 MG PO CAPS: 200.0000 mg | ORAL_CAPSULE | Freq: Every day | ORAL | Status: DC

## 2014-11-04 ENCOUNTER — Other Ambulatory Visit: Payer: Self-pay | Admitting: Endocrinology

## 2014-11-22 ENCOUNTER — Ambulatory Visit: Payer: Self-pay | Admitting: Endocrinology

## 2014-11-25 ENCOUNTER — Encounter: Payer: Self-pay | Admitting: Endocrinology

## 2014-11-25 ENCOUNTER — Ambulatory Visit (INDEPENDENT_AMBULATORY_CARE_PROVIDER_SITE_OTHER): Payer: BLUE CROSS/BLUE SHIELD | Admitting: Endocrinology

## 2014-11-25 VITALS — BP 118/70 | HR 84 | Temp 98.4°F | Ht 69.0 in | Wt 293.0 lb

## 2014-11-25 DIAGNOSIS — M79644 Pain in right finger(s): Secondary | ICD-10-CM | POA: Diagnosis not present

## 2014-11-25 DIAGNOSIS — M25571 Pain in right ankle and joints of right foot: Secondary | ICD-10-CM | POA: Diagnosis not present

## 2014-11-25 DIAGNOSIS — E119 Type 2 diabetes mellitus without complications: Secondary | ICD-10-CM

## 2014-11-25 DIAGNOSIS — M25579 Pain in unspecified ankle and joints of unspecified foot: Secondary | ICD-10-CM | POA: Insufficient documentation

## 2014-11-25 NOTE — Progress Notes (Signed)
Subjective:    Patient ID: Larry Rose, male    DOB: 1965-10-11, 49 y.o.   MRN: [id number]  HPI The state of at least three ongoing medical problems is addressed today, with interval history of each noted here: Pt returns for f/u of diabetes mellitus:  DM type: Insulin-requiring type 2 Dx'ed: 1993 Complications: none Therapy: insulin since 2009 DKA: never Severe hypoglycemia: never Pancreatitis: never Other: he takes multiple daily injections, and works rotating shifts; he has declined weight-loss surgery; he also declines U-500.   Interval history: he works 3rd shift, no cbg record, but states cbg's are well-controlled.  He seldom misses the insulin.   Pt states few weeks of intermittent right ankle pain.  No assoc rash.   Past Medical History  Diagnosis Date  . TINEA CRURIS 11/07/2008  . DIABETES MELLITUS, TYPE II 04/01/2007  . HYPERLIPIDEMIA, ATHEROGENIC 09/29/2007  . ALCOHOLISM 11/30/2009  . CARPAL TUNNEL SYNDROME, RIGHT 10/06/2008  . HYPERTENSION 04/01/2007  . ALLERGIC RHINITIS 04/01/2007  . Rosacea 05/14/2010  . Hepatic steatosis     No past surgical history on file.  History   Social History  . Marital Status: Married    Spouse Name: N/A  . Number of Children: N/A  . Years of Education: N/A   Occupational History  . Works Quarry manager    Social History Main Topics  . Smoking status: Never Smoker   . Smokeless tobacco: Not on file  . Alcohol Use: Not on file  . Drug Use: Not on file  . Sexual Activity: Not on file   Other Topics Concern  . Not on file   Social History Narrative    Current Outpatient Prescriptions on File Prior to Visit  Medication Sig Dispense Refill  . BD PEN NEEDLE NANO U/F 32G X 4 MM MISC USE 3 TIMES PER DAY. 300 each 2  . clotrimazole-betamethasone (LOTRISONE) cream Apply topically 2 (two) times daily as needed. For rash 30 g 1  . glucose blood (ONE TOUCH ULTRA TEST) test strip 1 each by Other route 2 (two) times  daily. And lancets 2/day 250.01 100 each 12  . insulin aspart (NOVOLOG FLEXPEN) 100 UNIT/ML FlexPen Inject 175 Units into the skin 3 (three) times daily with meals. And pen needles 5/day 180 mL 11  . lisinopril-hydrochlorothiazide (PRINZIDE,ZESTORETIC) 20-12.5 MG per tablet TAKE 1 TABLET BY MOUTH EVERY DAY 30 tablet 11  . Multiple Vitamin (MULTIVITAMIN) tablet Take 1 tablet by mouth daily.      Marland Kitchen NOVOLOG FLEXPEN 100 UNIT/ML FlexPen INJECT 150 UNITS INTO SKIN THREE TIMES A DAY WITH MEALS (9 BOXES) 135 pen 1  . triamcinolone cream (KENALOG) 0.1 % Apply 1 application topically 3 (three) times daily. As needed for itching 45 g 2  . fenofibrate micronized (LOFIBRA) 200 MG capsule Take 1 capsule (200 mg total) by mouth daily before breakfast. (Patient not taking: Reported on 11/25/2014) 30 capsule 11   No current facility-administered medications on file prior to visit.    Allergies  Allergen Reactions  . Enalapril Maleate   . Lovastatin     Family History  Problem Relation Age of Onset  . Cancer Neg Hx     BP 118/70 mmHg  Pulse 84  Temp(Src) 98.4 F (36.9 C) (Oral)  Ht  (1.753 m)  Wt 293 lb (132.904 kg)  BMI 43.25 kg/m2  SpO2 97%   Review of Systems He has few mos of pain at there right index finger, since a minor injury.  He denies hypoglycemia.     Objective:   Physical Exam VITAL SIGNS:  See vs page. GENERAL: no distress. Pulses: dorsalis pedis intact bilat.   MSK: no deformity of the feet.  Right medial malleolus is nontender.  CV: no leg edema.  Skin:  no ulcer on the feet.  normal color and temp on the feet. Neuro: sensation is intact to touch on the feet.   Ext: right index fingernail has slight swelling and ecchymosis.        Assessment & Plan:  Ankle pain, new, ? Gout DM: apparently well-controlled Finger contusion, with persistent pain  Patient is advised the following: Patient Instructions  blood tests and x-rays are requested for you today.  We'll let  you know about the results. Please see a dietician specialist.  you will receive a phone call, about a day and time for an appointment. Please also let me know if you want to see a specialist for your ankle or or your finger.   check your blood sugar twice a day.  vary the time of day when you check, between before the 3 meals, and at bedtime.  also check if you have symptoms of your blood sugar being too high or too low.  please keep a record of the readings and bring it to your next appointment here.  please call us sooner if your blood sugar goes below 70, or if you have a lot of readings over 200.  Please come back for a follow-up appointment in 3 months.

## 2014-11-25 NOTE — Patient Instructions (Addendum)
blood tests and x-rays are requested for you today.  We'll let you know about the results. Please see a dietician specialist.  you will receive a phone call, about a day and time for an appointment. Please also let me know if you want to see a specialist for your ankle or or your finger.   check your blood sugar twice a day.  vary the time of day when you check, between before the 3 meals, and at bedtime.  also check if you have symptoms of your blood sugar being too high or too low.  please keep a record of the readings and bring it to your next appointment here.  please call us sooner if your blood sugar goes below 70, or if you have a lot of readings over 200.  Please come back for a follow-up appointment in 3 months.

## 2014-11-28 ENCOUNTER — Other Ambulatory Visit: Payer: BLUE CROSS/BLUE SHIELD

## 2014-11-28 ENCOUNTER — Other Ambulatory Visit: Payer: Self-pay | Admitting: Endocrinology

## 2014-11-28 ENCOUNTER — Other Ambulatory Visit (INDEPENDENT_AMBULATORY_CARE_PROVIDER_SITE_OTHER): Payer: BLUE CROSS/BLUE SHIELD

## 2014-11-28 ENCOUNTER — Ambulatory Visit
Admission: RE | Admit: 2014-11-28 | Discharge: 2014-11-28 | Disposition: A | Discharge status: Home / Self Care | Payer: BLUE CROSS/BLUE SHIELD | Source: Ambulatory Visit | Attending: Endocrinology | Admitting: Endocrinology

## 2014-11-28 DIAGNOSIS — M79644 Pain in right finger(s): Secondary | ICD-10-CM

## 2014-11-28 DIAGNOSIS — M25571 Pain in right ankle and joints of right foot: Secondary | ICD-10-CM

## 2014-11-28 LAB — URIC ACID: Uric Acid, Serum: 8.1 mg/dL — ABNORMAL HIGH (ref 4.0–7.8)

## 2014-11-28 LAB — SEDIMENTATION RATE: Sed Rate: 25 mm/hr — ABNORMAL HIGH (ref 0–22)

## 2014-11-28 LAB — HEMOGLOBIN A1C: Hgb A1c MFr Bld: 7.1 % — ABNORMAL HIGH (ref 4.6–6.5)

## 2014-11-28 MED ORDER — ALLOPURINOL 100 MG PO TABS: 100.0000 mg | ORAL_TABLET | Freq: Every day | ORAL | Status: DC

## 2014-11-29 ENCOUNTER — Encounter: Payer: BLUE CROSS/BLUE SHIELD | Attending: Endocrinology | Admitting: Nutrition

## 2014-11-29 DIAGNOSIS — E119 Type 2 diabetes mellitus without complications: Secondary | ICD-10-CM | POA: Insufficient documentation

## 2014-11-29 DIAGNOSIS — Z713 Dietary counseling and surveillance: Secondary | ICD-10-CM | POA: Insufficient documentation

## 2014-11-29 DIAGNOSIS — Z794 Long term (current) use of insulin: Secondary | ICD-10-CM | POA: Insufficient documentation

## 2014-11-30 ENCOUNTER — Other Ambulatory Visit: Payer: Self-pay | Admitting: Endocrinology

## 2014-11-30 ENCOUNTER — Other Ambulatory Visit: Payer: Self-pay

## 2014-11-30 MED ORDER — INSULIN ASPART 100 UNIT/ML FLEXPEN: PEN_INJECTOR | SUBCUTANEOUS | Status: DC

## 2014-12-08 ENCOUNTER — Encounter: Payer: BLUE CROSS/BLUE SHIELD | Admitting: Dietician

## 2014-12-08 LAB — HM DIABETES EYE EXAM

## 2014-12-15 ENCOUNTER — Encounter: Payer: Self-pay | Admitting: Dietician

## 2014-12-15 ENCOUNTER — Encounter: Payer: BLUE CROSS/BLUE SHIELD | Admitting: Dietician

## 2014-12-15 VITALS — Ht 69.0 in | Wt 286.0 lb

## 2014-12-15 DIAGNOSIS — E119 Type 2 diabetes mellitus without complications: Secondary | ICD-10-CM

## 2014-12-15 DIAGNOSIS — Z713 Dietary counseling and surveillance: Secondary | ICD-10-CM | POA: Diagnosis not present

## 2014-12-15 DIAGNOSIS — Z794 Long term (current) use of insulin: Secondary | ICD-10-CM | POA: Diagnosis not present

## 2014-12-15 NOTE — Progress Notes (Signed)
  Medical Nutrition Therapy:  Appt start time: 1530 end time:  1645.  Assessment:  Primary concerns today: Patient would like to lose weight.   Hx includes HTN, hyperlipidemia, and type 2 diabetes for the past 25 years.  He does not check his blood sugar often.  He is here alone. He has lost 7 lbs in the past 3 weeks.  BMI poor indicator of weight status due to large bone and muscle mass.  Patient goal is to lose about 60 lbs. Lipids noted.  HgbA1C 7.1% 11/28/14.  Patient lives with wife.  Wife does the shopping and cooking.  Wife struggles with weight, has recently lost 30 lbs, is on meds for pre diabetes.  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  Often only sleeps 5.5 hours per day.  Occasional naps.  TANITA  BODY COMP RESULTS 12/15/14 282.5 lbs   BMI (kg/m^2) 41.7   Fat Mass (lbs) 127.5   Fat Free Mass (lbs) 155 lbs   Total Body Water (lbs) 113.5 lbs    Preferred Learning Style:   No preference indicated   Learning Readiness:   Ready  MEDICATIONS: see list to include novolog flexpen.   DIETARY INTAKE: 24-hr recall:  B ( 7:30-8AM): egg sandwich with or without cheese and pickled nopal cactus on [employer] toast  Or Grape nuts with almond milk Snk ( AM):   L ( PM): [employer] rice and beans Snk ( PM):  D (9-10:30 PM): stuffed chicken or ribs, potatoes or other starch, dried beans, coleslaw (vinegar) Snk ( PM): celery or apple with peanut butter, or cheese its or peanut butter crackers, or tortilla chips and salsa Beverages: water, sweet tea (stevia or honey), diet sun drop, 6 pack of beer 3 times per week  Usual physical activity: Stands 10-12 hours while at work  Estimated energy needs: 2000 calories 225 g carbohydrates 125 g protein 67 g fat  Progress Towards Goal(s):  In progress.   Nutritional Diagnosis:  NB-1.1 Food and nutrition-related knowledge deficit As related to balance of carbohydrates, proteins, and fats.  As evidenced by patent report.    Intervention:   Nutrition counseling and diabetes education initiated. Discussed Carb Counting by food group as method of portion control, reading food labels, and benefits of increased activity.  Also discussed nutrition and weight loss.  Consider increasing exercise.  Aim for 30 minutes on work days and 30 minutes to 1 hour on days off.   Aim for 3 Carb Choices per meal (45 grams) +/- 1 either way  Aim for 0-2 Carbs per snack if hungry  Include protein in moderation with your meals and snacks Consider reading food labels for Total Carbohydrate and Fat Grams of foods Bake or grill rather than fried. Consider meatless meals at times.   Teaching Method Utilized:  Visual Auditory Hands on  Handouts given during visit include:  Meal plan card  Label reading  Snack list  Barriers to learning/adherence to lifestyle change: none  Demonstrated degree of understanding via:  Teach Back   Monitoring/Evaluation:  Dietary intake, exercise, label reading, and body weight in 2 month(s).

## 2014-12-15 NOTE — Patient Instructions (Signed)
Consider increasing exercise.  Aim for 30 minutes on work days and 30 minutes to 1 hour on days off.   Aim for 3 Carb Choices per meal (45 grams) +/- 1 either way  Aim for 0-2 Carbs per snack if hungry  Include protein in moderation with your meals and snacks Consider reading food labels for Total Carbohydrate and Fat Grams of foods Bake or grill rather than fried. Consider meatless meals at times.

## 2015-02-24 ENCOUNTER — Ambulatory Visit (INDEPENDENT_AMBULATORY_CARE_PROVIDER_SITE_OTHER): Payer: BLUE CROSS/BLUE SHIELD | Admitting: Endocrinology

## 2015-02-24 ENCOUNTER — Encounter: Payer: Self-pay | Admitting: Endocrinology

## 2015-02-24 ENCOUNTER — Other Ambulatory Visit: Payer: Self-pay

## 2015-02-24 ENCOUNTER — Encounter: Payer: BLUE CROSS/BLUE SHIELD | Attending: Endocrinology | Admitting: Dietician

## 2015-02-24 VITALS — Wt 289.0 lb

## 2015-02-24 VITALS — BP 132/86 | HR 63 | Temp 97.9°F | Ht 69.0 in | Wt 289.0 lb

## 2015-02-24 DIAGNOSIS — Z713 Dietary counseling and surveillance: Secondary | ICD-10-CM | POA: Diagnosis not present

## 2015-02-24 DIAGNOSIS — E119 Type 2 diabetes mellitus without complications: Secondary | ICD-10-CM | POA: Insufficient documentation

## 2015-02-24 DIAGNOSIS — Z794 Long term (current) use of insulin: Secondary | ICD-10-CM | POA: Insufficient documentation

## 2015-02-24 LAB — POCT GLYCOSYLATED HEMOGLOBIN (HGB A1C): Hemoglobin A1C: 6.8

## 2015-02-24 MED ORDER — FENOFIBRATE 54 MG PO TABS: 54.0000 mg | ORAL_TABLET | Freq: Every day | ORAL | Status: DC

## 2015-02-24 NOTE — Progress Notes (Signed)
Medical Nutrition Therapy:  Appt start time: 1530 end time:  1645.  Assessment:  12/15/14 Primary concerns today: Patient would like to lose weight.   Hx includes HTN, hyperlipidemia, and type 2 diabetes for the past 25 years.  He does not check his blood sugar often.  He is here alone. He has lost 7 lbs in the past 3 weeks.  BMI poor indicator of weight status due to large bone and muscle mass.  Patient goal is to lose about 60 lbs. Lipids noted.  HgbA1C 7.1% 11/28/14.  Patient lives with wife.  Wife does the shopping and cooking.  Wife struggles with weight, has recently lost 30 lbs, is on meds for pre diabetes.  Works nights at [employer].  Often only sleeps 5.5 hours per day.  Occasional naps.  TANITA  BODY COMP RESULTS 12/15/14 282.5 lbs   BMI (kg/m^2) 41.7   Fat Mass (lbs) 127.5   Fat Free Mass (lbs) 155 lbs   Total Body Water (lbs) 113.5 lbs   02/24/15: Patient is here alone.  He states that eating habits are hard to manage secondary to working third shift.  He has made some changes in his eating habits to decrease fried foods and eating out as well as changing what he snacks on. He has lost body fat with currently weight higher due to fluid.  He walks 1-2 times per week but reports lacking motivation to do more.   He was able to list several things that he enjoys doing. Still unable to sleep regularly.    He is under increased stress with job and house problems.    TANITA  BODY COMP RESULTS 02/24/15 285 lbs   BMI (kg/m^2) 42.1   Fat Mass (lbs) 119   Fat Free Mass (lbs) 166   Total Body Water (lbs) 121.5    Preferred Learning Style:   No preference indicated   Learning Readiness:   Ready  MEDICATIONS: see list to include novolog flexpen.   DIETARY INTAKE: 24-hr recall:  B ( 7:30-8AM): egg sandwich with or without cheese and pickled nopal cactus on [employer] toast  Or Grape nuts with almond milk Snk ( AM):   L ( PM): Sandwich from home  Snk ( PM):  D  (9-10:30 PM): stuffed chicken or ribs, potatoes or other starch, dried beans, coleslaw (vinegar) Snk ( PM): celery or apple with peanut butter, or almonds or baked tortilla chips and salsa Beverages: water, sweet tea (stevia or honey), diet sun drop, 6 pack of beer 3 times per week  Usual physical activity: Stands 10-12 hours while at work  Estimated energy needs: 2000 calories 225 g carbohydrates 125 g protein 67 g fat  Progress Towards Goal(s):  In progress.   Nutritional Diagnosis:  NB-1.1 Food and nutrition-related knowledge deficit As related to balance of carbohydrates, proteins, and fats.  As evidenced by patent report.    Intervention:  Nutrition counseling and diabetes education continued as well as mindful eating and tips to lose weight.    Be as active as possible.  Start a habit.  Consider the [employer] or use the [place] or Treadmill that you have at home. Dancing.  Tennis. Be mindful when eating.  Before a snack ask "Am I hungry?"   Continue to watch your portion sizes.   Aim for 3 carb choices at each meal. Aim for 0-1 carb per meal if hungry.   Teaching Method Utilized:  Visual Auditory  Handouts given during visit  include:  Snack list  Barriers to learning/adherence to lifestyle change: none  Demonstrated degree of understanding via:  Teach Back   Monitoring/Evaluation:  Dietary intake, exercise, label reading, and body weight prn

## 2015-02-24 NOTE — Patient Instructions (Signed)
Be as active as possible.  Start a habit.  Consider the [employer] or use the [place] or Treadmill that you have at home. Dancing.  Tennis. Be mindful when eating.  Before a snack ask "Am I hungry?"   Continue to watch your portion sizes.   Aim for 3 carb choices at each meal. Aim for 0-1 carb per meal if hungry.

## 2015-02-24 NOTE — Patient Instructions (Addendum)
check your blood sugar twice a day.  vary the time of day when you check, between before the 3 meals, and at bedtime.  also check if you have symptoms of your blood sugar being too high or too low.  please keep a record of the readings and bring it to your next appointment here.  please call us sooner if your blood sugar goes below 70, or if you have a lot of readings over 200.  Please continue the same insulin.   Please come back for a follow-up appointment in 3 months.    i have sent a prescription to your pharmacy, to resume the fenofibrate.

## 2015-02-24 NOTE — Progress Notes (Signed)
Subjective:    Patient ID: Larry FerrariBrian S Rose, male    DOB: 06/08/1966, 49 y.o.   MRN: [id number]  HPI Pt returns for f/u of diabetes mellitus:  DM type: Insulin-requiring type 2 Dx'ed: 1993 Complications: none Therapy: insulin since 2009.  DKA: never Severe hypoglycemia: never Pancreatitis: never Other: he takes multiple daily injections, and works rotating shifts; he has declined weight-loss surgery; he also declines U-500.   Interval history: he works 3rd shift, no cbg record, but states cbg's are well-controlled.  He take the insulin as rx'ed.   He agrees to take only a lower dosage of fenofibrate. Past Medical History  Diagnosis Date  . TINEA CRURIS 11/07/2008  . DIABETES MELLITUS, TYPE II 04/01/2007  . HYPERLIPIDEMIA, ATHEROGENIC 09/29/2007  . ALCOHOLISM 11/30/2009  . CARPAL TUNNEL SYNDROME, RIGHT 10/06/2008  . HYPERTENSION 04/01/2007  . ALLERGIC RHINITIS 04/01/2007  . Rosacea 05/14/2010  . Hepatic steatosis     Past Surgical History  Procedure Laterality Date  . Carpel tunnel    . Fracture surgery      History   Social History  . Marital Status: Married    Spouse Name: N/A  . Number of Children: N/A  . Years of Education: N/A   Occupational History  . Works [employer]    Social History Main Topics  . Smoking status: Never Smoker   . Smokeless tobacco: Not on file  . Alcohol Use: Not on file  . Drug Use: Not on file  . Sexual Activity: Not on file   Other Topics Concern  . Not on file   Social History Narrative    Current Outpatient Prescriptions on File Prior to Visit  Medication Sig Dispense Refill  . BD PEN NEEDLE NANO U/F 32G X 4 MM MISC USE 3 TIMES PER DAY. 300 each 2  . glucose blood (ONE TOUCH ULTRA TEST) test strip 1 each by Other route 2 (two) times daily. And lancets 2/day 250.01 100 each 12  . lisinopril-hydrochlorothiazide (PRINZIDE,ZESTORETIC) 20-12.5 MG per tablet TAKE 1 TABLET BY MOUTH EVERY DAY 30 tablet 11  . Multiple  Vitamin (MULTIVITAMIN) tablet Take 1 tablet by mouth daily.      Marland Kitchen. triamcinolone cream (KENALOG) 0.1 % Apply 1 application topically 3 (three) times daily. As needed for itching 45 g 2  . allopurinol (ZYLOPRIM) 100 MG tablet Take 1 tablet (100 mg total) by mouth daily. (Patient not taking: Reported on 02/24/2015) 30 tablet 6   No current facility-administered medications on file prior to visit.    Allergies  Allergen Reactions  . Enalapril Maleate   . Lovastatin     Family History  Problem Relation Age of Onset  . Cancer Neg Hx     BP 132/86 mmHg  Pulse 63  Temp(Src) 97.9 F (36.6 C) (Oral)  Ht 5\' 9"  (1.753 m)  Wt 289 lb (131.09 kg)  BMI 42.66 kg/m2  SpO2 95%  Review of Systems He denies hypoglycemia    Objective:   Physical Exam VITAL SIGNS:  See vs page GENERAL: no distress Pulses: dorsalis pedis intact bilat.   MSK: no deformity of the feet CV: 1+ bilat leg edema Skin:  no ulcer on the feet.  normal color and temp on the feet. Neuro: sensation is intact to touch on the feet.     A1c=6.8%  Lab Results  Component Value Date   CHOL 266* 08/23/2014   HDL 28* 08/23/2014   LDLCALC NOT CALC 08/23/2014   LDLDIRECT 92.8 05/23/2014  TRIG 760* 08/23/2014   CHOLHDL 9.5 08/23/2014       Assessment & Plan:  DM: well-controlled.  Please continue the same insulin Dyslipidemia, worse off fenofibrate.   Patient is advised the following: Patient Instructions  check your blood sugar twice a day.  vary the time of day when you check, between before the 3 meals, and at bedtime.  also check if you have symptoms of your blood sugar being too high or too low.  please keep a record of the readings and bring it to your next appointment here.  please call us sooner if your blood sugar goes below 70, or if you have a lot of readings over 200.  Please continue the same insulin.   Please come back for a follow-up appointment in 3 months.    i have sent a prescription to your  pharmacy, to resume the fenofibrate.

## 2015-05-29 ENCOUNTER — Ambulatory Visit (INDEPENDENT_AMBULATORY_CARE_PROVIDER_SITE_OTHER): Payer: BLUE CROSS/BLUE SHIELD | Admitting: Endocrinology

## 2015-05-29 ENCOUNTER — Encounter: Payer: Self-pay | Admitting: Endocrinology

## 2015-05-29 ENCOUNTER — Other Ambulatory Visit: Payer: Self-pay | Admitting: Endocrinology

## 2015-05-29 VITALS — BP 142/94 | HR 70 | Temp 97.6°F | Ht 69.0 in | Wt 288.0 lb

## 2015-05-29 DIAGNOSIS — E119 Type 2 diabetes mellitus without complications: Secondary | ICD-10-CM

## 2015-05-29 LAB — POCT GLYCOSYLATED HEMOGLOBIN (HGB A1C): Hemoglobin A1C: 6.9

## 2015-05-29 NOTE — Progress Notes (Signed)
Subjective:    Patient ID: Larry Rose, male    DOB: 1965/10/10, 49 y.o.   MRN: [id number]  HPI Pt returns for f/u of diabetes mellitus:  DM type: Insulin-requiring type 2 Dx'ed: 1993 Complications: none Therapy: insulin since 2009.  DKA: never Severe hypoglycemia: never Pancreatitis: never Other: he takes multiple daily injections, and works rotating shifts; he has declined weight-loss surgery; he also declines U-500.   Interval history: he works 3rd shift; no cbg record, but states cbg's are well-controlled, except is is slightly low after eating during his overnight shift.  He takes the insulin as rx'ed.   Past Medical History  Diagnosis Date  . TINEA CRURIS 11/07/2008  . DIABETES MELLITUS, TYPE II 04/01/2007  . HYPERLIPIDEMIA, ATHEROGENIC 09/29/2007  . ALCOHOLISM 11/30/2009  . CARPAL TUNNEL SYNDROME, RIGHT 10/06/2008  . HYPERTENSION 04/01/2007  . ALLERGIC RHINITIS 04/01/2007  . Rosacea 05/14/2010  . Hepatic steatosis     Past Surgical History  Procedure Laterality Date  . Carpel tunnel    . Fracture surgery      Social History   Social History  . Marital Status: Married    Spouse Name: N/A  . Number of Children: N/A  . Years of Education: N/A   Occupational History  . Works Quarry manager    Social History Main Topics  . Smoking status: Never Smoker   . Smokeless tobacco: Not on file  . Alcohol Use: Not on file  . Drug Use: Not on file  . Sexual Activity: Not on file   Other Topics Concern  . Not on file   Social History Narrative    Current Outpatient Prescriptions on File Prior to Visit  Medication Sig Dispense Refill  . BD PEN NEEDLE NANO U/F 32G X 4 MM MISC USE 3 TIMES PER DAY. 300 each 2  . glucose blood (ONE TOUCH ULTRA TEST) test strip 1 each by Other route 2 (two) times daily. And lancets 2/day 250.01 100 each 12  . insulin aspart (NOVOLOG FLEXPEN) 100 UNIT/ML FlexPen Inject 180 Units into the skin 3 (three) times daily with meals.  And pen needles 3/day    . lisinopril-hydrochlorothiazide (PRINZIDE,ZESTORETIC) 20-12.5 MG per tablet TAKE 1 TABLET BY MOUTH EVERY DAY 30 tablet 11  . Multiple Vitamin (MULTIVITAMIN) tablet Take 1 tablet by mouth daily.      Marland Kitchen triamcinolone cream (KENALOG) 0.1 % Apply 1 application topically 3 (three) times daily. As needed for itching 45 g 2  . allopurinol (ZYLOPRIM) 100 MG tablet Take 1 tablet (100 mg total) by mouth daily. (Patient not taking: Reported on 02/24/2015) 30 tablet 6  . fenofibrate 54 MG tablet Take 1 tablet (54 mg total) by mouth daily. (Patient not taking: Reported on 05/29/2015) 30 tablet 11   No current facility-administered medications on file prior to visit.    Allergies  Allergen Reactions  . Enalapril Maleate   . Lovastatin     Family History  Problem Relation Age of Onset  . Cancer Neg Hx     BP 142/94 mmHg  Pulse 70  Temp(Src) 97.6 F (36.4 C) (Oral)  Ht  (1.753 m)  Wt 288 lb (130.636 kg)  BMI 42.51 kg/m2  SpO2 96%  Review of Systems Denies LOC    Objective:   Physical Exam VITAL SIGNS:  See vs page GENERAL: no distress Pulses: dorsalis pedis intact bilat.   MSK: no deformity of the feet CV: no leg edema Skin:  no ulcer on the  feet.  normal color and temp on the feet. Neuro: sensation is intact to touch on the feet   Lab Results  Component Value Date   HGBA1C 6.9 05/29/2015       Assessment & Plan:  DM: slightly overcontrolled. HTN: with probable situational component--we'll recheck next time.    Patient is advised the following: Patient Instructions  check your blood sugar twice a day.  vary the time of day when you check, between before the 3 meals, and at bedtime.  also check if you have symptoms of your blood sugar being too high or too low.  please keep a record of the readings and bring it to your next appointment here.  please call us sooner if your blood sugar goes below 70, or if you have a lot of readings over 200.     Please continue the same insulin, except take just half as much with you meal at work.  Please come back for a regular physical appointment in 3 months.

## 2015-05-29 NOTE — Patient Instructions (Addendum)
check your blood sugar twice a day.  vary the time of day when you check, between before the 3 meals, and at bedtime.  also check if you have symptoms of your blood sugar being too high or too low.  please keep a record of the readings and bring it to your next appointment here.  please call us sooner if your blood sugar goes below 70, or if you have a lot of readings over 200.   Please continue the same insulin, except take just half as much with you meal at work.  Please come back for a regular physical appointment in 3 months.

## 2015-05-30 DIAGNOSIS — Z7689 Persons encountering health services in other specified circumstances: Secondary | ICD-10-CM

## 2015-07-03 ENCOUNTER — Other Ambulatory Visit: Payer: Self-pay | Admitting: Endocrinology

## 2015-08-01 ENCOUNTER — Other Ambulatory Visit: Payer: Self-pay | Admitting: Endocrinology

## 2015-09-11 ENCOUNTER — Telehealth: Payer: Self-pay | Admitting: Endocrinology

## 2015-09-11 NOTE — Telephone Encounter (Signed)
Patient called stating that his Rx is costly   Rx: Novolog was $70 now costs $200 Does the vials change the price? Is there a different medication he can use?   Please advise    Thank you

## 2015-09-11 NOTE — Telephone Encounter (Signed)
I contacted the pt and advised the best alternative would be the novolog discount card. Novolog is the lowest tier on his insurance. Pt voiced understanding.

## 2015-09-15 ENCOUNTER — Other Ambulatory Visit: Payer: Self-pay | Admitting: Endocrinology

## 2015-09-29 ENCOUNTER — Ambulatory Visit: Payer: BLUE CROSS/BLUE SHIELD | Admitting: Endocrinology

## 2015-10-06 ENCOUNTER — Ambulatory Visit (INDEPENDENT_AMBULATORY_CARE_PROVIDER_SITE_OTHER): Payer: BLUE CROSS/BLUE SHIELD | Admitting: Endocrinology

## 2015-10-06 ENCOUNTER — Encounter: Payer: Self-pay | Admitting: Endocrinology

## 2015-10-06 VITALS — BP 136/88 | HR 69 | Temp 97.6°F | Ht 69.0 in | Wt 291.0 lb

## 2015-10-06 DIAGNOSIS — E119 Type 2 diabetes mellitus without complications: Secondary | ICD-10-CM

## 2015-10-06 LAB — POCT GLYCOSYLATED HEMOGLOBIN (HGB A1C): Hemoglobin A1C: 7.6

## 2015-10-06 NOTE — Progress Notes (Signed)
Subjective:    Patient ID: Larry Rose, male    DOB: 1965-11-02, 50 y.o.   MRN: [id number]  HPI Pt returns for f/u of diabetes mellitus:  DM type: Insulin-requiring type 2 Dx'ed: 1993 Complications: none Therapy: insulin since 2009.  DKA: never Severe hypoglycemia: never Pancreatitis: never Other: he takes multiple daily injections, and works rotating shifts; he has declined weight-loss surgery; he also declines U-500.   Interval history: he works 3rd shift; he seldom checks cbg's.  He takes the insulin as rx'ed.  He denies hypoglycemia.  Past Medical History  Diagnosis Date  . TINEA CRURIS 11/07/2008  . DIABETES MELLITUS, TYPE II 04/01/2007  . HYPERLIPIDEMIA, ATHEROGENIC 09/29/2007  . ALCOHOLISM 11/30/2009  . CARPAL TUNNEL SYNDROME, RIGHT 10/06/2008  . HYPERTENSION 04/01/2007  . ALLERGIC RHINITIS 04/01/2007  . Rosacea 05/14/2010  . Hepatic steatosis     Past Surgical History  Procedure Laterality Date  . Carpel tunnel    . Fracture surgery      Social History   Social History  . Marital Status: Married    Spouse Name: N/A  . Number of Children: N/A  . Years of Education: N/A   Occupational History  . Works Quarry manager    Social History Main Topics  . Smoking status: Never Smoker   . Smokeless tobacco: Not on file  . Alcohol Use: Not on file  . Drug Use: Not on file  . Sexual Activity: Not on file   Other Topics Concern  . Not on file   Social History Narrative    Current Outpatient Prescriptions on File Prior to Visit  Medication Sig Dispense Refill  . BD PEN NEEDLE NANO U/F 32G X 4 MM MISC USE 3 TIMES PER DAY. 300 each 2  . glucose blood (ONE TOUCH ULTRA TEST) test strip 1 each by Other route 2 (two) times daily. And lancets 2/day 250.01 100 each 12  . insulin aspart (NOVOLOG FLEXPEN) 100 UNIT/ML FlexPen Inject 190 Units into the skin 3 (three) times daily with meals. And pen needles 3/day    . lisinopril-hydrochlorothiazide  (PRINZIDE,ZESTORETIC) 20-12.5 MG tablet TAKE 1 TABLET BY MOUTH EVERY DAY 30 tablet 11  . Multiple Vitamin (MULTIVITAMIN) tablet Take 1 tablet by mouth daily.      Marland Kitchen NOVOLOG FLEXPEN 100 UNIT/ML FlexPen INJECT 180 UNITS INTO THE SKIN 3 (THREE) TIMES DAILY WITH MEALS 165 pen 3  . allopurinol (ZYLOPRIM) 100 MG tablet Take 1 tablet (100 mg total) by mouth daily. (Patient not taking: Reported on 02/24/2015) 30 tablet 6  . fenofibrate 54 MG tablet Take 1 tablet (54 mg total) by mouth daily. (Patient not taking: Reported on 05/29/2015) 30 tablet 11  . triamcinolone cream (KENALOG) 0.1 % Apply 1 application topically 3 (three) times daily. As needed for itching (Patient not taking: Reported on 10/06/2015) 45 g 2   No current facility-administered medications on file prior to visit.    Allergies  Allergen Reactions  . Enalapril Maleate   . Lovastatin     Family History  Problem Relation Age of Onset  . Cancer Neg Hx     BP 136/88 mmHg  Pulse 69  Temp(Src) 97.6 F (36.4 C) (Oral)  Ht  (1.753 m)  Wt 291 lb (131.997 kg)  BMI 42.95 kg/m2  SpO2 97%  Review of Systems He has lost a few lbs.     Objective:   Physical Exam VITAL SIGNS:  See vs page GENERAL: no distress Pulses: dorsalis pedis  intact bilat.   MSK: no deformity of the feet CV: trace bilat leg edema.  Skin:  no ulcer on the feet.  normal color and temp on the feet. Neuro: sensation is intact to touch on the feet  2 skin tags on the right neck removed, with a battery-powered cauterizer.   A1c=7.6%    Assessment & Plan:  DM: glycemic control is worse Occupational state: he needs to adjust insulin for this.    Patient is advised the following: Patient Instructions  check your blood sugar twice a day.  vary the time of day when you check, between before the 3 meals, and at bedtime.  also check if you have symptoms of your blood sugar being too high or too low.  please keep a record of the readings and bring it to your  next appointment here.  please call us sooner if your blood sugar goes below 70, or if you have a lot of readings over 200.   Please increase the insulin to 190 units 3 times a day (just before each meal), but half as much when you are at work.   Please come back for a regular physical appointment in 3 months.

## 2015-10-06 NOTE — Patient Instructions (Addendum)
check your blood sugar twice a day.  vary the time of day when you check, between before the 3 meals, and at bedtime.  also check if you have symptoms of your blood sugar being too high or too low.  please keep a record of the readings and bring it to your next appointment here.  please call us sooner if your blood sugar goes below 70, or if you have a lot of readings over 200.   Please increase the insulin to 190 units 3 times a day (just before each meal), but half as much when you are at work.   Please come back for a regular physical appointment in 3 months.

## 2015-12-21 LAB — HM DIABETES EYE EXAM

## 2015-12-29 ENCOUNTER — Encounter: Payer: Self-pay | Admitting: Geriatric Medicine

## 2016-01-03 ENCOUNTER — Encounter: Payer: Self-pay | Admitting: Endocrinology

## 2016-01-03 ENCOUNTER — Ambulatory Visit (INDEPENDENT_AMBULATORY_CARE_PROVIDER_SITE_OTHER): Payer: BLUE CROSS/BLUE SHIELD | Admitting: Endocrinology

## 2016-01-03 VITALS — BP 118/62 | HR 78 | Temp 97.8°F | Ht 69.0 in | Wt 293.0 lb

## 2016-01-03 DIAGNOSIS — E119 Type 2 diabetes mellitus without complications: Secondary | ICD-10-CM | POA: Diagnosis not present

## 2016-01-03 LAB — POCT GLYCOSYLATED HEMOGLOBIN (HGB A1C): HEMOGLOBIN A1C: 6.8

## 2016-01-03 NOTE — Progress Notes (Signed)
Subjective:    Patient ID: Larry Rose, male    DOB: February 22, 1966, 50 y.o.   MRN: [id number]  HPI Pt returns for f/u of diabetes mellitus:  DM type: Insulin-requiring type 2 Dx'ed: 1993 Complications: none Therapy: insulin since 2009.  DKA: never Severe hypoglycemia: never.  Pancreatitis: never.  Other: he takes multiple daily injections, and works rotating shifts; he has declined weight-loss surgery; he also declines U-500.   Interval history: he works 3rd shift; he seldom checks cbg's.  He takes the insulin as rx'ed.  He denies hypoglycemia.  Past Medical History  Diagnosis Date  . TINEA CRURIS 11/07/2008  . DIABETES MELLITUS, TYPE II 04/01/2007  . HYPERLIPIDEMIA, ATHEROGENIC 09/29/2007  . ALCOHOLISM 11/30/2009  . CARPAL TUNNEL SYNDROME, RIGHT 10/06/2008  . HYPERTENSION 04/01/2007  . ALLERGIC RHINITIS 04/01/2007  . Rosacea 05/14/2010  . Hepatic steatosis     Past Surgical History  Procedure Laterality Date  . Carpel tunnel    . Fracture surgery      Social History   Social History  . Marital Status: Married    Spouse Name: N/A  . Number of Children: N/A  . Years of Education: N/A   Occupational History  . Works Quarry manager    Social History Main Topics  . Smoking status: Never Smoker   . Smokeless tobacco: Not on file  . Alcohol Use: Not on file  . Drug Use: Not on file  . Sexual Activity: Not on file   Other Topics Concern  . Not on file   Social History Narrative    Current Outpatient Prescriptions on File Prior to Visit  Medication Sig Dispense Refill  . BD PEN NEEDLE NANO U/F 32G X 4 MM MISC USE 3 TIMES PER DAY. 300 each 2  . glucose blood (ONE TOUCH ULTRA TEST) test strip 1 each by Other route 2 (two) times daily. And lancets 2/day 250.01 100 each 12  . insulin aspart (NOVOLOG FLEXPEN) 100 UNIT/ML FlexPen Inject 190 Units into the skin 3 (three) times daily with meals. And pen needles 3/day    . lisinopril-hydrochlorothiazide  (PRINZIDE,ZESTORETIC) 20-12.5 MG tablet TAKE 1 TABLET BY MOUTH EVERY DAY 30 tablet 11  . Multiple Vitamin (MULTIVITAMIN) tablet Take 1 tablet by mouth daily.      Marland Kitchen NOVOLOG FLEXPEN 100 UNIT/ML FlexPen INJECT 180 UNITS INTO THE SKIN 3 (THREE) TIMES DAILY WITH MEALS 165 pen 3  . triamcinolone cream (KENALOG) 0.1 % Apply 1 application topically 3 (three) times daily. As needed for itching 45 g 2  . allopurinol (ZYLOPRIM) 100 MG tablet Take 1 tablet (100 mg total) by mouth daily. (Patient not taking: Reported on 02/24/2015) 30 tablet 6  . fenofibrate 54 MG tablet Take 1 tablet (54 mg total) by mouth daily. (Patient not taking: Reported on 05/29/2015) 30 tablet 11   No current facility-administered medications on file prior to visit.    Allergies  Allergen Reactions  . Enalapril Maleate   . Lovastatin     Family History  Problem Relation Age of Onset  . Cancer Neg Hx     BP 118/62 mmHg  Pulse 78  Temp(Src) 97.8 F (36.6 C) (Oral)  Ht  (1.753 m)  Wt 293 lb (132.904 kg)  BMI 43.25 kg/m2  SpO2 95%  Review of Systems He denies hypoglycemia    Objective:   Physical Exam VITAL SIGNS:  See vs page GENERAL: no distress.  Morbid obesity Pulses: dorsalis pedis intact bilat.  MSK: no  deformity of the feet CV: trace bilat leg edema.  Skin: no ulcer on the feet. normal color and temp on the feet. Neuro: sensation is intact to touch on the feet.    A1c=6.8%    Assessment & Plan:  DM: well-controlled.   Patient is advised the following: Patient Instructions  check your blood sugar twice a day.  vary the time of day when you check, between before the 3 meals, and at bedtime.  also check if you have symptoms of your blood sugar being too high or too low.  please keep a record of the readings and bring it to your next appointment here.  please call us sooner if your blood sugar goes below 70, or if you have a lot of readings over 200.   Please continue the regular insulin: 190  units 3 times a day (just before each meal), but half as much when you are at work.   Please come back for a regular physical appointment in 3 months.

## 2016-01-03 NOTE — Patient Instructions (Addendum)
check your blood sugar twice a day.  vary the time of day when you check, between before the 3 meals, and at bedtime.  also check if you have symptoms of your blood sugar being too high or too low.  please keep a record of the readings and bring it to your next appointment here.  please call us sooner if your blood sugar goes below 70, or if you have a lot of readings over 200.   Please continue the regular insulin: 190 units 3 times a day (just before each meal), but half as much when you are at work.   Please come back for a regular physical appointment in 3 months.

## 2016-01-14 ENCOUNTER — Other Ambulatory Visit: Payer: Self-pay | Admitting: Endocrinology

## 2016-03-16 IMAGING — CR DG HAND COMPLETE 3+V*R*
3 series · 3 of 3 positions shown · non-contrast
Comparison: 12/17/2010.

CLINICAL DATA: Crush injury 4 months ago, right hand pain in the
distal index finger, tenderness and bruising at the nail bed,
initial encounter.

EXAM:
RIGHT HAND - COMPLETE 3+ VIEW

[x hand pa right]
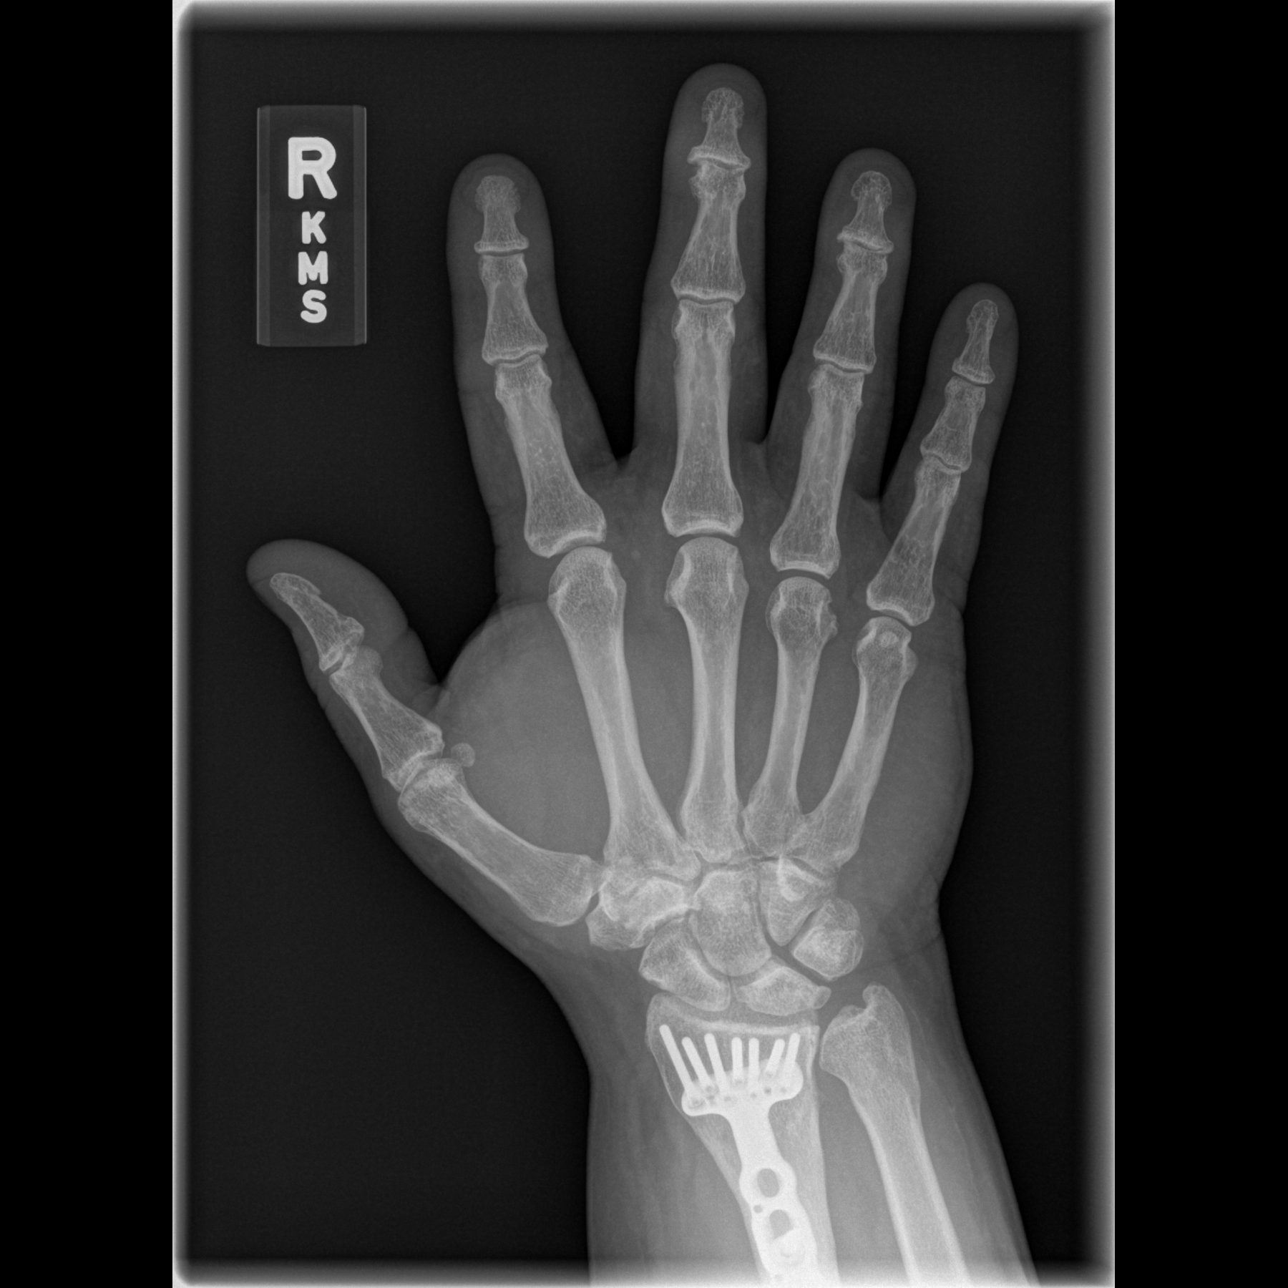

[x hand oblique right]
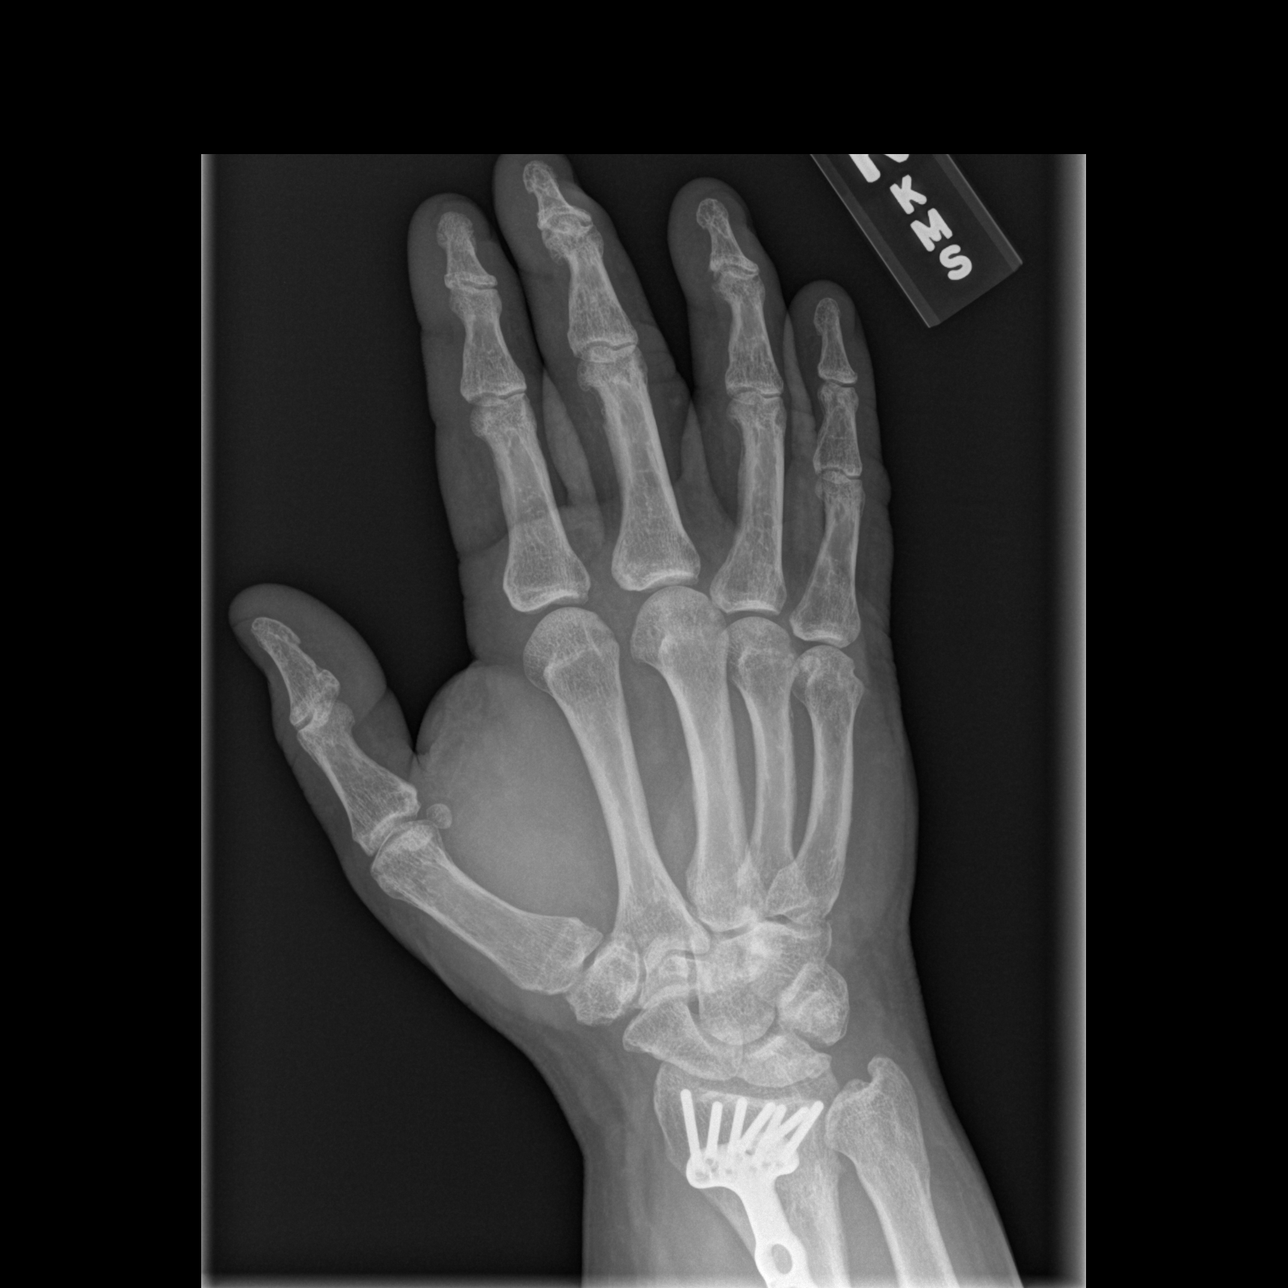

[x hand lat right]
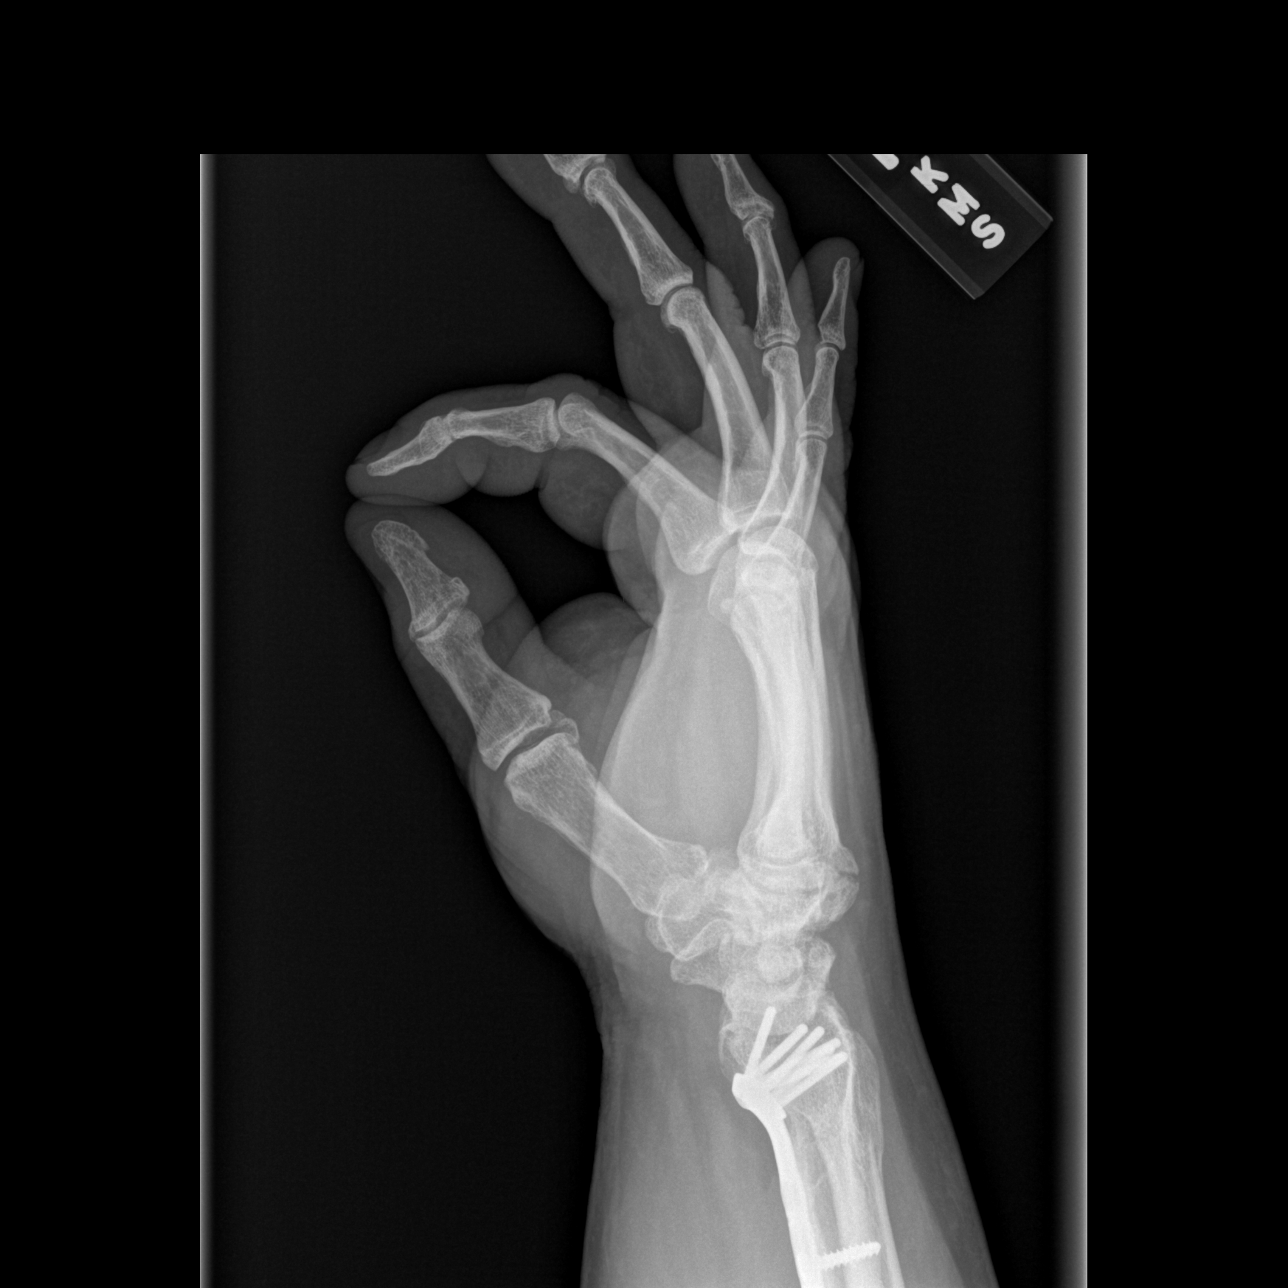

[3 of 3 positions shown; findings below may reference images not displayed]

FINDINGS: No acute osseous or joint abnormality. Mild degenerative changes in
the distal interphalangeal joints. Plate and screw fixation of the
distal radius is seen. Degenerative change at scaphoid trapezium
trapezoid joint.
IMPRESSION: 1. No acute findings to explain the patient's pain.
2. Degenerative changes in the STT and distal interphalangeal
joints.

## 2016-03-16 IMAGING — CR DG ANKLE COMPLETE 3+V*R*
3 series · 3 of 3 positions shown · non-contrast
Comparison: None.

CLINICAL DATA: Medial right ankle pain for several weeks. No known
injury. Swelling and medial ankle.

EXAM:
RIGHT ANKLE - COMPLETE 3+ VIEW

[t ankle joint ap right]
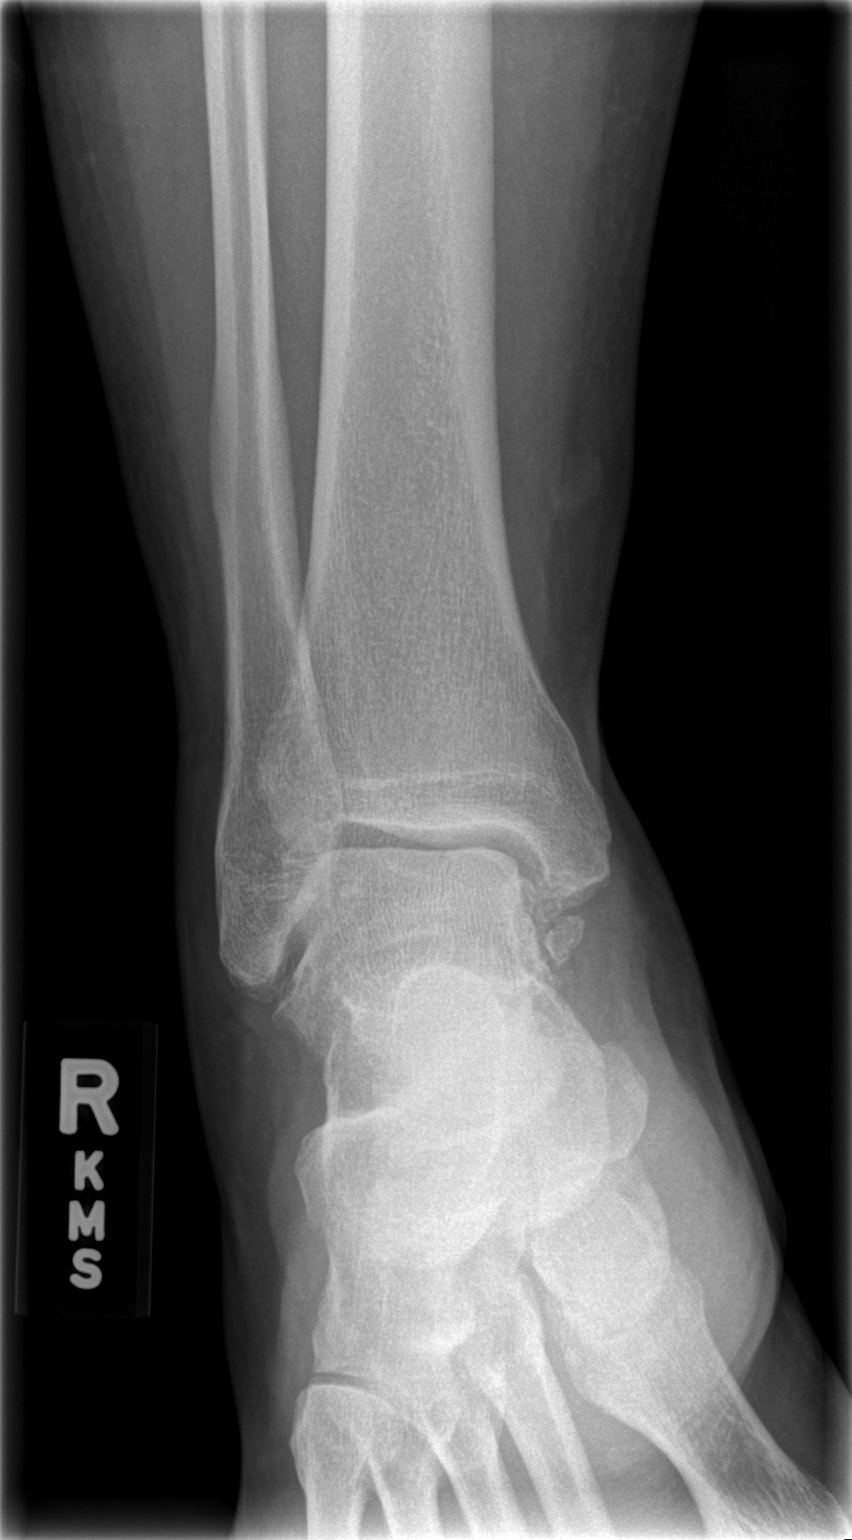

[t ankle joint oblique right]
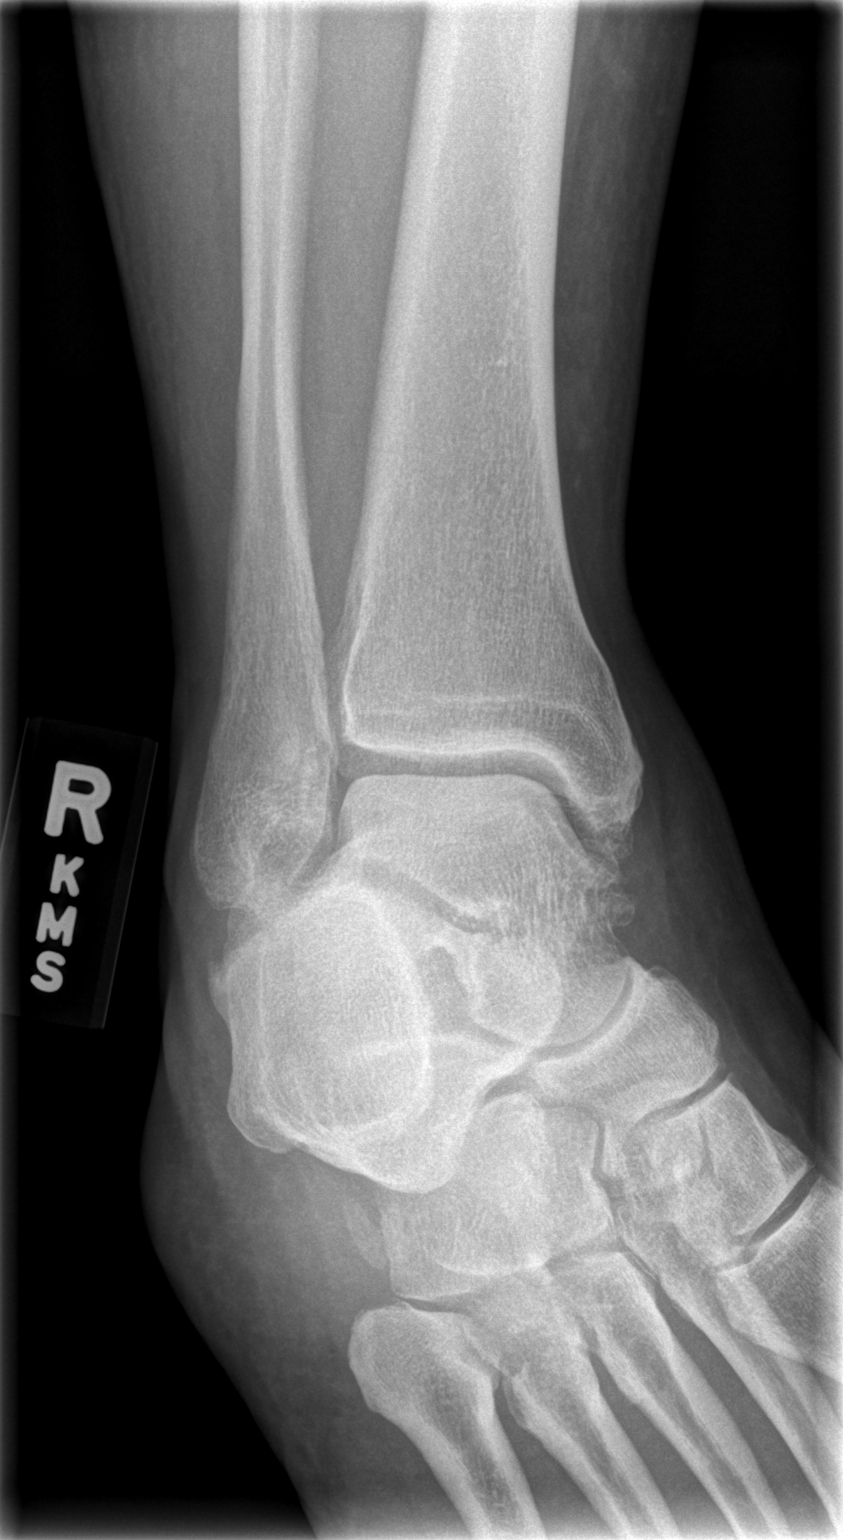

[t ankle joint lat right]
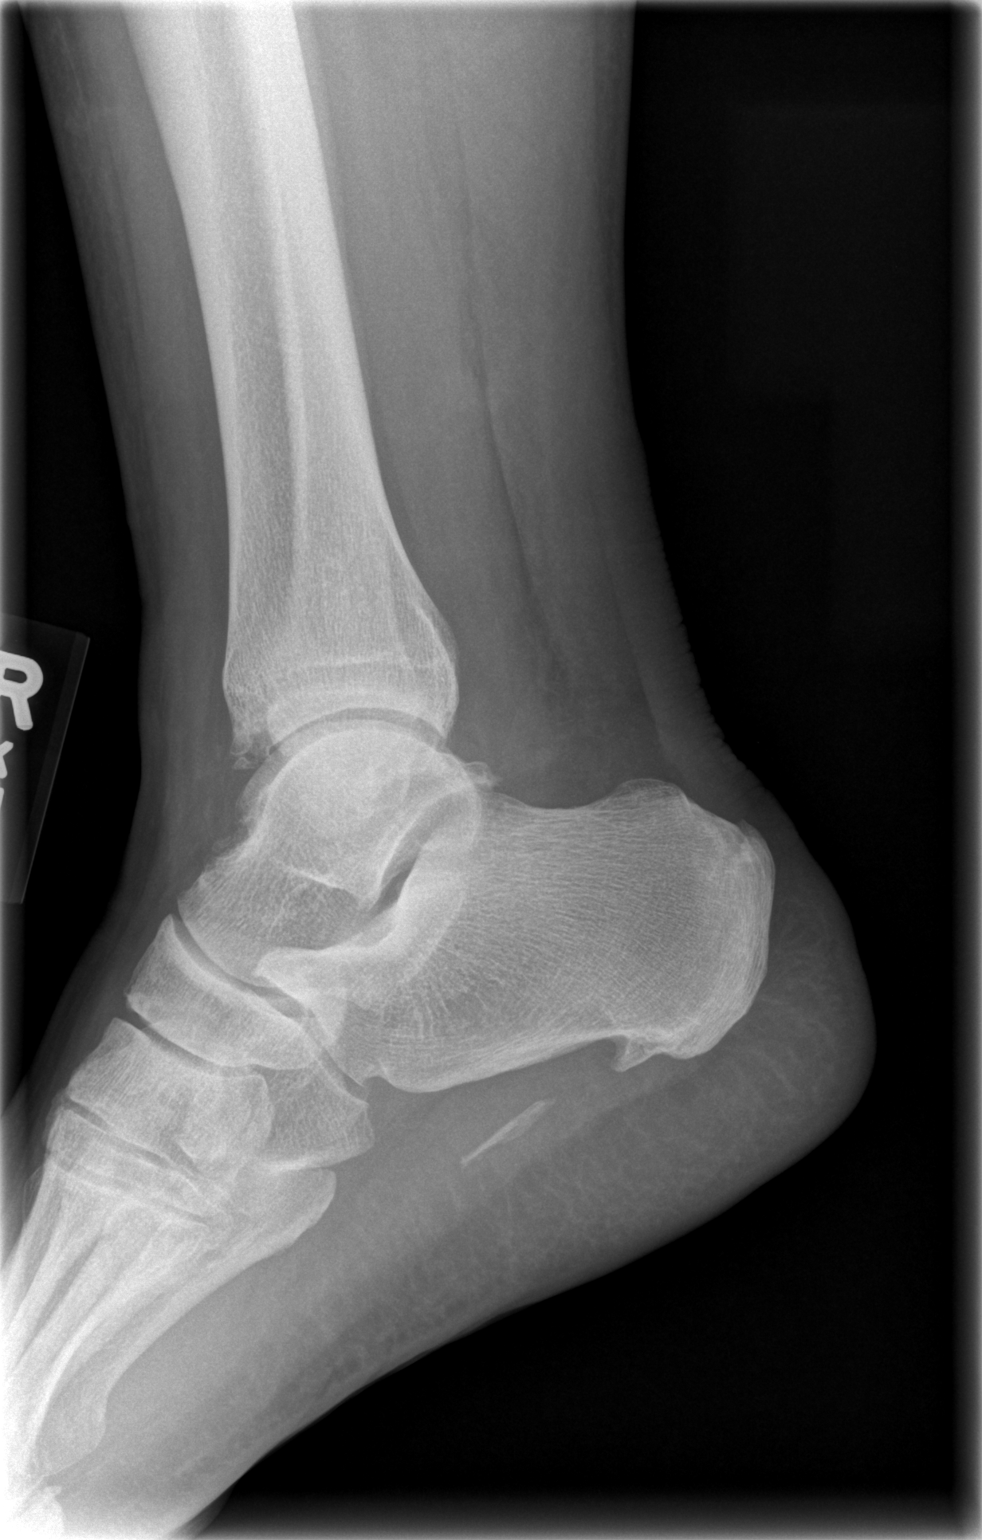

[3 of 3 positions shown; findings below may reference images not displayed]

FINDINGS: Well corticated bone density noted along the tip of the medial
malleolus, possibly related to old injury. No acute fracture. Joint
space is maintained. Mild spurring in the ankle joint.

Calcifications inferior to the calcaneus, possibly within the
plantar fascia from old injury. Plantar calcaneal spur noted.
IMPRESSION: No acute bony abnormality. Probable old healed medial malleolar
avulsion fracture. Mild spurring in the ankle.

## 2016-04-04 ENCOUNTER — Ambulatory Visit: Payer: BLUE CROSS/BLUE SHIELD | Admitting: Endocrinology

## 2016-04-24 ENCOUNTER — Encounter: Payer: Self-pay | Admitting: Endocrinology

## 2016-04-24 ENCOUNTER — Ambulatory Visit (INDEPENDENT_AMBULATORY_CARE_PROVIDER_SITE_OTHER): Payer: BLUE CROSS/BLUE SHIELD | Admitting: Endocrinology

## 2016-04-24 VITALS — BP 122/70 | HR 66 | Ht 69.0 in | Wt 292.0 lb

## 2016-04-24 DIAGNOSIS — Z0189 Encounter for other specified special examinations: Secondary | ICD-10-CM | POA: Diagnosis not present

## 2016-04-24 DIAGNOSIS — Z23 Encounter for immunization: Secondary | ICD-10-CM | POA: Diagnosis not present

## 2016-04-24 DIAGNOSIS — Z0000 Encounter for general adult medical examination without abnormal findings: Secondary | ICD-10-CM | POA: Insufficient documentation

## 2016-04-24 DIAGNOSIS — R0600 Dyspnea, unspecified: Secondary | ICD-10-CM | POA: Insufficient documentation

## 2016-04-24 DIAGNOSIS — E119 Type 2 diabetes mellitus without complications: Secondary | ICD-10-CM

## 2016-04-24 LAB — CBC WITH DIFFERENTIAL/PLATELET
BASOS PCT: 0.3 % (ref 0.0–3.0)
Basophils Absolute: 0 10*3/uL (ref 0.0–0.1)
EOS PCT: 3.1 % (ref 0.0–5.0)
Eosinophils Absolute: 0.2 10*3/uL (ref 0.0–0.7)
HCT: 40.5 % (ref 39.0–52.0)
Hemoglobin: 14.1 g/dL (ref 13.0–17.0)
LYMPHS ABS: 2.1 10*3/uL (ref 0.7–4.0)
Lymphocytes Relative: 30.4 % (ref 12.0–46.0)
MCHC: 34.8 g/dL (ref 30.0–36.0)
MCV: 93.4 fl (ref 78.0–100.0)
MONO ABS: 0.5 10*3/uL (ref 0.1–1.0)
Monocytes Relative: 7.1 % (ref 3.0–12.0)
NEUTROS PCT: 59.1 % (ref 43.0–77.0)
Neutro Abs: 4.1 10*3/uL (ref 1.4–7.7)
Platelets: 190 10*3/uL (ref 150.0–400.0)
RBC: 4.34 Mil/uL (ref 4.22–5.81)
RDW: 13.7 % (ref 11.5–15.5)
WBC: 7 10*3/uL (ref 4.0–10.5)

## 2016-04-24 LAB — HEPATIC FUNCTION PANEL
ALK PHOS: 51 U/L (ref 39–117)
ALT: 43 U/L (ref 0–53)
AST: 40 U/L — ABNORMAL HIGH (ref 0–37)
Albumin: 4 g/dL (ref 3.5–5.2)
Bilirubin, Direct: 0.1 mg/dL (ref 0.0–0.3)
TOTAL PROTEIN: 6.9 g/dL (ref 6.0–8.3)
Total Bilirubin: 0.5 mg/dL (ref 0.2–1.2)

## 2016-04-24 LAB — BASIC METABOLIC PANEL
BUN: 18 mg/dL (ref 6–23)
CALCIUM: 9.4 mg/dL (ref 8.4–10.5)
CO2: 30 mEq/L (ref 19–32)
CREATININE: 0.93 mg/dL (ref 0.40–1.50)
Chloride: 99 mEq/L (ref 96–112)
GFR: 92.29 mL/min (ref >60.00)
Glucose, Bld: 213 mg/dL — ABNORMAL HIGH (ref 70–99)
Potassium: 4.3 mEq/L (ref 3.5–5.1)
Sodium: 136 mEq/L (ref 135–145)

## 2016-04-24 LAB — MICROALBUMIN / CREATININE URINE RATIO
CREATININE, U: 132.1 mg/dL
MICROALB UR: 0.7 mg/dL (ref 0.0–1.9)
Microalb Creat Ratio: 0.5 mg/g (ref 0.0–30.0)

## 2016-04-24 LAB — LIPID PANEL
Cholesterol: 274 mg/dL — ABNORMAL HIGH (ref 0–200)
HDL: 32.7 mg/dL — AB (ref >39.00)
Total CHOL/HDL Ratio: 8
Triglycerides: 578 mg/dL — ABNORMAL HIGH (ref 0.0–149.0)

## 2016-04-24 LAB — POCT GLYCOSYLATED HEMOGLOBIN (HGB A1C): Hemoglobin A1C: 7.6

## 2016-04-24 LAB — LDL CHOLESTEROL, DIRECT: Direct LDL: 100 mg/dL

## 2016-04-24 LAB — TSH: TSH: 3.63 u[IU]/mL (ref 0.35–4.50)

## 2016-04-24 MED ORDER — INSULIN ASPART 100 UNIT/ML FLEXPEN: 190.0000 [IU] | PEN_INJECTOR | Freq: Three times a day (TID) | SUBCUTANEOUS | 11 refills | Status: DC

## 2016-04-24 NOTE — Progress Notes (Signed)
Subjective:    Patient ID: Larry Rose, male    DOB: November 19, 1965, 50 y.o.   MRN: [id number]  HPI Pt is here for regular wellness examination, and is feeling pretty well in general, and says chronic med probs are stable, except as noted below.  Past Medical History:  Diagnosis Date  . ALCOHOLISM 11/30/2009  . ALLERGIC RHINITIS 04/01/2007  . CARPAL TUNNEL SYNDROME, RIGHT 10/06/2008  . DIABETES MELLITUS, TYPE II 04/01/2007  . Hepatic steatosis   . HYPERLIPIDEMIA, ATHEROGENIC 09/29/2007  . HYPERTENSION 04/01/2007  . Rosacea 05/14/2010  . TINEA CRURIS 11/07/2008    Past Surgical History:  Procedure Laterality Date  . carpel tunnel    . FRACTURE SURGERY      Social History   Social History  . Marital status: Married    Spouse name: N/A  . Number of children: N/A  . Years of education: N/A   Occupational History  . Works Quarry manager    Social History Main Topics  . Smoking status: Never Smoker  . Smokeless tobacco: Not on file  . Alcohol use Not on file  . Drug use: Unknown  . Sexual activity: Not on file   Other Topics Concern  . Not on file   Social History Narrative  . No narrative on file    Current Outpatient Prescriptions on File Prior to Visit  Medication Sig Dispense Refill  . allopurinol (ZYLOPRIM) 100 MG tablet Take 1 tablet (100 mg total) by mouth daily. 30 tablet 6  . BD PEN NEEDLE NANO U/F 32G X 4 MM MISC USE 3 TIMES PER DAY. 300 each 2  . glucose blood (ONE TOUCH ULTRA TEST) test strip 1 each by Other route 2 (two) times daily. And lancets 2/day 250.01 100 each 12  . lisinopril-hydrochlorothiazide (PRINZIDE,ZESTORETIC) 20-12.5 MG tablet TAKE 1 TABLET BY MOUTH EVERY DAY 30 tablet 11  . Multiple Vitamin (MULTIVITAMIN) tablet Take 1 tablet by mouth daily.      Marland Kitchen triamcinolone cream (KENALOG) 0.1 % Apply 1 application topically 3 (three) times daily. As needed for itching 45 g 2  . fenofibrate 54 MG tablet Take 1 tablet (54 mg total) by mouth  daily. (Patient not taking: Reported on 05/29/2015) 30 tablet 11   No current facility-administered medications on file prior to visit.     Allergies  Allergen Reactions  . Enalapril Maleate   . Lovastatin     Family History  Problem Relation Age of Onset  . Cancer Neg Hx     BP 122/70   Pulse 66   Ht 5\' 9"  (1.753 m)   Wt 292 lb (132.5 kg)   BMI 43.12 kg/m    Review of Systems  Constitutional: Negative for unexpected weight change.  HENT: Negative for hearing loss.   Eyes: Negative for visual disturbance.  Respiratory: Negative for cough.   Cardiovascular: Negative for chest pain.  Gastrointestinal: Negative for anal bleeding.  Endocrine: Negative for cold intolerance.  Genitourinary: Negative for hematuria.  Musculoskeletal: Negative for back pain.  Skin: Negative for rash.  Allergic/Immunologic: Positive for environmental allergies.  Neurological: Negative for numbness.  Hematological: Does not bruise/bleed easily.  Psychiatric/Behavioral: Negative for dysphoric mood.       Objective:   Physical Exam VS: see vs page GEN: no distress.  Morbid obesity HEAD: head: no deformity eyes: no periorbital swelling, no proptosis external nose and ears are normal mouth: no lesion seen NECK: supple, thyroid is not enlarged CHEST WALL: no deformity LUNGS:  clear to auscultation BREASTS:  bilat pseudogynecomastia CV: reg rate and rhythm, no murmur ABD: abdomen is soft, nontender.  no hepatosplenomegaly.  not distended.  no hernia.  MUSCULOSKELETAL: muscle bulk and strength are grossly normal.  no obvious joint swelling.  gait is normal and steady PULSES: no carotid bruit NEURO:  cn 2-12 grossly intact.   readily moves all 4's.   SKIN:  Normal texture and temperature.  No rash or suspicious lesion is visible.   NODES:  None palpable at the neck PSYCH: alert, well-oriented.  Does not appear anxious nor depressed.   i personally reviewed electrocardiogram tracing  (today): Indication: DM Impression: left axis.     Assessment & Plan:  Wellness visit today, with problems stable, except as noted.   SEPARATE EVALUATION FOLLOWS--EACH PROBLEM HERE IS NEW, NOT RESPONDING TO TREATMENT, OR POSES SIGNIFICANT RISK TO THE PATIENT'S HEALTH: HISTORY OF THE PRESENT ILLNESS: Pt returns for f/u of diabetes mellitus:  DM type: Insulin-requiring type 2 Dx'ed: 1993 Complications: none Therapy: insulin since 2009.  DKA: never Severe hypoglycemia: never.  Pancreatitis: never.  Other: he takes multiple daily injections, and works rotating shifts; he has declined weight-loss surgery; he also declines U-500.   Interval history: He sometimes takes just 130 units per dose at night, due to mild hypoglycemia with 180 units.  no cbg record, but states cbg is sometimes in the 200's. PAST MEDICAL HISTORY reviewed and up to date today REVIEW OF SYSTEMS: He has doe PHYSICAL EXAMINATION: VITAL SIGNS:  See vs page GENERAL: no distress Pulses: dorsalis pedis intact bilat.   MSK: no deformity of the feet CV: 1+ bilat leg edema Skin:  no ulcer on the feet.  normal color and temp on the feet.  Neuro: sensation is intact to touch on the feet.   Ext: There is bilateral onychomycosis of the toenails.  LAB/XRAY RESULTS: Lab Results  Component Value Date   HGBA1C 7.6 04/24/2016  IMPRESSION: DM: he needs increased rx Dyspnea, new

## 2016-04-24 NOTE — Patient Instructions (Addendum)
check your blood sugar twice a day.  vary the time of day when you check, between before the 3 meals, and at bedtime.  also check if you have symptoms of your blood sugar being too high or too low.  please keep a record of the readings and bring it to your next appointment here.  please call us sooner if your blood sugar goes below 70, or if you have a lot of readings over 200.   Please increase the novolog to 190 units 3 times a day (just before each meal), but just 130 units when you are at work. Let's check a treadmill test.  you will receive a phone call, about a day and time for an appointment.   blood tests are requested for you today.  We'll let you know about the results.  Please come back for a follow-up appointment in January.   Please consider these measures for your health:  minimize alcohol.  Do not use tobacco products.  Keep firearms safely stored.  Always use seat belts.  have working smoke alarms in your home.  See an eye doctor and dentist regularly.  Never drive under the influence of alcohol or drugs (including prescription drugs).  Those with fair skin should take precautions against the sun, and should carefully examine their skin once per month, for any new or changed moles.

## 2016-04-25 LAB — HIV ANTIBODY (ROUTINE TESTING W REFLEX): HIV 1&2 Ab, 4th Generation: NONREACTIVE

## 2016-06-05 ENCOUNTER — Telehealth: Payer: Self-pay | Admitting: Endocrinology

## 2016-06-05 NOTE — Telephone Encounter (Signed)
Pt called and said he hasn't heard anything about scheduling his stress test appointment, he is just wondering when this will be done.

## 2016-06-06 NOTE — Telephone Encounter (Signed)
Please refer this message to [facility] 

## 2016-06-12 ENCOUNTER — Encounter (HOSPITAL_COMMUNITY): Payer: BLUE CROSS/BLUE SHIELD

## 2016-06-18 ENCOUNTER — Telehealth (HOSPITAL_COMMUNITY): Payer: Self-pay

## 2016-06-18 NOTE — Telephone Encounter (Signed)
Encounter complete. 

## 2016-06-20 ENCOUNTER — Ambulatory Visit (HOSPITAL_COMMUNITY)
Admission: RE | Admit: 2016-06-20 | Discharge: 2016-06-20 | Disposition: A | Discharge status: Home / Self Care | Payer: BLUE CROSS/BLUE SHIELD | Source: Ambulatory Visit | Attending: Cardiology | Admitting: Cardiology

## 2016-06-20 DIAGNOSIS — R0600 Dyspnea, unspecified: Secondary | ICD-10-CM

## 2016-06-20 LAB — EXERCISE TOLERANCE TEST
CSEPED: 6 min
CSEPEW: 7 METS
CSEPPHR: 150 {beats}/min
Exercise duration (sec): 0 s
MPHR: 173 {beats}/min
Percent HR: 86 %
RPE: 18
Rest HR: 76 {beats}/min

## 2016-07-06 ENCOUNTER — Other Ambulatory Visit: Payer: Self-pay | Admitting: Endocrinology

## 2016-07-21 ENCOUNTER — Other Ambulatory Visit: Payer: Self-pay | Admitting: Endocrinology

## 2016-08-14 ENCOUNTER — Other Ambulatory Visit: Payer: Self-pay | Admitting: Endocrinology

## 2016-08-23 ENCOUNTER — Ambulatory Visit (INDEPENDENT_AMBULATORY_CARE_PROVIDER_SITE_OTHER): Payer: BLUE CROSS/BLUE SHIELD | Admitting: Endocrinology

## 2016-08-23 ENCOUNTER — Encounter: Payer: Self-pay | Admitting: Endocrinology

## 2016-08-23 VITALS — BP 116/70 | HR 84 | Ht 66.0 in | Wt 299.0 lb

## 2016-08-23 DIAGNOSIS — E119 Type 2 diabetes mellitus without complications: Secondary | ICD-10-CM

## 2016-08-23 LAB — POCT GLYCOSYLATED HEMOGLOBIN (HGB A1C): HEMOGLOBIN A1C: 7.4

## 2016-08-23 LAB — GLUCOSE, POCT (MANUAL RESULT ENTRY): POC Glucose: 161 mg/dl — AB (ref 70–99)

## 2016-08-23 MED ORDER — TRIAMCINOLONE ACETONIDE 0.5 % EX OINT: 1.0000 "application " | TOPICAL_OINTMENT | Freq: Three times a day (TID) | CUTANEOUS | 2 refills | Status: DC

## 2016-08-23 MED ORDER — INSULIN ASPART 100 UNIT/ML FLEXPEN: 190.0000 [IU] | PEN_INJECTOR | Freq: Three times a day (TID) | SUBCUTANEOUS | 11 refills | Status: DC

## 2016-08-23 NOTE — Progress Notes (Signed)
Subjective:    Patient ID: Larry Rose, male    DOB: 11/30/1965, 51 y.o.   MRN: [id number]  HPI Pt returns for f/u of diabetes mellitus:  DM type: Insulin-requiring type 2 Dx'ed: 1993 Complications: none Therapy: insulin since 2009.  DKA: never Severe hypoglycemia: never.  Pancreatitis: never.  Other: he takes multiple daily injections; he works rotating shifts; he declines weight-loss surgery; he also declines U-500.   Interval history: He takes 180 units qac at any time of day, if he works or not.  no cbg record, but states cbg's are highest when he gets home from work on 3rd shift.  pt states he feels well in general.   Pt states few mos of skin nodule at the left forearm, but no assoc bleeding.  Past Medical History:  Diagnosis Date  . ALCOHOLISM 11/30/2009  . ALLERGIC RHINITIS 04/01/2007  . CARPAL TUNNEL SYNDROME, RIGHT 10/06/2008  . DIABETES MELLITUS, TYPE II 04/01/2007  . Hepatic steatosis   . HYPERLIPIDEMIA, ATHEROGENIC 09/29/2007  . HYPERTENSION 04/01/2007  . Rosacea 05/14/2010  . TINEA CRURIS 11/07/2008    Past Surgical History:  Procedure Laterality Date  . carpel tunnel    . FRACTURE SURGERY      Social History   Social History  . Marital status: Married    Spouse name: N/A  . Number of children: N/A  . Years of education: N/A   Occupational History  . Works [employer]arry [employer]    Social History Main Topics  . Smoking status: Never Smoker  . Smokeless tobacco: Not on file  . Alcohol use Not on file  . Drug use: Unknown  . Sexual activity: Not on file   Other Topics Concern  . Not on file   Social History Narrative  . No narrative on file    Current Outpatient Prescriptions on File Prior to Visit  Medication Sig Dispense Refill  . allopurinol (ZYLOPRIM) 100 MG tablet Take 1 tablet (100 mg total) by mouth daily. 30 tablet 6  . BD PEN NEEDLE NANO U/F 32G X 4 MM MISC USE 3 TIMES PER DAY. 100 each 2  . glucose blood (ONE TOUCH ULTRA TEST)  test strip 1 each by Other route 2 (two) times daily. And lancets 2/day 250.01 100 each 12  . lisinopril-hydrochlorothiazide (PRINZIDE,ZESTORETIC) 20-12.5 MG tablet TAKE 1 TABLET BY MOUTH EVERY DAY 30 tablet 11  . Multiple Vitamin (MULTIVITAMIN) tablet Take 1 tablet by mouth daily.      . fenofibrate 54 MG tablet Take 1 tablet (54 mg total) by mouth daily. (Patient not taking: Reported on 08/23/2016) 30 tablet 11   No current facility-administered medications on file prior to visit.     Allergies  Allergen Reactions  . Enalapril Maleate   . Lovastatin     Family History  Problem Relation Age of Onset  . Cancer Neg Hx     BP 116/70   Pulse 84   Ht 5\' 6"  (1.676 m)   Wt 299 lb (135.6 kg)   SpO2 97%   BMI 48.26 kg/m    Review of Systems He denies hypoglycemia and itching.      Objective:   Physical Exam  VITAL SIGNS:  See vs page GENERAL: no distress Pulses: dorsalis pedis intact bilat.   MSK: no deformity of the feet CV: 1+ bilat leg edema Skin:  no ulcer on the feet.  normal color and temp on the feet. Mild eczema of the left forearm. Neuro: sensation is  intact to touch on the feet.   Ext: There is bilateral onychomycosis of the toenails.   A1c=7.4% Cbg=161.     Assessment & Plan:  Insulin-requiring type 2 DM: he needs increased rx.  Eczema is the most likely cause for the skin "nodule."  Patient is advised the following: Patient Instructions  check your blood sugar twice a day.  vary the time of day when you check, between before the 3 meals, and at bedtime.  also check if you have symptoms of your blood sugar being too high or too low.  please keep a record of the readings and bring it to your next appointment here.  please call us sooner if your blood sugar goes below 70, or if you have a lot of readings over 200.   Please increase the novolog to 190 units 3 times a day (just before each meal), even if you are at work.  I have sent a prescription to your pharmacy,  for the skin nodule.   Please come back for a follow-up appointment in 3-4 months

## 2016-08-23 NOTE — Patient Instructions (Addendum)
check your blood sugar twice a day.  vary the time of day when you check, between before the 3 meals, and at bedtime.  also check if you have symptoms of your blood sugar being too high or too low.  please keep a record of the readings and bring it to your next appointment here.  please call us sooner if your blood sugar goes below 70, or if you have a lot of readings over 200.   Please increase the novolog to 190 units 3 times a day (just before each meal), even if you are at work.  I have sent a prescription to your pharmacy, for the skin nodule.   Please come back for a follow-up appointment in 3-4 months

## 2016-11-23 ENCOUNTER — Other Ambulatory Visit: Payer: Self-pay | Admitting: Endocrinology

## 2016-12-26 ENCOUNTER — Encounter: Payer: Self-pay | Admitting: Endocrinology

## 2016-12-26 ENCOUNTER — Ambulatory Visit (INDEPENDENT_AMBULATORY_CARE_PROVIDER_SITE_OTHER): Payer: BLUE CROSS/BLUE SHIELD | Admitting: Endocrinology

## 2016-12-26 VITALS — BP 100/64 | HR 68 | Ht 66.0 in | Wt 293.0 lb

## 2016-12-26 DIAGNOSIS — E119 Type 2 diabetes mellitus without complications: Secondary | ICD-10-CM | POA: Diagnosis not present

## 2016-12-26 NOTE — Progress Notes (Signed)
Subjective:    Patient ID: Larry Rose, male    DOB: 23-Jan-1966, 51 y.o.   MRN: [id number]  HPI Pt returns for f/u of diabetes mellitus:  DM type: Insulin-requiring type 2 Dx'ed: 1993 Complications: none Therapy: insulin since 2009.  DKA: never.  Severe hypoglycemia: never.  Pancreatitis: never.  Other: he takes multiple daily injections; he works rotating shifts; he declines weight-loss surgery; he also declines U-500.   Interval history: He takes 180 units qac at any time of day, if he works or not.  no cbg record, but states cbg's are still highest when he gets home from work on 3rd shift.  pt states he feels well in general.   Past Medical History:  Diagnosis Date  . ALCOHOLISM 11/30/2009  . ALLERGIC RHINITIS 04/01/2007  . CARPAL TUNNEL SYNDROME, RIGHT 10/06/2008  . DIABETES MELLITUS, TYPE II 04/01/2007  . Hepatic steatosis   . HYPERLIPIDEMIA, ATHEROGENIC 09/29/2007  . HYPERTENSION 04/01/2007  . Rosacea 05/14/2010  . TINEA CRURIS 11/07/2008    Past Surgical History:  Procedure Laterality Date  . carpel tunnel    . FRACTURE SURGERY      Social History   Social History  . Marital status: Married    Spouse name: N/A  . Number of children: N/A  . Years of education: N/A   Occupational History  . Works Quarry [employer]    Social History Main Topics  . Smoking status: Never Smoker  . Smokeless tobacco: Never Used  . Alcohol use Not on file  . Drug use: Unknown  . Sexual activity: Not on file   Other Topics Concern  . Not on file   Social History Narrative  . No narrative on file    Current Outpatient Prescriptions on File Prior to Visit  Medication Sig Dispense Refill  . BD PEN NEEDLE NANO U/F 32G X 4 MM MISC USE 3 TIMES PER DAY. 100 each 11  . glucose blood (ONE TOUCH ULTRA TEST) test strip 1 each by Other route 2 (two) times daily. And lancets 2/day 250.01 100 each 12  . insulin aspart (NOVOLOG FLEXPEN) 100 UNIT/ML FlexPen Inject 190 Units into  the skin 3 (three) times daily with meals. And pen needles 3/day 195 mL 11  . lisinopril-hydrochlorothiazide (PRINZIDE,ZESTORETIC) 20-12.5 MG tablet TAKE 1 TABLET BY MOUTH EVERY DAY 30 tablet 11  . Multiple Vitamin (MULTIVITAMIN) tablet Take 1 tablet by mouth daily.      Marland Kitchen. triamcinolone ointment (KENALOG) 0.5 % Apply 1 application topically 3 (three) times daily. As needed for skin nodule 30 g 2  . allopurinol (ZYLOPRIM) 100 MG tablet Take 1 tablet (100 mg total) by mouth daily. (Patient not taking: Reported on 12/26/2016) 30 tablet 6  . fenofibrate 54 MG tablet Take 1 tablet (54 mg total) by mouth daily. (Patient not taking: Reported on 12/26/2016) 30 tablet 11   No current facility-administered medications on file prior to visit.     Allergies  Allergen Reactions  . Enalapril Maleate   . Lovastatin     Family History  Problem Relation Age of Onset  . Cancer Neg Hx     BP 100/64   Pulse 68   Ht 5\' 6"  (1.676 m)   Wt 293 lb (132.9 kg)   SpO2 96%   BMI 47.29 kg/m    Review of Systems He denies hypoglycemia.  He has lost a few lbs, due to his efforts.      Objective:   Physical Exam VITAL  SIGNS:  See vs page GENERAL: no distress Pulses: dorsalis pedis intact bilat.   MSK: no deformity of the feet CV: 1+ bilat leg edema Skin:  no ulcer on the feet.  normal color and temp on the feet.  Neuro: sensation is intact to touch on the feet.   Ext: There is bilateral onychomycosis of the toenails.   Lab Results  Component Value Date   HGBA1C 7.4 08/23/2016      Assessment & Plan:  Insulin-requiring type 2 DM: well-controlled.  Occupational status: with variable shifts, this is best regimen for him.  Obesity: improved: pt is encouraged to continue efforts.    Patient Instructions  check your blood sugar twice a day.  vary the time of day when you check, between before the 3 meals, and at bedtime.  also check if you have symptoms of your blood sugar being too high or too low.   please keep a record of the readings and bring it to your next appointment here.  please call us sooner if your blood sugar goes below 70, or if you have a lot of readings over 200.   Please continue the same insulin. Please come back for a follow-up appointment in 4 months.

## 2016-12-26 NOTE — Patient Instructions (Addendum)
check your blood sugar twice a day.  vary the time of day when you check, between before the 3 meals, and at bedtime.  also check if you have symptoms of your blood sugar being too high or too low.  please keep a record of the readings and bring it to your next appointment here.  please call us sooner if your blood sugar goes below 70, or if you have a lot of readings over 200.   Please continue the same insulin. Please come back for a follow-up appointment in 4 months.

## 2016-12-27 LAB — HM DIABETES EYE EXAM

## 2017-04-28 ENCOUNTER — Ambulatory Visit (INDEPENDENT_AMBULATORY_CARE_PROVIDER_SITE_OTHER): Payer: BLUE CROSS/BLUE SHIELD | Admitting: Endocrinology

## 2017-04-28 ENCOUNTER — Other Ambulatory Visit (INDEPENDENT_AMBULATORY_CARE_PROVIDER_SITE_OTHER): Payer: BLUE CROSS/BLUE SHIELD

## 2017-04-28 ENCOUNTER — Encounter: Payer: Self-pay | Admitting: Endocrinology

## 2017-04-28 VITALS — BP 104/77 | HR 68 | Ht 66.0 in | Wt 296.0 lb

## 2017-04-28 DIAGNOSIS — Z0000 Encounter for general adult medical examination without abnormal findings: Secondary | ICD-10-CM

## 2017-04-28 DIAGNOSIS — E119 Type 2 diabetes mellitus without complications: Secondary | ICD-10-CM | POA: Diagnosis not present

## 2017-04-28 DIAGNOSIS — R7989 Other specified abnormal findings of blood chemistry: Secondary | ICD-10-CM | POA: Diagnosis not present

## 2017-04-28 DIAGNOSIS — I10 Essential (primary) hypertension: Secondary | ICD-10-CM | POA: Diagnosis not present

## 2017-04-28 LAB — HEPATIC FUNCTION PANEL
ALT: 25 U/L (ref 0–53)
AST: 20 U/L (ref 0–37)
Albumin: 3.8 g/dL (ref 3.5–5.2)
Alkaline Phosphatase: 56 U/L (ref 39–117)
BILIRUBIN TOTAL: 0.6 mg/dL (ref 0.2–1.2)
Bilirubin, Direct: 0.1 mg/dL (ref 0.0–0.3)
Total Protein: 6.1 g/dL (ref 6.0–8.3)

## 2017-04-28 LAB — PSA: PSA: 0.33 ng/mL (ref 0.10–4.00)

## 2017-04-28 LAB — URINALYSIS, ROUTINE W REFLEX MICROSCOPIC
Bilirubin Urine: NEGATIVE
Hgb urine dipstick: NEGATIVE
KETONES UR: NEGATIVE
Leukocytes, UA: NEGATIVE
Nitrite: NEGATIVE
RBC / HPF: NONE SEEN (ref 0–?)
SPECIFIC GRAVITY, URINE: 1.02 (ref 1.000–1.030)
Total Protein, Urine: NEGATIVE
Urine Glucose: 500 — AB
Urobilinogen, UA: 0.2 (ref 0.0–1.0)
WBC UA: NONE SEEN (ref 0–?)
pH: 6 (ref 5.0–8.0)

## 2017-04-28 LAB — BASIC METABOLIC PANEL
BUN: 18 mg/dL (ref 6–23)
CO2: 29 mEq/L (ref 19–32)
Calcium: 9 mg/dL (ref 8.4–10.5)
Chloride: 97 mEq/L (ref 96–112)
Creatinine, Ser: 0.98 mg/dL (ref 0.40–1.50)
GFR: 86.5 mL/min (ref >60.00)
Glucose, Bld: 307 mg/dL — ABNORMAL HIGH (ref 70–99)
Potassium: 4.7 mEq/L (ref 3.5–5.1)
Sodium: 135 mEq/L (ref 135–145)

## 2017-04-28 LAB — MICROALBUMIN / CREATININE URINE RATIO
CREATININE, U: 84.3 mg/dL
MICROALB UR: 1.7 mg/dL (ref 0.0–1.9)
Microalb Creat Ratio: 2 mg/g (ref 0.0–30.0)

## 2017-04-28 LAB — CBC WITH DIFFERENTIAL/PLATELET
BASOS PCT: 0.4 % (ref 0.0–3.0)
Basophils Absolute: 0 10*3/uL (ref 0.0–0.1)
EOS PCT: 5.4 % — AB (ref 0.0–5.0)
Eosinophils Absolute: 0.3 10*3/uL (ref 0.0–0.7)
HCT: 43.6 % (ref 39.0–52.0)
HEMOGLOBIN: 14.7 g/dL (ref 13.0–17.0)
Lymphocytes Relative: 31.5 % (ref 12.0–46.0)
Lymphs Abs: 1.7 10*3/uL (ref 0.7–4.0)
MCHC: 33.7 g/dL (ref 30.0–36.0)
MCV: 96.3 fl (ref 78.0–100.0)
Monocytes Absolute: 0.4 10*3/uL (ref 0.1–1.0)
Monocytes Relative: 6.8 % (ref 3.0–12.0)
Neutro Abs: 3 10*3/uL (ref 1.4–7.7)
Neutrophils Relative %: 55.9 % (ref 43.0–77.0)
Platelets: 176 10*3/uL (ref 150.0–400.0)
RBC: 4.53 Mil/uL (ref 4.22–5.81)
RDW: 13.4 % (ref 11.5–15.5)
WBC: 5.4 10*3/uL (ref 4.0–10.5)

## 2017-04-28 LAB — POCT GLYCOSYLATED HEMOGLOBIN (HGB A1C): Hemoglobin A1C: 8.1

## 2017-04-28 LAB — LIPID PANEL
CHOL/HDL RATIO: 12
CHOLESTEROL: 315 mg/dL — AB (ref 0–200)
HDL: 25.4 mg/dL — ABNORMAL LOW (ref >39.00)
Triglycerides: 1177 mg/dL — ABNORMAL HIGH (ref 0.0–149.0)

## 2017-04-28 LAB — LDL CHOLESTEROL, DIRECT: Direct LDL: 67 mg/dL

## 2017-04-28 LAB — TSH: TSH: 3.78 u[IU]/mL (ref 0.35–4.50)

## 2017-04-28 MED ORDER — GLUCOSE BLOOD VI STRP: 1.0000 | ORAL_STRIP | Freq: Two times a day (BID) | 12 refills | Status: DC

## 2017-04-28 NOTE — Progress Notes (Signed)
Subjective:    Patient ID: Larry Rose, male    DOB: [birth year], 51 y.o.   MRN: [id number]  HPI Pt is here for regular wellness examination, and is feeling pretty well in general, and says chronic med probs are stable, except as noted below Past Medical History:  Diagnosis Date  . ALCOHOLISM 11/30/2009  . ALLERGIC RHINITIS 04/01/2007  . CARPAL TUNNEL SYNDROME, RIGHT 10/06/2008  . DIABETES MELLITUS, TYPE II 04/01/2007  . Hepatic steatosis   . HYPERLIPIDEMIA, ATHEROGENIC 09/29/2007  . HYPERTENSION 04/01/2007  . Rosacea 05/14/2010  . TINEA CRURIS 11/07/2008    Past Surgical History:  Procedure Laterality Date  . carpel tunnel    . FRACTURE SURGERY      Social History   Social History  . Marital status: Married    Spouse name: N/A  . Number of children: N/A  . Years of education: N/A   Occupational History  . Works Quarry manager    Social History Main Topics  . Smoking status: Never Smoker  . Smokeless tobacco: Never Used  . Alcohol use Not on file  . Drug use: Unknown  . Sexual activity: Not on file   Other Topics Concern  . Not on file   Social History Narrative  . No narrative on file    Current Outpatient Prescriptions on File Prior to Visit  Medication Sig Dispense Refill  . BD PEN NEEDLE NANO U/F 32G X 4 MM MISC USE 3 TIMES PER DAY. 100 each 11  . insulin aspart (NOVOLOG FLEXPEN) 100 UNIT/ML FlexPen Inject 190 Units into the skin 3 (three) times daily with meals. And pen needles 3/day 195 mL 11  . lisinopril-hydrochlorothiazide (PRINZIDE,ZESTORETIC) 20-12.5 MG tablet TAKE 1 TABLET BY MOUTH EVERY DAY 30 tablet 11  . Multiple Vitamin (MULTIVITAMIN) tablet Take 1 tablet by mouth daily.      Marland Kitchen triamcinolone ointment (KENALOG) 0.5 % Apply 1 application topically 3 (three) times daily. As needed for skin nodule 30 g 2   No current facility-administered medications on file prior to visit.     Allergies  Allergen Reactions  . Enalapril Maleate     . Lovastatin     Family History  Problem Relation Age of Onset  . Cancer Neg Hx     BP 104/77   Pulse 68   Ht  (1.676 m)   Wt 296 lb (134.3 kg)   BMI 47.78 kg/m     Review of Systems Denies fever, fatigue, visual loss, hearing loss, chest pain, sob, back pain, depression, cold intolerance, BRBPR, hematuria, syncope, numbness, allergy sxs, easy bruising, and rash.     Objective:   Physical Exam VS: see vs page GEN: no distress HEAD: head: no deformity eyes: no periorbital swelling, no proptosis external nose and ears are normal mouth: no lesion seen NECK: supple, thyroid is not enlarged CHEST WALL: no deformity LUNGS: clear to auscultation BREASTS:  bilat pseudogynecomastia CV: reg rate and rhythm, no murmur ABD: abdomen is soft, nontender.  no hepatosplenomegaly.  not distended.  no hernia MUSCULOSKELETAL: muscle bulk and strength are grossly normal.  no obvious joint swelling.  gait is normal and steady EXTEMITIES: no deformity.  no ulcer on the feet.  feet are of normal color and temp.  1+ bilat leg edema PULSES: dorsalis pedis intact bilat.  no carotid bruit NEURO:  cn 2-12 grossly intact.   readily moves all 4's.  sensation is intact to touch on the feet SKIN:  Normal texture  and temperature.  No rash or suspicious lesion is visible.   NODES:  None palpable at the neck.  PSYCH: alert, well-oriented.  Does not appear anxious nor depressed.    I personally reviewed electrocardiogram tracing (today): Indication: DM Impression: NSR.  No MI.  No hypertrophy. Let axis deviation Compared to 2017: no significant change      Assessment & Plan:  Wellness visit today, with problems stable, except as noted.   SEPARATE EVALUATION FOLLOWS--EACH PROBLEM HERE IS NEW, NOT RESPONDING TO TREATMENT, OR POSES SIGNIFICANT RISK TO THE PATIENT'S HEALTH: HISTORY OF THE PRESENT ILLNESS: Pt returns for f/u of diabetes mellitus:  DM type: Insulin-requiring type 2 Dx'ed:  1993 Complications: none Therapy: insulin since 2009.  DKA: never.  Severe hypoglycemia: never.  Pancreatitis: never.  Other: he takes multiple daily injections; he works rotating shifts; he declines weight-loss surgery; he also declines U-500.   Interval history:  Pt says he sometimes misses the insulin.  He does not check cbg's.  PAST MEDICAL HISTORY Past Medical History:  Diagnosis Date  . ALCOHOLISM 11/30/2009  . ALLERGIC RHINITIS 04/01/2007  . CARPAL TUNNEL SYNDROME, RIGHT 10/06/2008  . DIABETES MELLITUS, TYPE II 04/01/2007  . Hepatic steatosis   . HYPERLIPIDEMIA, ATHEROGENIC 09/29/2007  . HYPERTENSION 04/01/2007  . Rosacea 05/14/2010  . TINEA CRURIS 11/07/2008    Past Surgical History:  Procedure Laterality Date  . carpel tunnel    . FRACTURE SURGERY      Social History   Social History  . Marital status: Married    Spouse name: N/A  . Number of children: N/A  . Years of education: N/A   Occupational History  . Works Quarry [employer]    Social History Main Topics  . Smoking status: Never Smoker  . Smokeless tobacco: Never Used  . Alcohol use Not on file  . Drug use: Unknown  . Sexual activity: Not on file   Other Topics Concern  . Not on file   Social History Narrative  . No narrative on file    Current Outpatient Prescriptions on File Prior to Visit  Medication Sig Dispense Refill  . BD PEN NEEDLE NANO U/F 32G X 4 MM MISC USE 3 TIMES PER DAY. 100 each 11  . insulin aspart (NOVOLOG FLEXPEN) 100 UNIT/ML FlexPen Inject 190 Units into the skin 3 (three) times daily with meals. And pen needles 3/day 195 mL 11  . lisinopril-hydrochlorothiazide (PRINZIDE,ZESTORETIC) 20-12.5 MG tablet TAKE 1 TABLET BY MOUTH EVERY DAY 30 tablet 11  . Multiple Vitamin (MULTIVITAMIN) tablet Take 1 tablet by mouth daily.      Marland Kitchen. triamcinolone ointment (KENALOG) 0.5 % Apply 1 application topically 3 (three) times daily. As needed for skin nodule 30 g 2   No current  facility-administered medications on file prior to visit.     Allergies  Allergen Reactions  . Enalapril Maleate   . Lovastatin     Family History  Problem Relation Age of Onset  . Cancer Neg Hx     BP 104/77   Pulse 68   Ht 5\' 6"  (1.676 m)   Wt 296 lb (134.3 kg)   BMI 47.78 kg/m   REVIEW OF SYSTEMS: No weight change PHYSICAL EXAMINATION: VITAL SIGNS:  See vs page GENERAL: no distress Pulses: foot pulses are intact bilaterally.   MSK: no deformity of the feet or ankles.  CV: trace bilat edema of the legs. Skin:  no ulcer on the feet or ankles.  normal color and temp  on the feet and ankles Neuro: sensation is intact to touch on the feet and ankles.   Ext: There is bilateral onychomycosis of the toenails LAB/XRAY RESULTS: A1c=8.1% Lab Results  Component Value Date   CHOL 315 (H) 04/28/2017   HDL 25.40 (L) 04/28/2017   LDLCALC NOT CALC 08/23/2014   LDLDIRECT 67.0 04/28/2017   TRIG (H) 04/28/2017    1177.0 Triglyceride is over 400; calculations on Lipids are invalid.   CHOLHDL 12 04/28/2017  IMPRESSION: Insulin-requiring type 2 DM: worse Noncompliance with cbg recording: I told pt we need this info in order to safely increase insulin. Hypertriglyceridemia (nonfasting): persistent: best rx of this is insulin PLAN:  Please continue the same insulin check your blood sugar twice a day.  vary the time of day when you check, between before the 3 meals, and at bedtime.  also check if you have symptoms of your blood sugar being too high or too low.  please keep a record of the readings and bring it to your next appointment here (or you can bring the meter itself).  You can write it on any piece of paper.  please call us sooner if your blood sugar goes below 70, or if you have a lot of readings over 200.

## 2017-04-28 NOTE — Patient Instructions (Addendum)
Please consider these measures for your health:  minimize alcohol.  Do not use tobacco products.  Have a colonoscopy at least every 10 years from age 850.  Women should have an annual mammogram from age 49.  Keep firearms safely stored.  Always use seat belts.  have working smoke alarms in your home.  See an eye doctor and dentist regularly.  Never drive under the influence of alcohol or drugs (including prescription drugs).  Those with fair skin should take precautions against the sun, and should carefully examine their skin once per month, for any new or changed moles. I have sent a prescription to your pharmacy, for strips.  Please come back for a follow-up appointment in 2 months.  blood tests are requested for you today.  We'll let you know about the results.

## 2017-06-10 ENCOUNTER — Encounter: Payer: Self-pay | Admitting: Sports Medicine

## 2017-06-10 ENCOUNTER — Ambulatory Visit (INDEPENDENT_AMBULATORY_CARE_PROVIDER_SITE_OTHER): Payer: BLUE CROSS/BLUE SHIELD

## 2017-06-10 ENCOUNTER — Other Ambulatory Visit: Payer: Self-pay | Admitting: Sports Medicine

## 2017-06-10 ENCOUNTER — Ambulatory Visit (INDEPENDENT_AMBULATORY_CARE_PROVIDER_SITE_OTHER): Payer: BLUE CROSS/BLUE SHIELD | Admitting: Sports Medicine

## 2017-06-10 VITALS — BP 124/69 | HR 89 | Resp 16

## 2017-06-10 DIAGNOSIS — M778 Other enthesopathies, not elsewhere classified: Secondary | ICD-10-CM

## 2017-06-10 DIAGNOSIS — M779 Enthesopathy, unspecified: Secondary | ICD-10-CM

## 2017-06-10 DIAGNOSIS — M1000 Idiopathic gout, unspecified site: Secondary | ICD-10-CM | POA: Diagnosis not present

## 2017-06-10 DIAGNOSIS — M7751 Other enthesopathy of right foot: Secondary | ICD-10-CM

## 2017-06-10 DIAGNOSIS — M79674 Pain in right toe(s): Secondary | ICD-10-CM

## 2017-06-10 LAB — CBC WITH DIFFERENTIAL/PLATELET
BASOS PCT: 0.3 %
Basophils Absolute: 20 cells/uL (ref 0–200)
EOS PCT: 4.3 %
Eosinophils Absolute: 292 cells/uL (ref 15–500)
HEMATOCRIT: 41.3 % (ref 38.5–50.0)
HEMOGLOBIN: 14.4 g/dL (ref 13.2–17.1)
LYMPHS ABS: 1775 {cells}/uL (ref 850–3900)
MCH: 31.8 pg (ref 27.0–33.0)
MCHC: 34.9 g/dL (ref 32.0–36.0)
MCV: 91.2 fL (ref 80.0–100.0)
MPV: 11.8 fL (ref 7.5–12.5)
Monocytes Relative: 8.1 %
NEUTROS ABS: 4162 {cells}/uL (ref 1500–7800)
NEUTROS PCT: 61.2 %
Platelets: 202 10*3/uL (ref 140–400)
RBC: 4.53 10*6/uL (ref 4.20–5.80)
RDW: 13 % (ref 11.0–15.0)
Total Lymphocyte: 26.1 %
WBC: 6.8 10*3/uL (ref 3.8–10.8)
WBCMIX: 551 {cells}/uL (ref 200–950)

## 2017-06-10 LAB — URIC ACID: Uric Acid, Serum: 6.9 mg/dL (ref 4.0–8.0)

## 2017-06-10 MED ORDER — TRIAMCINOLONE ACETONIDE 10 MG/ML IJ SUSP: 10.0000 mg | Freq: Once | INTRAMUSCULAR | Status: DC

## 2017-06-10 MED ORDER — COLCHICINE 0.6 MG PO TABS: 0.6000 mg | ORAL_TABLET | Freq: Every day | ORAL | 0 refills | Status: DC

## 2017-06-10 NOTE — Patient Instructions (Signed)

## 2017-06-10 NOTE — Progress Notes (Signed)
Subjective: Larry Rose is a 51 y.o. male patient who presents to office for evaluation of Right foot pain. Patient complains of progressive pain especially over the last 2 weeks at right big toe joint. Ranks pain 8/10. Admits to warmth, redness, and swelling to the area that is unrelieved. Patient has tried motrin, ice, stretching with no relief in symptoms. Admits to previous gouty attack at ankle on right and change in diet. Patient denies any other pedal complaints.   FBS not recorded   Diabetic on insulin diagnosed many years ago  Review of Systems  All other systems reviewed and are negative.    Patient Active Problem List   Diagnosis Date Noted  . Wellness examination 04/24/2016  . Dyspnea 04/24/2016  . Pain in joint, ankle and foot 11/25/2014  . Finger pain, right 11/25/2014  . Diabetes (HCC) 06/21/2014  . Routine general medical examination at a health care facility 11/17/2013  . Encounter for long-term (current) use of other medications 12/17/2012  . Hyposmolality and/or hyponatremia 05/21/2011  . Conductive hearing loss, unilateral 05/21/2011  . ROSACEA 05/14/2010  . ALCOHOLISM 11/30/2009  . TINEA CRURIS 11/07/2008  . CARPAL TUNNEL SYNDROME, RIGHT 10/06/2008  . Dyslipidemia 09/29/2007  . Essential hypertension 04/01/2007  . ALLERGIC RHINITIS 04/01/2007    Current Outpatient Prescriptions on File Prior to Visit  Medication Sig Dispense Refill  . BD PEN NEEDLE NANO U/F 32G X 4 MM MISC USE 3 TIMES PER DAY. 100 each 11  . glucose blood (ONE TOUCH ULTRA TEST) test strip 1 each by Other route 2 (two) times daily. And lancets 2/day 250.01 100 each 12  . insulin aspart (NOVOLOG FLEXPEN) 100 UNIT/ML FlexPen Inject 190 Units into the skin 3 (three) times daily with meals. And pen needles 3/day 195 mL 11  . lisinopril-hydrochlorothiazide (PRINZIDE,ZESTORETIC) 20-12.5 MG tablet TAKE 1 TABLET BY MOUTH EVERY DAY 30 tablet 11  . Multiple Vitamin (MULTIVITAMIN) tablet Take 1  tablet by mouth daily.      . triamcinolone ointment (KENALOG) 0.5 % Apply 1 application topically 3 (three) times daily. As needed for skin nodule 30 g 2   No current facility-administered medications on file prior to visit.     Allergies  Allergen Reactions  . Enalapril Maleate   . Lovastatin     Objective:  General: Alert and oriented x3 in no acute distress  Dermatology: Focal Swelling, warmth, redness present on the Right 1st MTPJ, No open lesions bilateral lower extremities, no webspace macerations, no ecchymosis bilateral, all nails x 10 are well manicured.  Vascular: Dorsalis Pedis and Posterior Tibial pedal pulses 2/4, Capillary Fill Time 3 seconds,(+) pedal hair growth bilateral,Temperature gradient increased over the right 1st MTPJ.  Neurology: Gross sensation intact via light touch bilateral, (- )Tinels sign.  Musculoskeletal: There is tenderness with palpation at Right 1st MTPJ with bunion,No pain with calf compression bilateral. All joint range of motion is within normal limits except at the right 1st MTPJ where there is pain and limitation, Strength within normal limits in all groups bilateral.   Gait: Unassisted, Antalgic gait avoiding weight on right foot  Xrays  Right Foot    Impression: Early squaring of 1st met head, early bunion, mild inferior heel spur. No other acute findings.       Assessment and Plan: Problem List Items Addressed This Visit    None    Visit Diagnoses    Toe pain, right    -  Primary   Relevant Medications     triamcinolone acetonide (KENALOG) 10 MG/ML injection 10 mg   Other Relevant Orders   DG Foot Complete Right   Capsulitis of foot, right       Relevant Medications   triamcinolone acetonide (KENALOG) 10 MG/ML injection 10 mg   Acute idiopathic gout, unspecified site       Relevant Medications   triamcinolone acetonide (KENALOG) 10 MG/ML injection 10 mg   colchicine 0.6 MG tablet   Other Relevant Orders   Uric Acid   CBC with  Differential       -Complete examination performed -Xrays reviewed -Discussed treatement options for gouty arthritis and gout education provided vs capsulitis  - After oral consent, injected right 1st MTPJ with 1cc lidocaine and marcaine plain mixed with 0.25cc Kenalog-10 and Dexmethasone phosphate without complication; post injection care explained. -Rx Colchicine 0.6mg -Ordered arthritic lab panel; will call patient with results if abnormal -Advised patient to call if symptoms are not improved within 1 week -Patient to return as needed if fail to improve for further discussion for long term management of gout or sooner if condition worsens.   , DPM 

## 2017-06-10 NOTE — Progress Notes (Signed)
   Subjective:    Patient ID: Larry FerrariBrian S Wombles, male    DOB: July 30, 1966, 51 y.o.   MRN: [id number]  HPI    Review of Systems  All other systems reviewed and are negative.      Objective:   Physical Exam        Assessment & Plan:

## 2017-06-11 ENCOUNTER — Telehealth: Payer: Self-pay | Admitting: *Deleted

## 2017-06-11 NOTE — Telephone Encounter (Signed)
-----   Message from Asencion Islamitorya Stover, [place] sent at 06/11/2017  7:38 AM EDT ----- Please let patient know that Uric Acid was elevated but still within in normal range likely he is getting over a gouty attack or was just dealing with an inflamed bunion. Patient to continue with diet modification, rest, ice, elevation and Colchicine if needed if symptoms are not improved from the injection that I gave him. -Dr. Marylene LandStover

## 2017-06-11 NOTE — Telephone Encounter (Signed)
I informed pt of Dr. Wynema BirchStover's review of results and recommendation. Pt states understanding and that he wished he had asked Dr. 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 I told pt I could write him a note and he stated that was okay he didn't know the fax number but may call tomorrow if he needs one.

## 2017-06-24 ENCOUNTER — Other Ambulatory Visit: Payer: Self-pay | Admitting: Endocrinology

## 2017-06-27 ENCOUNTER — Ambulatory Visit: Payer: BLUE CROSS/BLUE SHIELD | Admitting: Endocrinology

## 2017-06-27 ENCOUNTER — Encounter: Payer: Self-pay | Admitting: Endocrinology

## 2017-06-27 VITALS — BP 122/62 | HR 71 | Wt 304.4 lb

## 2017-06-27 DIAGNOSIS — E119 Type 2 diabetes mellitus without complications: Secondary | ICD-10-CM | POA: Diagnosis not present

## 2017-06-27 LAB — POCT GLYCOSYLATED HEMOGLOBIN (HGB A1C): HEMOGLOBIN A1C: 8.1

## 2017-06-27 MED ORDER — INSULIN REGULAR HUMAN (CONC) 500 UNIT/ML ~~LOC~~ SOPN: 210.0000 [IU] | PEN_INJECTOR | Freq: Three times a day (TID) | SUBCUTANEOUS | 11 refills | Status: DC

## 2017-06-27 NOTE — Patient Instructions (Signed)
Please change the insulin to U-500, 210 units 3 times a day (just before each meal) check your blood sugar twice a day.  vary the time of day when you check, between before the 3 meals, and at bedtime.  also check if you have symptoms of your blood sugar being too high or too low.  please keep a record of the readings and bring it to your next appointment here (or you can bring the meter itself).  You can write it on any piece of paper.  please call us sooner if your blood sugar goes below 70, or if you have a lot of readings over 200. Please come back for a follow-up appointment in 2-3 months.

## 2017-06-27 NOTE — Progress Notes (Signed)
Subjective:    Patient ID: Larry Rose, male    DOB: 09/20/1965, 51 y.o.   MRN: [id number]  HPI Pt returns for f/u of diabetes mellitus:  DM type: Insulin-requiring type 2 Dx'ed: 1993 Complications: none Therapy: insulin since 2009.  DKA: never.  Severe hypoglycemia: never.  Pancreatitis: never.  Other: he takes multiple daily injections; he works rotating shifts; he declines weight-loss surgery; he also declines U-500.   Interval history:  He had a steroid injection into the right foot, approx 2 weeks ago.  He says this increased cbg's for a few days.  no cbg record, but states cbg's are approx 200.  There is no trend throughout the day. He says he never misses the insulin.   Past Medical History:  Diagnosis Date  . ALCOHOLISM 11/30/2009  . ALLERGIC RHINITIS 04/01/2007  . CARPAL TUNNEL SYNDROME, RIGHT 10/06/2008  . DIABETES MELLITUS, TYPE II 04/01/2007  . Hepatic steatosis   . HYPERLIPIDEMIA, ATHEROGENIC 09/29/2007  . HYPERTENSION 04/01/2007  . Rosacea 05/14/2010  . TINEA CRURIS 11/07/2008    Past Surgical History:  Procedure Laterality Date  . carpel tunnel    . FRACTURE SURGERY      Social History   Socioeconomic History  . Marital status: Married    Spouse name: Not on file  . Number of children: Not on file  . Years of education: Not on file  . Highest education level: Not on file  Social Needs  . Financial resource strain: Not on file  . Food insecurity - worry: Not on file  . Food insecurity - inability: Not on file  . Transportation needs - medical: Not on file  . Transportation needs - non-medical: Not on file  Occupational History  . Occupation: Works Archivistndustrial Manufacturing  Tobacco Use  . Smoking status: Never Smoker  . Smokeless tobacco: Never Used  Substance and Sexual Activity  . Alcohol use: Not on file  . Drug use: Not on file  . Sexual activity: Not on file  Other Topics Concern  . Not on file  Social History Narrative  . Not on file     Current Outpatient Medications on File Prior to Visit  Medication Sig Dispense Refill  . BD PEN NEEDLE NANO U/F 32G X 4 MM MISC USE 3 TIMES PER DAY. 100 each 11  . colchicine 0.6 MG tablet Take 1 tablet (0.6 mg total) by mouth daily. 10 tablet 0  . glucose blood (ONE TOUCH ULTRA TEST) test strip 1 each by Other route 2 (two) times daily. And lancets 2/day 250.01 100 each 12  . lisinopril-hydrochlorothiazide (PRINZIDE,ZESTORETIC) 20-12.5 MG tablet TAKE 1 TABLET BY MOUTH EVERY DAY 30 tablet 6  . Multiple Vitamin (MULTIVITAMIN) tablet Take 1 tablet by mouth daily.      Marland Kitchen. triamcinolone ointment (KENALOG) 0.5 % Apply 1 application topically 3 (three) times daily. As needed for skin nodule 30 g 2   Current Facility-Administered Medications on File Prior to Visit  Medication Dose Route Frequency Provider Last Rate Last Dose  . triamcinolone acetonide (KENALOG) 10 MG/ML injection 10 mg  10 mg Other Once Asencion IslamStover, [place], DPM        Allergies  Allergen Reactions  . Enalapril Maleate   . Lovastatin     Family History  Problem Relation Age of Onset  . Cancer Neg Hx     BP 122/62 (BP Location: Left Arm, Patient Position: Sitting, Cuff Size: Normal)   Pulse 71   Wt (!) 304  lb 6.4 oz (138.1 kg)   SpO2 97%   BMI 49.13 kg/m   Review of Systems He denies hypoglycemia    Objective:   Physical Exam VITAL SIGNS:  See vs page GENERAL: no distress Pulses: foot pulses are intact bilaterally.   MSK: no deformity of the feet or ankles.  CV: trace bilat edema of the legs. Skin:  no ulcer on the feet or ankles.  normal color and temp on the feet and ankles Neuro: sensation is intact to touch on the feet and ankles.   Ext: There is bilateral onychomycosis of the toenails   A1c=8.1%    Assessment & Plan:  Insulin-requiring type 2 DM: he needs increased rx.  We discussed rx options.  Hypertriglyceridemia: multiple daily injections help this Occupational status: multiple daily injections  are best in this situation.  Patient Instructions  Please change the insulin to U-500, 210 units 3 times a day (just before each meal) check your blood sugar twice a day.  vary the time of day when you check, between before the 3 meals, and at bedtime.  also check if you have symptoms of your blood sugar being too high or too low.  please keep a record of the readings and bring it to your next appointment here (or you can bring the meter itself).  You can write it on any piece of paper.  please call us sooner if your blood sugar goes below 70, or if you have a lot of readings over 200. Please come back for a follow-up appointment in 2-3 months.

## 2017-07-15 ENCOUNTER — Telehealth: Payer: Self-pay | Admitting: Endocrinology

## 2017-07-15 NOTE — Telephone Encounter (Signed)
Meisha with [employer] called re: They need to confirm how many units for 30 days Humulin. Please call her at ph# [phone]

## 2017-07-17 NOTE — Telephone Encounter (Signed)
I called and [employer] had already [place] [facility] to [facility].

## 2017-08-15 ENCOUNTER — Other Ambulatory Visit: Payer: Self-pay

## 2017-08-18 ENCOUNTER — Other Ambulatory Visit: Payer: Self-pay

## 2017-08-21 ENCOUNTER — Other Ambulatory Visit: Payer: Self-pay

## 2017-08-21 ENCOUNTER — Telehealth: Payer: Self-pay | Admitting: Endocrinology

## 2017-08-21 NOTE — Telephone Encounter (Signed)
Pt stated he is new insulin. And took 210 that afternoon about 130, ate, and by 8 he checked it an his sugar read 86-87. After ate dinner it jumped back up 210.   He has some question about the insulin  Please advise!  [phone] Pt stated to leave detail message on VM .

## 2017-08-21 NOTE — Telephone Encounter (Signed)
I called and asked patient to call back to discuss questions on insulin.

## 2017-09-01 NOTE — Telephone Encounter (Signed)
Error

## 2017-09-02 ENCOUNTER — Other Ambulatory Visit: Payer: Self-pay

## 2017-09-02 NOTE — Telephone Encounter (Signed)
Patient stated that the insurance has been trying to get in touch with Dr Everardo AllEllison about his insulin, insulin regular human CONCENTRATED (HUMULIN R U-500 KWIKPEN) 500 UNIT/ML kwikpen [[id number]][[id number]]    [id number] [place], [place] - [address] AT [facility] [address] DEA #:  [id number]      [phone] (Phone) [phone] (Fax)

## 2017-09-03 ENCOUNTER — Telehealth: Payer: Self-pay

## 2017-09-03 MED ORDER — INSULIN REGULAR HUMAN (CONC) 500 UNIT/ML ~~LOC~~ SOPN: 210.0000 [IU] | PEN_INJECTOR | Freq: Three times a day (TID) | SUBCUTANEOUS | 11 refills | Status: DC

## 2017-09-03 NOTE — Telephone Encounter (Signed)
I have called patient back & his PA for insulin has been approved. I resent his prescription to pharmacy.

## 2017-09-03 NOTE — Telephone Encounter (Signed)
Patient returned call-he saw that someone called but had no messages- re: discount card

## 2017-09-03 NOTE — Telephone Encounter (Signed)
I have called and left VM with patient on both numbers given to call back in regards to his insulin. The PA was approved back the end of December & I have submitted for refill to pharmacy.

## 2017-09-04 ENCOUNTER — Telehealth: Payer: Self-pay

## 2017-09-04 NOTE — Telephone Encounter (Signed)
Patient called and I set him up Monday appt. He has about a weeks worth of insulin left. I told him PA was approved but that was through old insurance. We don't have updated insurance card so he will get that on file Monday so I can initiate another PA if needed.

## 2017-09-04 NOTE — Telephone Encounter (Signed)
I tried to call patient but had to leave a VM. I asked if his insurance changed the beginning of the year because I was able to get his PA approved for Humulin R back 08/15/2017. [facility] that drug wasn't covered due to cost max per insurance. I then called [facility] to see if I could find out the reason insulin was being rejected & it was because $13,000 that was submitted to insurance by pharmacy. Pharmacy will be contacting insurance to find out what needs to be done & will send me the response via fax.

## 2017-09-05 NOTE — Telephone Encounter (Signed)
Pt has only one pen left of insulin he was mistaken yesterday and I put in his new insurance info in, still awaiting card

## 2017-09-08 ENCOUNTER — Ambulatory Visit: Payer: BLUE CROSS/BLUE SHIELD | Admitting: Endocrinology

## 2017-09-08 ENCOUNTER — Telehealth: Payer: Self-pay

## 2017-09-08 ENCOUNTER — Encounter: Payer: Self-pay | Admitting: Endocrinology

## 2017-09-08 VITALS — BP 127/75 | HR 69 | Wt 318.2 lb

## 2017-09-08 DIAGNOSIS — E119 Type 2 diabetes mellitus without complications: Secondary | ICD-10-CM

## 2017-09-08 LAB — POCT GLYCOSYLATED HEMOGLOBIN (HGB A1C): HEMOGLOBIN A1C: 7.1

## 2017-09-08 MED ORDER — INSULIN REGULAR HUMAN (CONC) 500 UNIT/ML ~~LOC~~ SOPN: PEN_INJECTOR | SUBCUTANEOUS | 11 refills | Status: DC

## 2017-09-08 NOTE — Telephone Encounter (Signed)
I tried to call [employer] but they are closed due to the holiday. I did submit new PA online through [employer] My Meds.

## 2017-09-08 NOTE — Telephone Encounter (Signed)
Patient called stated insurance told him Dr Everardo AllEllison has to call the insurance Co. to ok approve his insulin  insulin regular human CONCENTRATED (HUMULIN R U-500 KWIKPEN) 500 UNIT/ML kwikpen [[id number]][[id number]]    [id number] [place], [place] - [address] AT [facility] [address] [phone] (Phone) [phone] (Fax)

## 2017-09-08 NOTE — Progress Notes (Signed)
Subjective:    Patient ID: Larry Rose, male    DOB: 1966/03/15, 52 y.o.   MRN: [id number]  HPI Pt returns for f/u of diabetes mellitus:  DM type: Insulin-requiring type 2 Dx'ed: 1993 Complications: none Therapy: insulin since 2009.  DKA: never.  Severe hypoglycemia: never.  Pancreatitis: never.  Other: he takes multiple daily injections; he works rotating shifts; he declines weight-loss surgery.   Interval history:  No recent steroids. no cbg record, but states cbg's are mildly low in the afternoon.  He says he never misses the insulin.  pt states he feels well in general.  Past Medical History:  Diagnosis Date  . ALCOHOLISM 11/30/2009  . ALLERGIC RHINITIS 04/01/2007  . CARPAL TUNNEL SYNDROME, RIGHT 10/06/2008  . DIABETES MELLITUS, TYPE II 04/01/2007  . Hepatic steatosis   . HYPERLIPIDEMIA, ATHEROGENIC 09/29/2007  . HYPERTENSION 04/01/2007  . Rosacea 05/14/2010  . TINEA CRURIS 11/07/2008    Past Surgical History:  Procedure Laterality Date  . carpel tunnel    . FRACTURE SURGERY      Social History   Socioeconomic History  . Marital status: Married    Spouse name: Not on file  . Number of children: Not on file  . Years of education: Not on file  . Highest education level: Not on file  Social Needs  . Financial resource strain: Not on file  . Food insecurity - worry: Not on file  . Food insecurity - inability: Not on file  . Transportation needs - medical: Not on file  . Transportation needs - non-medical: Not on file  Occupational History  . Occupation: Works Archivist  . Smoking status: Never Smoker  . Smokeless tobacco: Never Used  Substance and Sexual Activity  . Alcohol use: Not on file  . Drug use: Not on file  . Sexual activity: Not on file  Other Topics Concern  . Not on file  Social History Narrative  . Not on file    Current Outpatient Medications on File Prior to Visit  Medication Sig Dispense Refill  . BD PEN  NEEDLE NANO U/F 32G X 4 MM MISC USE 3 TIMES PER DAY. 100 each 11  . colchicine 0.6 MG tablet Take 1 tablet (0.6 mg total) by mouth daily. 10 tablet 0  . glucose blood (ONE TOUCH ULTRA TEST) test strip 1 each by Other route 2 (two) times daily. And lancets 2/day 250.01 100 each 12  . lisinopril-hydrochlorothiazide (PRINZIDE,ZESTORETIC) 20-12.5 MG tablet TAKE 1 TABLET BY MOUTH EVERY DAY 30 tablet 6  . Multiple Vitamin (MULTIVITAMIN) tablet Take 1 tablet by mouth daily.      Marland Kitchen triamcinolone ointment (KENALOG) 0.5 % Apply 1 application topically 3 (three) times daily. As needed for skin nodule 30 g 2   Current Facility-Administered Medications on File Prior to Visit  Medication Dose Route Frequency Provider Last Rate Last Dose  . triamcinolone acetonide (KENALOG) 10 MG/ML injection 10 mg  10 mg Other Once Asencion Islam, DPM        Allergies  Allergen Reactions  . Enalapril Maleate   . Lovastatin     Family History  Problem Relation Age of Onset  . Cancer Neg Hx     BP 127/75 (BP Location: Left Arm, Patient Position: Sitting, Cuff Size: Normal)   Pulse 69   Wt (!) 318 lb 3.2 oz (144.3 kg)   SpO2 96%   BMI 51.36 kg/m    Review of Systems He denies  LOC    Objective:   Physical Exam VITAL SIGNS:  See vs page GENERAL: no distress Pulses: foot pulses are intact bilaterally.   MSK: no deformity of the feet or ankles.  CV: trace bilat edema of the legs. Skin:  no ulcer on the feet or ankles.  normal color and temp on the feet and ankles Neuro: sensation is intact to touch on the feet and ankles.   Ext: There is bilateral onychomycosis of the toenails   Lab Results  Component Value Date   HGBA1C 7.1 09/08/2017      Assessment & Plan:  Insulin-requiring type 2 DM: The pattern of his cbg's indicates he needs some adjustment in his therapy Obesity: worse.  We again discussed the need for surgery for this.  Patient Instructions  Please change the insulin to 3 times a day  (just before each meal), 230-190-230 units. check your blood sugar twice a day.  vary the time of day when you check, between before the 3 meals, and at bedtime.  also check if you have symptoms of your blood sugar being too high or too low.  please keep a record of the readings and bring it to your next appointment here (or you can bring the meter itself).  You can write it on any piece of paper.  please call us sooner if your blood sugar goes below 70, or if you have a lot of readings over 200. Please come back for a follow-up appointment in 3 months.      Bariatric Surgery You have so much to gain by losing weight.  You may have already tried every diet and exercise plan imaginable.  And, you may have sought advice from your family physician, too.   Sometimes, in spite of such diligent efforts, you may not be able to achieve long-term results by yourself.  In cases of severe obesity, bariatric or weight loss surgery is a proven method of achieving long-term weight control.  Our Services Our bariatric surgery programs offer our patients new hope and long-term weight-loss solution.  Since introducing our services in 2003, we have conducted more than 2,400 successful procedures.  Our program is designated as a Investment banker, corporateComprehensive Center by the Metabolic and Bariatric Surgery Accreditation and Quality Improvement Program (MBSAQIP), a Child psychotherapistnational accrediting body that sets rigorous patient safety and outcome standards.  Our program is also designated as a Engineer, manufacturing systemsCenter of Excellence by [employer] insurance companies.   Our exceptional weight-loss surgery team specializes in diagnosis, treatment, follow-up care, and ongoing support for our patients with severe weight loss challenges.  We currently offer laparoscopic sleeve gastrectomy, gastric bypass, and adjustable gastric band (LAP-BAND).    Attend our Bariatrics Seminar Choosing to undergo a bariatric procedure is a big decision, and one that should not be taken lightly.   You now have two options in how you learn about weight-loss surgery - in person or online.  Our objective is to ensure you have all of the information that you need to evaluate the advantages and obligations of this life changing procedure.  Please note that you are not alone in this process, and our experienced team is ready to assist and answer all of your questions.  There are several ways to register for a seminar (either on-line or in person): 1)  Call [phone] 2) Go on-line to [facility] and register for either type of seminar.  [email or web address]

## 2017-09-08 NOTE — Patient Instructions (Addendum)
Please change the insulin to 3 times a day (just before each meal), 230-190-230 units. check your blood sugar twice a day.  vary the time of day when you check, between before the 3 meals, and at bedtime.  also check if you have symptoms of your blood sugar being too high or too low.  please keep a record of the readings and bring it to your next appointment here (or you can bring the meter itself).  You can write it on any piece of paper.  please call us sooner if your blood sugar goes below 70, or if you have a lot of readings over 200. Please come back for a follow-up appointment in 3 months.      Bariatric Surgery You have so much to gain by losing weight.  You may have already tried every diet and exercise plan imaginable.  And, you may have sought advice from your family physician, too.   Sometimes, in spite of such diligent efforts, you may not be able to achieve long-term results by yourself.  In cases of severe obesity, bariatric or weight loss surgery is a proven method of achieving long-term weight control.  Our Services Our bariatric surgery programs offer our patients new hope and long-term weight-loss solution.  Since introducing our services in 2003, we have conducted more than 2,400 successful procedures.  Our program is designated as a Investment banker, corporateComprehensive Center by the Metabolic and Bariatric Surgery Accreditation and Quality Improvement Program (MBSAQIP), a Child psychotherapistnational accrediting body that sets rigorous patient safety and outcome standards.  Our program is also designated as a Engineer, manufacturing systemsCenter of Excellence by [employer] insurance companies.   Our exceptional weight-loss surgery team specializes in diagnosis, treatment, follow-up care, and ongoing support for our patients with severe weight loss challenges.  We currently offer laparoscopic sleeve gastrectomy, gastric bypass, and adjustable gastric band (LAP-BAND).    Attend our Bariatrics Seminar Choosing to undergo a bariatric procedure is a big decision,  and one that should not be taken lightly.  You now have two options in how you learn about weight-loss surgery - in person or online.  Our objective is to ensure you have all of the information that you need to evaluate the advantages and obligations of this life changing procedure.  Please note that you are not alone in this process, and our experienced team is ready to assist and answer all of your questions.  There are several ways to register for a seminar (either on-line or in person): 1)  Call [phone] 2) Go on-line to [facility] and register for either type of seminar.  [email or web address]

## 2017-09-09 ENCOUNTER — Other Ambulatory Visit: Payer: Self-pay

## 2017-09-09 NOTE — Telephone Encounter (Signed)
Patient is calling on the status of insulin    insulin regular human CONCENTRATED (HUMULIN R U-500 KWIKPEN) 500 UNIT/ML kwikpen [[id number]][[id number]]   Please advise

## 2017-09-09 NOTE — Telephone Encounter (Signed)
I have called patient & pharmacy. I was told by pharmacist that prescription would be $50 then he has copay card for $25.

## 2017-09-26 ENCOUNTER — Ambulatory Visit: Payer: BLUE CROSS/BLUE SHIELD | Admitting: Endocrinology

## 2017-12-15 ENCOUNTER — Ambulatory Visit: Payer: Managed Care, Other (non HMO) | Admitting: Endocrinology

## 2017-12-21 ENCOUNTER — Other Ambulatory Visit: Payer: Self-pay | Admitting: Endocrinology

## 2017-12-26 ENCOUNTER — Ambulatory Visit: Payer: Managed Care, Other (non HMO) | Admitting: Endocrinology

## 2017-12-26 ENCOUNTER — Encounter: Payer: Self-pay | Admitting: Endocrinology

## 2017-12-26 VITALS — BP 128/68 | HR 84 | Wt 319.1 lb

## 2017-12-26 DIAGNOSIS — E119 Type 2 diabetes mellitus without complications: Secondary | ICD-10-CM | POA: Diagnosis not present

## 2017-12-26 DIAGNOSIS — M1000 Idiopathic gout, unspecified site: Secondary | ICD-10-CM

## 2017-12-26 MED ORDER — ALLOPURINOL 100 MG PO TABS: 100.0000 mg | ORAL_TABLET | Freq: Every day | ORAL | 3 refills | Status: DC

## 2017-12-26 MED ORDER — GLUCOSE BLOOD VI STRP: 1.0000 | ORAL_STRIP | Freq: Two times a day (BID) | 12 refills | Status: DC

## 2017-12-26 MED ORDER — INSULIN REGULAR HUMAN (CONC) 500 UNIT/ML ~~LOC~~ SOPN: PEN_INJECTOR | SUBCUTANEOUS | 11 refills | Status: DC

## 2017-12-26 MED ORDER — COLCHICINE 0.6 MG PO TABS: 0.6000 mg | ORAL_TABLET | Freq: Every day | ORAL | 0 refills | Status: DC

## 2017-12-26 NOTE — Patient Instructions (Addendum)
Please reduce the insulin to 3 times a day (just before each meal), 230-170-230 units.   check your blood sugar twice a day.  vary the time of day when you check, between before the 3 meals, and at bedtime.  also check if you have symptoms of your blood sugar being too high or too low.  please keep a record of the readings and bring it to your next appointment here (or you can bring the meter itself).  You can write it on any piece of paper.  please call us sooner if your blood sugar goes below 70, or if you have a lot of readings over 200. I have sent a prescription to your pharmacy, to keep gout away.  If you need the as-needed gout pill, here is a prescription.  Please come back for a follow-up appointment in 3 months.

## 2017-12-26 NOTE — Progress Notes (Signed)
Subjective:    Patient ID: Larry Rose, male    DOB: 15-Sep-1965, 52 y.o.   MRN: [id number]  HPI Pt returns for f/u of diabetes mellitus:  DM type: Insulin-requiring type 2 Dx'ed: 1993 Complications: none Therapy: insulin since 2009.  DKA: never.  Severe hypoglycemia: never.  Pancreatitis: never.  Other: he takes multiple daily injections; he works rotating shifts; he declines weight-loss surgery.   Interval history:  No recent steroids. no cbg record, but states cbg's are often mildly low in the afternoon.  He says he never misses the insulin.   Right ankle pain has again worsened (no new injury) Past Medical History:  Diagnosis Date  . ALCOHOLISM 11/30/2009  . ALLERGIC RHINITIS 04/01/2007  . CARPAL TUNNEL SYNDROME, RIGHT 10/06/2008  . DIABETES MELLITUS, TYPE II 04/01/2007  . Hepatic steatosis   . HYPERLIPIDEMIA, ATHEROGENIC 09/29/2007  . HYPERTENSION 04/01/2007  . Rosacea 05/14/2010  . TINEA CRURIS 11/07/2008    Past Surgical History:  Procedure Laterality Date  . carpel tunnel    . FRACTURE SURGERY      Social History   Socioeconomic History  . Marital status: Married    Spouse name: Not on file  . Number of children: Not on file  . Years of education: Not on file  . Highest education level: Not on file  Occupational History  . Occupation: Works Agricultural engineer  . Financial resource strain: Not on file  . Food insecurity:    Worry: Not on file    Inability: Not on file  . Transportation needs:    Medical: Not on file    Non-medical: Not on file  Tobacco Use  . Smoking status: Never Smoker  . Smokeless tobacco: Never Used  Substance and Sexual Activity  . Alcohol use: Not on file  . Drug use: Not on file  . Sexual activity: Not on file  Lifestyle  . Physical activity:    Days per week: Not on file    Minutes per session: Not on file  . Stress: Not on file  Relationships  . Social connections:    Talks on phone: Not on file      Gets together: Not on file    Attends religious service: Not on file    Active member of club or organization: Not on file    Attends meetings of clubs or organizations: Not on file    Relationship status: Not on file  . Intimate partner violence:    Fear of current or ex partner: Not on file    Emotionally abused: Not on file    Physically abused: Not on file    Forced sexual activity: Not on file  Other Topics Concern  . Not on file  Social History Narrative  . Not on file    Current Outpatient Medications on File Prior to Visit  Medication Sig Dispense Refill  . lisinopril-hydrochlorothiazide (PRINZIDE,ZESTORETIC) 20-12.5 MG tablet TAKE 1 TABLET BY MOUTH EVERY DAY 30 tablet 6  . Multiple Vitamin (MULTIVITAMIN) tablet Take 1 tablet by mouth daily.      Marland Kitchen triamcinolone ointment (KENALOG) 0.5 % Apply 1 application topically 3 (three) times daily. As needed for skin nodule 30 g 2   Current Facility-Administered Medications on File Prior to Visit  Medication Dose Route Frequency Provider Last Rate Last Dose  . triamcinolone acetonide (KENALOG) 10 MG/ML injection 10 mg  10 mg Other Once Asencion Islam, DPM  Allergies  Allergen Reactions  . Enalapril Maleate   . Lovastatin     Family History  Problem Relation Age of Onset  . Cancer Neg Hx     BP 128/68   Pulse 84   Wt (!) 319 lb 1.6 oz (144.7 kg)   SpO2 96%   BMI 51.50 kg/m   Review of Systems Denies LOC    Objective:   Physical Exam VITAL SIGNS:  See vs page GENERAL: no distress Pulses: dorsalis pedis intact bilat.   MSK: no deformity of the feet.  Right ankle is slightly swollen, but no tend/erythema/warmth CV: no leg edema Skin:  no ulcer on the feet.  normal color and temp on the feet. Neuro: sensation is intact to touch on the feet  A1c=5.7%     Assessment & Plan:  Insulin-requiring type 2 DM: overcontrolled.  Right ankle pain: worse, poss due to gout.  I offered ref ortho, but he declines.     Patient Instructions  Please reduce the insulin to 3 times a day (just before each meal), 230-170-230 units.   check your blood sugar twice a day.  vary the time of day when you check, between before the 3 meals, and at bedtime.  also check if you have symptoms of your blood sugar being too high or too low.  please keep a record of the readings and bring it to your next appointment here (or you can bring the meter itself).  You can write it on any piece of paper.  please call us sooner if your blood sugar goes below 70, or if you have a lot of readings over 200. I have sent a prescription to your pharmacy, to keep gout away.  If you need the as-needed gout pill, here is a prescription.  Please come back for a follow-up appointment in 3 months.

## 2018-01-13 ENCOUNTER — Other Ambulatory Visit: Payer: Self-pay | Admitting: Endocrinology

## 2018-03-30 ENCOUNTER — Ambulatory Visit: Payer: Managed Care, Other (non HMO) | Admitting: Endocrinology

## 2018-03-30 ENCOUNTER — Encounter: Payer: Self-pay | Admitting: Endocrinology

## 2018-03-30 VITALS — BP 134/86 | HR 80 | Wt 318.0 lb

## 2018-03-30 DIAGNOSIS — E119 Type 2 diabetes mellitus without complications: Secondary | ICD-10-CM

## 2018-03-30 LAB — POCT GLYCOSYLATED HEMOGLOBIN (HGB A1C): Hemoglobin A1C: 6.6 % — AB (ref 4.0–5.6)

## 2018-03-30 MED ORDER — INSULIN REGULAR HUMAN (CONC) 500 UNIT/ML ~~LOC~~ SOPN: PEN_INJECTOR | SUBCUTANEOUS | 11 refills | Status: DC

## 2018-03-30 NOTE — Progress Notes (Signed)
Subjective:    Patient ID: Larry Rose, male    DOB: 1966/03/10, 52 y.o.   MRN: [id number]  HPI Pt returns for f/u of diabetes mellitus:  DM type: Insulin-requiring type 2 Dx'ed: 1993 Complications: none Therapy: insulin since 2009.  DKA: never.  Severe hypoglycemia: never.  Pancreatitis: never.  Other: he takes multiple daily injections; he works rotating shifts; he declines weight-loss surgery.   Interval history:  No recent steroids. no cbg record, but states cbg's are sonetimes mildly low in the morning, after he works overnight.  He says he never misses the insulin.   Past Medical History:  Diagnosis Date  . ALCOHOLISM 11/30/2009  . ALLERGIC RHINITIS 04/01/2007  . CARPAL TUNNEL SYNDROME, RIGHT 10/06/2008  . DIABETES MELLITUS, TYPE II 04/01/2007  . Hepatic steatosis   . HYPERLIPIDEMIA, ATHEROGENIC 09/29/2007  . HYPERTENSION 04/01/2007  . Rosacea 05/14/2010  . TINEA CRURIS 11/07/2008    Past Surgical History:  Procedure Laterality Date  . carpel tunnel    . FRACTURE SURGERY      Social History   Socioeconomic History  . Marital status: Married    Spouse name: Not on file  . Number of children: Not on file  . Years of education: Not on file  . Highest education level: Not on file  Occupational History  . Occupation: Works Agricultural engineerndustrial Manufacturing  Social Needs  . Financial resource strain: Not on file  . Food insecurity:    Worry: Not on file    Inability: Not on file  . Transportation needs:    Medical: Not on file    Non-medical: Not on file  Tobacco Use  . Smoking status: Never Smoker  . Smokeless tobacco: Never Used  Substance and Sexual Activity  . Alcohol use: Not on file  . Drug use: Not on file  . Sexual activity: Not on file  Lifestyle  . Physical activity:    Days per week: Not on file    Minutes per session: Not on file  . Stress: Not on file  Relationships  . Social connections:    Talks on phone: Not on file    Gets together: Not on  file    Attends religious service: Not on file    Active member of club or organization: Not on file    Attends meetings of clubs or organizations: Not on file    Relationship status: Not on file  . Intimate partner violence:    Fear of current or ex partner: Not on file    Emotionally abused: Not on file    Physically abused: Not on file    Forced sexual activity: Not on file  Other Topics Concern  . Not on file  Social History Narrative  . Not on file    Current Outpatient Medications on File Prior to Visit  Medication Sig Dispense Refill  . allopurinol (ZYLOPRIM) 100 MG tablet Take 1 tablet (100 mg total) by mouth daily. 90 tablet 3  . glucose blood (ONE TOUCH ULTRA TEST) test strip 1 each by Other route 2 (two) times daily. And lancets 2/day 250.01 100 each 12  . lisinopril-hydrochlorothiazide (PRINZIDE,ZESTORETIC) 20-12.5 MG tablet TAKE 1 TABLET BY MOUTH EVERY DAY 30 tablet 6  . Multiple Vitamin (MULTIVITAMIN) tablet Take 1 tablet by mouth daily.      Marland Kitchen. triamcinolone ointment (KENALOG) 0.5 % Apply 1 application topically 3 (three) times daily. As needed for skin nodule 30 g 2   Current Facility-Administered Medications on  File Prior to Visit  Medication Dose Route Frequency Provider Last Rate Last Dose  . triamcinolone acetonide (KENALOG) 10 MG/ML injection 10 mg  10 mg Other Once Asencion IslamStover, Titorya, DPM        Allergies  Allergen Reactions  . Enalapril Maleate   . Lovastatin     Family History  Problem Relation Age of Onset  . Cancer Neg Hx     BP 134/86 (BP Location: Left Arm, Patient Position: Sitting, Cuff Size: Large)   Pulse 80   Wt (!) 318 lb (144.2 kg)   BMI 51.33 kg/m    Review of Systems Denies LOC    Objective:   Physical Exam VITAL SIGNS:  See vs page GENERAL: no distress Pulses: foot pulses are intact bilaterally.   MSK: no deformity of the feet or ankles.  CV: 1+ bilat edema of the legs.   Skin:  no ulcer on the feet or ankles.  normal color  and temp on the feet and ankles Neuro: sensation is intact to touch on the feet and ankles.     Lab Results  Component Value Date   HGBA1C 6.6 (A) 03/30/2018       Assessment & Plan:  Insulin-requiring type 2 DM: overcontrolled.   Patient Instructions  Please reduce the insulin to 3 times a day (just before each meal), 220-160-230 units.   check your blood sugar twice a day.  vary the time of day when you check, between before the 3 meals, and at bedtime.  also check if you have symptoms of your blood sugar being too high or too low.  please keep a record of the readings and bring it to your next appointment here (or you can bring the meter itself).  You can write it on any piece of paper.  please call us sooner if your blood sugar goes below 70, or if you have a lot of readings over 200.   Please come back for a regular physical appointment in 2 months.

## 2018-03-30 NOTE — Patient Instructions (Addendum)
Please reduce the insulin to 3 times a day (just before each meal), 220-160-230 units.   check your blood sugar twice a day.  vary the time of day when you check, between before the 3 meals, and at bedtime.  also check if you have symptoms of your blood sugar being too high or too low.  please keep a record of the readings and bring it to your next appointment here (or you can bring the meter itself).  You can write it on any piece of paper.  please call us sooner if your blood sugar goes below 70, or if you have a lot of readings over 200.   Please come back for a regular physical appointment in 2 months.

## 2018-04-09 ENCOUNTER — Telehealth: Payer: Self-pay | Admitting: Endocrinology

## 2018-04-09 NOTE — Telephone Encounter (Signed)
Patient is losing his health insurance at midnight tonight. Patient is requesting RX's for Humalin RU500, DV Ultra Fine Pen Needles AND Lisinopril HCTZ20-12.5 sent to [facility] [address] asap. Patient needs to have it before midnight tonight.

## 2018-04-09 NOTE — Telephone Encounter (Signed)
Called and spoke to patient informing him that he has refills on file to contact pharmacy. Understanding verbalized nothing further needed at this this.

## 2018-06-02 ENCOUNTER — Ambulatory Visit: Payer: Commercial Managed Care - PPO | Admitting: Endocrinology

## 2018-06-02 ENCOUNTER — Encounter: Payer: Self-pay | Admitting: Endocrinology

## 2018-06-02 VITALS — BP 124/70 | HR 70 | Ht 66.0 in | Wt 323.8 lb

## 2018-06-02 DIAGNOSIS — I10 Essential (primary) hypertension: Secondary | ICD-10-CM

## 2018-06-02 DIAGNOSIS — E119 Type 2 diabetes mellitus without complications: Secondary | ICD-10-CM | POA: Diagnosis not present

## 2018-06-02 DIAGNOSIS — Z0000 Encounter for general adult medical examination without abnormal findings: Secondary | ICD-10-CM | POA: Diagnosis not present

## 2018-06-02 DIAGNOSIS — Z23 Encounter for immunization: Secondary | ICD-10-CM

## 2018-06-02 LAB — URINALYSIS, ROUTINE W REFLEX MICROSCOPIC
Bilirubin Urine: NEGATIVE
HGB URINE DIPSTICK: NEGATIVE
Ketones, ur: NEGATIVE
LEUKOCYTES UA: NEGATIVE
NITRITE: NEGATIVE
RBC / HPF: NONE SEEN (ref 0–?)
SPECIFIC GRAVITY, URINE: 1.015 (ref 1.000–1.030)
Total Protein, Urine: NEGATIVE
URINE GLUCOSE: NEGATIVE
Urobilinogen, UA: 0.2 (ref 0.0–1.0)
WBC UA: NONE SEEN (ref 0–?)
pH: 6 (ref 5.0–8.0)

## 2018-06-02 LAB — MICROALBUMIN / CREATININE URINE RATIO
Creatinine,U: 82.1 mg/dL
MICROALB UR: 0.9 mg/dL (ref 0.0–1.9)
MICROALB/CREAT RATIO: 1.1 mg/g (ref 0.0–30.0)

## 2018-06-02 LAB — BASIC METABOLIC PANEL
BUN: 11 mg/dL (ref 6–23)
CALCIUM: 9.4 mg/dL (ref 8.4–10.5)
CO2: 33 mEq/L — ABNORMAL HIGH (ref 19–32)
Chloride: 96 mEq/L (ref 96–112)
Creatinine, Ser: 0.84 mg/dL (ref 0.40–1.50)
GFR: 102.88 mL/min (ref >60.00)
Glucose, Bld: 293 mg/dL — ABNORMAL HIGH (ref 70–99)
POTASSIUM: 5.3 meq/L — AB (ref 3.5–5.1)
SODIUM: 134 meq/L — AB (ref 135–145)

## 2018-06-02 LAB — CBC WITH DIFFERENTIAL/PLATELET
BASOS ABS: 0 10*3/uL (ref 0.0–0.1)
Basophils Relative: 0.4 % (ref 0.0–3.0)
EOS ABS: 0.3 10*3/uL (ref 0.0–0.7)
Eosinophils Relative: 4.1 % (ref 0.0–5.0)
HCT: 43.1 % (ref 39.0–52.0)
Hemoglobin: 14.4 g/dL (ref 13.0–17.0)
LYMPHS ABS: 1.8 10*3/uL (ref 0.7–4.0)
LYMPHS PCT: 28.5 % (ref 12.0–46.0)
MCHC: 33.5 g/dL (ref 30.0–36.0)
MCV: 93.3 fl (ref 78.0–100.0)
MONO ABS: 0.5 10*3/uL (ref 0.1–1.0)
MONOS PCT: 7.7 % (ref 3.0–12.0)
NEUTROS ABS: 3.8 10*3/uL (ref 1.4–7.7)
NEUTROS PCT: 59.3 % (ref 43.0–77.0)
Platelets: 181 10*3/uL (ref 150.0–400.0)
RBC: 4.62 Mil/uL (ref 4.22–5.81)
RDW: 14.3 % (ref 11.5–15.5)
WBC: 6.3 10*3/uL (ref 4.0–10.5)

## 2018-06-02 LAB — LIPID PANEL
CHOLESTEROL: 245 mg/dL — AB (ref 0–200)
HDL: 27.7 mg/dL — ABNORMAL LOW (ref >39.00)
Total CHOL/HDL Ratio: 9
Triglycerides: 725 mg/dL — ABNORMAL HIGH (ref 0.0–149.0)

## 2018-06-02 LAB — POCT GLYCOSYLATED HEMOGLOBIN (HGB A1C): HEMOGLOBIN A1C: 7.5 % — AB (ref 4.0–5.6)

## 2018-06-02 LAB — TSH: TSH: 4.36 u[IU]/mL (ref 0.35–4.50)

## 2018-06-02 LAB — HEPATIC FUNCTION PANEL
ALK PHOS: 60 U/L (ref 39–117)
ALT: 36 U/L (ref 0–53)
AST: 26 U/L (ref 0–37)
Albumin: 4 g/dL (ref 3.5–5.2)
BILIRUBIN DIRECT: 0.1 mg/dL (ref 0.0–0.3)
BILIRUBIN TOTAL: 0.5 mg/dL (ref 0.2–1.2)
Total Protein: 6.6 g/dL (ref 6.0–8.3)

## 2018-06-02 LAB — LDL CHOLESTEROL, DIRECT: Direct LDL: 80 mg/dL

## 2018-06-02 MED ORDER — LISINOPRIL-HYDROCHLOROTHIAZIDE 10-12.5 MG PO TABS: 1.0000 | ORAL_TABLET | Freq: Every day | ORAL | 3 refills | Status: DC

## 2018-06-02 MED ORDER — INSULIN REGULAR HUMAN (CONC) 500 UNIT/ML ~~LOC~~ SOPN
PEN_INJECTOR | SUBCUTANEOUS | 11 refills | Status: DC
Start: 2018-06-02 — End: 2018-10-28

## 2018-06-02 NOTE — Patient Instructions (Addendum)
Please increase the insulin to the numbers listed below. blood tests are requested for you today.  We'll let you know about the results.  Please consider these measures for your health:  minimize alcohol.  Do not use tobacco products.  Have a colonoscopy at least every 10 years from age 50.  Keep firearms safely stored.  Always use seat belts.  have working smoke alarms in your home.  See an eye doctor and dentist regularly.  Never drive under the influence of alcohol or drugs (including prescription drugs).  Those with fair skin should take precautions against the sun, and should carefully examine their skin once per month, for any new or changed moles.   Please come back for a follow-up appointment in 3-4 months.      Bariatric Surgery You have so much to gain by losing weight.  You may have already tried every diet and exercise plan imaginable.  And, you may have sought advice from your family physician, too.   Sometimes, in spite of such diligent efforts, you may not be able to achieve long-term results by yourself.  In cases of severe obesity, bariatric or weight loss surgery is a proven method of achieving long-term weight control.  Our Services Our bariatric surgery programs offer our patients new hope and long-term weight-loss solution.  Since introducing our services in 2003, we have conducted more than 2,400 successful procedures.  Our program is designated as a Investment banker, corporate by the Metabolic and Bariatric Surgery Accreditation and Quality Improvement Program (MBSAQIP), a Child psychotherapist that sets rigorous patient safety and outcome standards.  Our program is also designated as a Engineer, manufacturing systems by [employer].   Our exceptional weight-loss surgery team specializes in diagnosis, treatment, follow-up care, and ongoing support for our patients with severe weight loss challenges.  We currently offer laparoscopic sleeve gastrectomy, gastric bypass, and  adjustable gastric band (LAP-BAND).    Attend our Bariatrics Seminar Choosing to undergo a bariatric procedure is a big decision, and one that should not be taken lightly.  You now have two options in how you learn about weight-loss surgery - in person or online.  Our objective is to ensure you have all of the information that you need to evaluate the advantages and obligations of this life changing procedure.  Please note that you are not alone in this process, and our experienced team is ready to assist and answer all of your questions.  There are several ways to register for a seminar (either on-line or in person): 1)  Call [phone] 2) Go on-line to [facility] and register for either type of seminar.  [email or web address]

## 2018-06-02 NOTE — Progress Notes (Signed)
Subjective:    Patient ID: Larry Rose, male    DOB: 1966-03-01, 52 y.o.   MRN: [id number]  HPI Pt is here for regular wellness examination, and is feeling pretty well in general, and says chronic med probs are stable, except as noted below Past Medical History:  Diagnosis Date  . ALCOHOLISM 11/30/2009  . ALLERGIC RHINITIS 04/01/2007  . CARPAL TUNNEL SYNDROME, RIGHT 10/06/2008  . DIABETES MELLITUS, TYPE II 04/01/2007  . Hepatic steatosis   . HYPERLIPIDEMIA, ATHEROGENIC 09/29/2007  . HYPERTENSION 04/01/2007  . Rosacea 05/14/2010  . TINEA CRURIS 11/07/2008    Past Surgical History:  Procedure Laterality Date  . carpel tunnel    . FRACTURE SURGERY      Social History   Socioeconomic History  . Marital status: Married    Spouse name: Not on file  . Number of children: Not on file  . Years of education: Not on file  . Highest education level: Not on file  Occupational History  . Occupation: Works Agricultural engineer  . Financial resource strain: Not on file  . Food insecurity:    Worry: Not on file    Inability: Not on file  . Transportation needs:    Medical: Not on file    Non-medical: Not on file  Tobacco Use  . Smoking status: Never Smoker  . Smokeless tobacco: Never Used  Substance and Sexual Activity  . Alcohol use: Not on file  . Drug use: Not on file  . Sexual activity: Not on file  Lifestyle  . Physical activity:    Days per week: Not on file    Minutes per session: Not on file  . Stress: Not on file  Relationships  . Social connections:    Talks on phone: Not on file    Gets together: Not on file    Attends religious service: Not on file    Active member of club or organization: Not on file    Attends meetings of clubs or organizations: Not on file    Relationship status: Not on file  . Intimate partner violence:    Fear of current or ex partner: Not on file    Emotionally abused: Not on file    Physically abused: Not on file     Forced sexual activity: Not on file  Other Topics Concern  . Not on file  Social History Narrative  . Not on file    Current Outpatient Medications on File Prior to Visit  Medication Sig Dispense Refill  . glucose blood (ONE TOUCH ULTRA TEST) test strip 1 each by Other route 2 (two) times daily. And lancets 2/day 250.01 100 each 12  . Multiple Vitamin (MULTIVITAMIN) tablet Take 1 tablet by mouth daily.      Marland Kitchen triamcinolone ointment (KENALOG) 0.5 % Apply 1 application topically 3 (three) times daily. As needed for skin nodule 30 g 2   Current Facility-Administered Medications on File Prior to Visit  Medication Dose Route Frequency Provider Last Rate Last Dose  . triamcinolone acetonide (KENALOG) 10 MG/ML injection 10 mg  10 mg Other Once Asencion Islam, DPM        Allergies  Allergen Reactions  . Enalapril Maleate   . Lovastatin     Family History  Problem Relation Age of Onset  . Cancer Neg Hx     BP 124/70 (BP Location: Left Arm, Patient Position: Sitting, Cuff Size: Large)   Pulse 70   Ht 5'  6" (1.676 m)   Wt (!) 323 lb 12.8 oz (146.9 kg)   SpO2 95%   BMI 52.26 kg/m     Review of Systems Denies fever, fatigue, visual loss, hearing loss, chest pain, sob, back pain, depression, cold intolerance, BRBPR, hematuria, syncope, numbness, allergy sxs, easy bruising, and rash.      Objective:   Physical Exam VS: see vs page GEN: no distress HEAD: head: no deformity eyes: no periorbital swelling, no proptosis external nose and ears are normal mouth: no lesion seen NECK: supple, thyroid is not enlarged CHEST WALL: no deformity LUNGS: clear to auscultation CV: reg rate and rhythm, no murmur ABD: abdomen is soft, nontender.  no hepatosplenomegaly.  not distended.  no hernia MUSCULOSKELETAL: muscle bulk and strength are grossly normal.  no obvious joint swelling.  gait is normal and steady PULSES: no carotid bruit NEURO:  cn 2-12 grossly intact.   readily moves all  4's.   SKIN:  Normal texture and temperature.  No rash or suspicious lesion is visible.   NODES:  None palpable at the neck PSYCH: alert, well-oriented.  Does not appear anxious nor depressed.    I personally reviewed electrocardiogram tracing (today):  Indication: wellness Impression: NSR.  Ant QS complexes  No hypertrophy. Compared to 2018: no significant change      Assessment & Plan:  Wellness visit today, with problems stable, except as noted.  SEPARATE EVALUATION FOLLOWS--EACH PROBLEM HERE IS NEW, NOT RESPONDING TO TREATMENT, OR POSES SIGNIFICANT RISK TO THE PATIENT'S HEALTH:  HISTORY OF THE PRESENT ILLNESS: Pt returns for f/u of diabetes mellitus:  DM type: Insulin-requiring type 2 Dx'ed: 1993 Complications: none Therapy: insulin since 2009.  DKA: never.  Severe hypoglycemia: never.  Pancreatitis: never.  Other: he takes multiple daily injections; he works rotating shifts; he declines weight-loss surgery.   Interval history:  No recent steroids. He has not recently checked cbg He says he never misses the insulin.  PAST MEDICAL HISTORY Past Medical History:  Diagnosis Date  . ALCOHOLISM 11/30/2009  . ALLERGIC RHINITIS 04/01/2007  . CARPAL TUNNEL SYNDROME, RIGHT 10/06/2008  . DIABETES MELLITUS, TYPE II 04/01/2007  . Hepatic steatosis   . HYPERLIPIDEMIA, ATHEROGENIC 09/29/2007  . HYPERTENSION 04/01/2007  . Rosacea 05/14/2010  . TINEA CRURIS 11/07/2008    Past Surgical History:  Procedure Laterality Date  . carpel tunnel    . FRACTURE SURGERY      Social History   Socioeconomic History  . Marital status: Married    Spouse name: Not on file  . Number of children: Not on file  . Years of education: Not on file  . Highest education level: Not on file  Occupational History  . Occupation: Works Agricultural engineer  . Financial resource strain: Not on file  . Food insecurity:    Worry: Not on file    Inability: Not on file  . Transportation  needs:    Medical: Not on file    Non-medical: Not on file  Tobacco Use  . Smoking status: Never Smoker  . Smokeless tobacco: Never Used  Substance and Sexual Activity  . Alcohol use: Not on file  . Drug use: Not on file  . Sexual activity: Not on file  Lifestyle  . Physical activity:    Days per week: Not on file    Minutes per session: Not on file  . Stress: Not on file  Relationships  . Social connections:    Talks on phone: Not  on file    Gets together: Not on file    Attends religious service: Not on file    Active member of club or organization: Not on file    Attends meetings of clubs or organizations: Not on file    Relationship status: Not on file  . Intimate partner violence:    Fear of current or ex partner: Not on file    Emotionally abused: Not on file    Physically abused: Not on file    Forced sexual activity: Not on file  Other Topics Concern  . Not on file  Social History Narrative  . Not on file    Current Outpatient Medications on File Prior to Visit  Medication Sig Dispense Refill  . glucose blood (ONE TOUCH ULTRA TEST) test strip 1 each by Other route 2 (two) times daily. And lancets 2/day 250.01 100 each 12  . Multiple Vitamin (MULTIVITAMIN) tablet Take 1 tablet by mouth daily.      Marland Kitchen triamcinolone ointment (KENALOG) 0.5 % Apply 1 application topically 3 (three) times daily. As needed for skin nodule 30 g 2   Current Facility-Administered Medications on File Prior to Visit  Medication Dose Route Frequency Provider Last Rate Last Dose  . triamcinolone acetonide (KENALOG) 10 MG/ML injection 10 mg  10 mg Other Once Asencion Islam, DPM        Allergies  Allergen Reactions  . Enalapril Maleate   . Lovastatin     Family History  Problem Relation Age of Onset  . Cancer Neg Hx     BP 124/70 (BP Location: Left Arm, Patient Position: Sitting, Cuff Size: Large)   Pulse 70   Ht 5\' 6"  (1.676 m)   Wt (!) 323 lb 12.8 oz (146.9 kg)   SpO2 95%   BMI  52.26 kg/m   REVIEW OF SYSTEMS: He denies hypoglycemia.  He has gained a few lbs.   PHYSICAL EXAMINATION: VITAL SIGNS:  See vs page GENERAL: no distress Pulses: foot pulses are intact bilaterally.   MSK: no deformity of the feet or ankles.  CV: 1+ bilat edema of the legs.   Skin:  no ulcer on the feet or ankles.  normal color and temp on the feet and ankles Neuro: sensation is intact to touch on the feet and ankles. LAB/XRAY RESULTS: Lab Results  Component Value Date   HGBA1C 7.5 (A) 06/02/2018  IMPRESSION: Insulin-requiring type 2 DM: worse.   Obesity: worse Edema: this limits rx options PLAN:  Increase insulin I again advised bariatric surgery

## 2018-07-02 ENCOUNTER — Other Ambulatory Visit: Payer: Self-pay | Admitting: Endocrinology

## 2018-08-14 ENCOUNTER — Other Ambulatory Visit: Payer: Self-pay | Admitting: Endocrinology

## 2018-08-25 ENCOUNTER — Ambulatory Visit: Payer: Commercial Managed Care - PPO | Admitting: Family Medicine

## 2018-08-25 ENCOUNTER — Encounter: Payer: Self-pay | Admitting: Family Medicine

## 2018-08-25 VITALS — BP 128/72 | HR 84 | Temp 98.0°F | Ht 66.0 in | Wt 329.0 lb

## 2018-08-25 DIAGNOSIS — I10 Essential (primary) hypertension: Secondary | ICD-10-CM

## 2018-08-25 DIAGNOSIS — E119 Type 2 diabetes mellitus without complications: Secondary | ICD-10-CM

## 2018-08-25 DIAGNOSIS — M109 Gout, unspecified: Secondary | ICD-10-CM | POA: Diagnosis not present

## 2018-08-25 MED ORDER — COLCHICINE 0.6 MG PO CAPS: ORAL_CAPSULE | ORAL | 0 refills | Status: DC

## 2018-08-25 NOTE — Assessment & Plan Note (Signed)
-  Start colchicine  -Reviewed dietary changes, limiting EtOH intake.

## 2018-08-25 NOTE — Progress Notes (Signed)
Larry Rose - 53 y.o. male MRN [id number]  Date of birth: 05-07-1966  Subjective Chief Complaint  Patient presents with  . Establish Care    has been having a gout flare up-right toe swollen    HPI Larry Rose is a 53 y.o. male with history of HTN, T2DM, and gout here today to establish with pcp and complaint of gout flare.    -Gout:  Reports swelling and redness to R great toe that started yesterday. Similar to previous gout flares.   Has had injection done by podiatry in the past which has helped.    -T2DM:  He has a history of poorly controlled diabetes and is using U-500 concentrated insulin due to high insulin requirements.  He has been followed by endocrinology.  He has had some issues with intermittent hypoglycemia in the evenings at work but cutting back some on supper dosing has improved this.  He is up to date on pneumonia vaccine.  He declines flu vaccine.   -HTN:  Current treatment with lisinopril-HCTZ.  Doing well with current medication, denies symptoms of hypotension.  He denies chest pain, shortness of breath, palpitations, headache or vision changes.   He is due for colon cancer screening, prefers cologuard.  Denies family history of colon cancer.   ROS:  A comprehensive ROS was completed and negative except as noted per HPI  Allergies  Allergen Reactions  . Enalapril Maleate   . Lovastatin     Past Medical History:  Diagnosis Date  . ALCOHOLISM 11/30/2009  . ALLERGIC RHINITIS 04/01/2007  . CARPAL TUNNEL SYNDROME, RIGHT 10/06/2008  . DIABETES MELLITUS, TYPE II 04/01/2007  . Hepatic steatosis   . HYPERLIPIDEMIA, ATHEROGENIC 09/29/2007  . HYPERTENSION 04/01/2007  . Rosacea 05/14/2010  . TINEA CRURIS 11/07/2008    Past Surgical History:  Procedure Laterality Date  . carpel tunnel    . FRACTURE SURGERY      Social History   Socioeconomic History  . Marital status: Married    Spouse name: Not on file  . Number of children: Not on file  . Years  of education: Not on file  . Highest education level: Not on file  Occupational History  . Occupation: Works Agricultural engineer  . Financial resource strain: Not on file  . Food insecurity:    Worry: Not on file    Inability: Not on file  . Transportation needs:    Medical: Not on file    Non-medical: Not on file  Tobacco Use  . Smoking status: Never Smoker  . Smokeless tobacco: Never Used  Substance and Sexual Activity  . Alcohol use: Not on file  . Drug use: Not on file  . Sexual activity: Not on file  Lifestyle  . Physical activity:    Days per week: Not on file    Minutes per session: Not on file  . Stress: Not on file  Relationships  . Social connections:    Talks on phone: Not on file    Gets together: Not on file    Attends religious service: Not on file    Active member of club or organization: Not on file    Attends meetings of clubs or organizations: Not on file    Relationship status: Not on file  Other Topics Concern  . Not on file  Social History Narrative  . Not on file    Family History  Problem Relation Age of Onset  . Cancer Neg Hx  Health Maintenance  Topic Date Due  . OPHTHALMOLOGY EXAM  12/27/2017  . COLONOSCOPY  08/09/2018  . INFLUENZA VACCINE  11/17/2018 (Originally 03/19/2018)  . HEMOGLOBIN A1C  12/02/2018  . FOOT EXAM  03/31/2019  . TETANUS/TDAP  04/24/2026  . PNEUMOCOCCAL POLYSACCHARIDE VACCINE AGE 46-64 HIGH RISK  Completed  . HIV Screening  Completed    ----------------------------------------------------------------------------------------------------------------------------------------------------------------------------------------------------------------- Physical Exam BP 128/72   Pulse 84   Temp 98 F (36.7 C) (Oral)   Ht 5\' 6"  (1.676 m)   Wt (!) 329 lb (149.2 kg)   SpO2 97%   BMI 53.10 kg/m   Physical Exam Constitutional:      General: He is not in acute distress.    Appearance: Normal  appearance.  HENT:     Head: Normocephalic and atraumatic.     Right Ear: Tympanic membrane normal.     Left Ear: Tympanic membrane normal.     Mouth/Throat:     Mouth: Mucous membranes are moist.  Eyes:     General: No scleral icterus. Neck:     Musculoskeletal: Neck supple.  Cardiovascular:     Rate and Rhythm: Normal rate and regular rhythm.  Pulmonary:     Effort: Pulmonary effort is normal.     Breath sounds: Normal breath sounds.  Musculoskeletal:     Comments: R great toe with swelling and erythema at MTP joint.   Lymphadenopathy:     Cervical: No cervical adenopathy.  Skin:    General: Skin is warm and dry.     Findings: No rash.  Neurological:     General: No focal deficit present.     Mental Status: He is alert.  Psychiatric:        Mood and Affect: Mood normal.        Behavior: Behavior normal.     ------------------------------------------------------------------------------------------------------------------------------------------------------------------------------------------------------------------- Assessment and Plan  Gout -Start colchicine  -Reviewed dietary changes, limiting EtOH intake.   Diabetes Lab Results  Component Value Date   HGBA1C 7.5 (A) 06/02/2018  Currently fairly well controlled -Discussed I preferred that he continue to see Dr. Everardo All for mgmt of diabetes due to high insulin requirement and intermittent hypoglycemic episodes.   Essential hypertension -BP well controlled, continue current medication.  -Follow low salt diet.

## 2018-08-25 NOTE — Assessment & Plan Note (Signed)
Lab Results  Component Value Date   HGBA1C 7.5 (A) 06/02/2018  Currently fairly well controlled -Discussed I preferred that he continue to see Dr. Everardo AllEllison for mgmt of diabetes due to high insulin requirement and intermittent hypoglycemic episodes.

## 2018-08-25 NOTE — Assessment & Plan Note (Signed)
-  BP well controlled, continue current medication -Follow low salt diet 

## 2018-08-25 NOTE — Patient Instructions (Signed)
I would rather you continue to see Dr. Everardo All for management of your diabetes.  I can manage your blood pressure/gout/etc.    Gout  Gout is painful swelling of your joints. Gout is a type of arthritis. It is caused by having too much uric acid in your body. Uric acid is a chemical that is made when your body breaks down substances called purines. If your body has too much uric acid, sharp crystals can form and build up in your joints. This causes pain and swelling. Gout attacks can happen quickly and be very painful (acute gout). Over time, the attacks can affect more joints and happen more often (chronic gout). What are the causes?  Too much uric acid in your blood. This can happen because: ? Your kidneys do not remove enough uric acid from your blood. ? Your body makes too much uric acid. ? You eat too many foods that are high in purines. These foods include organ meats, some seafood, and beer.  Trauma or stress. What increases the risk?  Having a family history of gout.  Being male and middle-aged.  Being male and having gone through menopause.  Being very overweight (obese).  Drinking alcohol, especially beer.  Not having enough water in the body (being dehydrated).  Losing weight too quickly.  Having an organ transplant.  Having lead poisoning.  Taking certain medicines.  Having kidney disease.  Having a skin condition called psoriasis. What are the signs or symptoms? An attack of acute gout usually happens in just one joint. The most common place is the big toe. Attacks often start at night. Other joints that may be affected include joints of the feet, ankle, knee, fingers, wrist, or elbow. Symptoms of an attack may include:  Very bad pain.  Warmth.  Swelling.  Stiffness.  Shiny, red, or purple skin.  Tenderness. The affected joint may be very painful to touch.  Chills and fever. Chronic gout may cause symptoms more often. More joints may be involved.  You may also have white or yellow lumps (tophi) on your hands or feet or in other areas near your joints. How is this treated?  Treatment for this condition has two phases: treating an acute attack and preventing future attacks.  Acute gout treatment may include: ? NSAIDs. ? Steroids. These are taken by mouth or injected into a joint. ? Colchicine. This medicine relieves pain and swelling. It can be given by mouth or through an IV tube.  Preventive treatment may include: ? Taking small doses of NSAIDs or colchicine daily. ? Using a medicine that reduces uric acid levels in your blood. ? Making changes to your diet. You may need to see a food expert (dietitian) about what to eat and drink to prevent gout. Follow these instructions at home: During a gout attack   If told, put ice on the painful area: ? Put ice in a plastic bag. ? Place a towel between your skin and the bag. ? Leave the ice on for 20 minutes, 2-3 times a day.  Raise (elevate) the painful joint above the level of your heart as often as you can.  Rest the joint as much as possible. If the joint is in your leg, you may be given crutches.  Follow instructions from your doctor about what you cannot eat or drink. Avoiding future gout attacks  Eat a low-purine diet. Avoid foods and drinks such as: ? Liver. ? Kidney. ? Anchovies. ? Asparagus. ? Herring. ? Mushrooms. ?  Mussels. ? Beer.  Stay at a healthy weight. If you want to lose weight, talk with your doctor. Do not lose weight too fast.  Start or continue an exercise plan as told by your doctor. Eating and drinking  Drink enough fluids to keep your pee (urine) pale yellow.  If you drink alcohol: ? Limit how much you use to:  0-1 drink a day for women.  0-2 drinks a day for men. ? Be aware of how much alcohol is in your drink. In the [place] one drink equals one 12 oz bottle of beer (355 mL), one 5 oz glass of wine (148 mL), or one 1 oz glass of hard liquor  (44 mL). General instructions  Take over-the-counter and prescription medicines only as told by your doctor.  Do not drive or use heavy machinery while taking prescription pain medicine.  Return to your normal activities as told by your doctor. Ask your doctor what activities are safe for you.  Keep all follow-up visits as told by your doctor. This is important. Contact a doctor if:  You have another gout attack.  You still have symptoms of a gout attack after 10 days of treatment.  You have problems (side effects) because of your medicines.  You have chills or a fever.  You have burning pain when you pee (urinate).  You have pain in your lower back or belly. Get help right away if:  You have very bad pain.  Your pain cannot be controlled.  You cannot pee. Summary  Gout is painful swelling of the joints.  The most common site of pain is the big toe, but it can affect other joints.  Medicines and avoiding some foods can help to prevent and treat gout attacks. This information is not intended to replace advice given to you by your health care provider. Make sure you discuss any questions you have with your health care provider. Document Released: 05/14/2008 Document Revised: 02/25/2018 Document Reviewed: 02/25/2018 Elsevier Interactive Patient Education  2019 [employer].

## 2018-08-27 ENCOUNTER — Ambulatory Visit: Payer: Self-pay | Admitting: *Deleted

## 2018-08-27 NOTE — Telephone Encounter (Signed)
Would recommend continuation of colchicine for now as steroid injection may elevate blood glucose.

## 2018-08-27 NOTE — Telephone Encounter (Signed)
Patient notified-verbalized understanding. Will continue Colchicine and notify the office if symptoms worsen.

## 2018-08-27 NOTE — Telephone Encounter (Signed)
Pt seen by Dr. Ashley Royalty 08/25/2018 for gout flare up of right great toe.  States swelling resolved, presently just mild redness but pain continues; 10/10 to touch.  Pt states has applied ice which "Helps a little." States can not bend toe. Reports he has been on it more than usual as his mother has been ill and he has been assisting with her care. Taking Colchicine as ordered. Pt questioning steroid injection. States he had one before from Dr. Everardo All; which was effective.  Please advise: [phone]  Reason for Disposition . [1] Caller requesting NON-URGENT health information AND [2] PCP's office is the best resource  Answer Assessment - Initial Assessment Questions 1. REASON FOR CALL or QUESTION: "What is your reason for calling today?" or "How can I best help you?" or "What question do you have that I can help answer?"     Gout painful, seen 08/25/2018  Protocols used: INFORMATION ONLY CALL-A-AH

## 2018-08-30 ENCOUNTER — Encounter: Payer: Self-pay | Admitting: Family Medicine

## 2018-08-30 DIAGNOSIS — Z1212 Encounter for screening for malignant neoplasm of rectum: Secondary | ICD-10-CM | POA: Diagnosis not present

## 2018-08-30 DIAGNOSIS — Z1211 Encounter for screening for malignant neoplasm of colon: Secondary | ICD-10-CM | POA: Diagnosis not present

## 2018-09-03 LAB — COLOGUARD

## 2018-09-15 ENCOUNTER — Telehealth: Payer: Self-pay | Admitting: Endocrinology

## 2018-09-15 NOTE — Telephone Encounter (Signed)
Please advise and let me know if this is something you would like to complete for pt

## 2018-09-15 NOTE — Telephone Encounter (Signed)
lft vm informing pt that usually a PCP will fill out this paperwork unless it is specifically the endocrinology section

## 2018-09-15 NOTE — Telephone Encounter (Signed)
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 Please either call patient at ph# [phone] or send patient a message on MyChart

## 2018-09-21 ENCOUNTER — Encounter: Payer: Self-pay | Admitting: Endocrinology

## 2018-09-21 ENCOUNTER — Ambulatory Visit: Payer: Commercial Managed Care - PPO | Admitting: Endocrinology

## 2018-09-21 VITALS — BP 112/60 | HR 77 | Ht 66.0 in | Wt 329.2 lb

## 2018-09-21 DIAGNOSIS — E119 Type 2 diabetes mellitus without complications: Secondary | ICD-10-CM | POA: Diagnosis not present

## 2018-09-21 LAB — POCT GLYCOSYLATED HEMOGLOBIN (HGB A1C): Hemoglobin A1C: 8.8 % — AB (ref 4.0–5.6)

## 2018-09-21 MED ORDER — LISINOPRIL 20 MG PO TABS: 20.0000 mg | ORAL_TABLET | Freq: Every day | ORAL | 0 refills | Status: DC

## 2018-09-21 MED ORDER — FUROSEMIDE 20 MG PO TABS: 20.0000 mg | ORAL_TABLET | Freq: Every day | ORAL | 0 refills | Status: DC

## 2018-09-21 NOTE — Progress Notes (Signed)
Subjective:    Patient ID: Larry Rose, male    DOB: 19-Nov-1965, 53 y.o.   MRN: [id number]  HPI Pt returns for f/u of diabetes mellitus:  DM type: Insulin-requiring type 2 Dx'ed: 1993 Complications: none Therapy: insulin since 2009.  DKA: never.  Severe hypoglycemia: never.  Pancreatitis: never.  Other: he takes multiple daily injections; he works rotating shifts; he declines weight-loss surgery.   Interval history:  No recent steroids. He has not recently checked cbg.  He takes less than the rx'ed dosage.  He says he never misses the insulin.   Past Medical History:  Diagnosis Date  . ALCOHOLISM 11/30/2009  . ALLERGIC RHINITIS 04/01/2007  . CARPAL TUNNEL SYNDROME, RIGHT 10/06/2008  . DIABETES MELLITUS, TYPE II 04/01/2007  . Hepatic steatosis   . HYPERLIPIDEMIA, ATHEROGENIC 09/29/2007  . HYPERTENSION 04/01/2007  . Rosacea 05/14/2010  . TINEA CRURIS 11/07/2008    Past Surgical History:  Procedure Laterality Date  . carpel tunnel    . FRACTURE SURGERY      Social History   Socioeconomic History  . Marital status: Married    Spouse name: Not on file  . Number of children: Not on file  . Years of education: Not on file  . Highest education level: Not on file  Occupational History  . Occupation: Works Agricultural engineer  . Financial resource strain: Not on file  . Food insecurity:    Worry: Not on file    Inability: Not on file  . Transportation needs:    Medical: Not on file    Non-medical: Not on file  Tobacco Use  . Smoking status: Never Smoker  . Smokeless tobacco: Never Used  Substance and Sexual Activity  . Alcohol use: Not on file  . Drug use: Not on file  . Sexual activity: Not on file  Lifestyle  . Physical activity:    Days per week: Not on file    Minutes per session: Not on file  . Stress: Not on file  Relationships  . Social connections:    Talks on phone: Not on file    Gets together: Not on file    Attends religious  service: Not on file    Active member of club or organization: Not on file    Attends meetings of clubs or organizations: Not on file    Relationship status: Not on file  . Intimate partner violence:    Fear of current or ex partner: Not on file    Emotionally abused: Not on file    Physically abused: Not on file    Forced sexual activity: Not on file  Other Topics Concern  . Not on file  Social History Narrative  . Not on file    Current Outpatient Medications on File Prior to Visit  Medication Sig Dispense Refill  . Colchicine (MITIGARE) 0.6 MG CAPS Take 1.2mg  PO followed by 0.6mg  1 hour later.  Continue 0.6mg  daily until resolution. 30 capsule 0  . glucose blood (ONE TOUCH ULTRA TEST) test strip 1 each by Other route 2 (two) times daily. And lancets 2/day 250.01 100 each 12  . insulin regular human CONCENTRATED (HUMULIN R U-500 KWIKPEN) 500 UNIT/ML kwikpen 3 times a day (just before each meal) 230-170-240 units, and pen needles 1/day 14 pen 11  . Multiple Vitamin (MULTIVITAMIN) tablet Take 1 tablet by mouth daily.       Current Facility-Administered Medications on File Prior to Visit  Medication Dose  Route Frequency Provider Last Rate Last Dose  . triamcinolone acetonide (KENALOG) 10 MG/ML injection 10 mg  10 mg Other Once Asencion IslamStover, Titorya, DPM        Allergies  Allergen Reactions  . Enalapril Maleate   . Lovastatin     Family History  Problem Relation Age of Onset  . Cancer Neg Hx     BP 112/60 (BP Location: Left Arm, Patient Position: Sitting, Cuff Size: Large)   Pulse 77   Ht 5\' 6"  (1.676 m)   Wt (!) 329 lb 3.2 oz (149.3 kg)   SpO2 92%   BMI 53.13 kg/m    Review of Systems He has pain at both MTP's.  Colchicine helped only temporarily.  He denies hypoglycemia.      Objective:   Physical Exam VITAL SIGNS:  See vs page GENERAL: no distress Pulses: foot pulses are intact bilaterally.   MSK: no deformity of the feet or ankles.  CV: 2+ bilat edema of the legs.    Skin:  no ulcer on the feet or ankles.  normal color and temp on the feet and ankles Neuro: sensation is intact to touch on the feet and ankles.   Ext: both MTP areas are swollen.    Lab Results  Component Value Date   HGBA1C 8.8 (A) 09/21/2018       Assessment & Plan:  Insulin-requiring type 2 DM: worse Gout: he will prob benefit from d/c HCTZ. HTN: well-controlled   Patient Instructions  Please increase the insulin to the numbers listed below. check your blood sugar twice a day.  vary the time of day when you check, between before the 3 meals, and at bedtime.  also check if you have symptoms of your blood sugar being too high or too low.  please keep a record of the readings and bring it to your next appointment here (or you can bring the meter itself).  You can write it on any piece of paper.  please call us sooner if your blood sugar goes below 70, or if you have a lot of readings over 200. I have sent 2 prescriptions to your pharmacy: to change the lisinopril-HCTZ to 2 different meds.  This will help the gout. Please see Dr Ashley RoyaltyMatthews soon, to follow up the gout and blood pressure.   Please come back here for a follow-up appointment in 2 months.

## 2018-09-21 NOTE — Patient Instructions (Addendum)
Please increase the insulin to the numbers listed below. check your blood sugar twice a day.  vary the time of day when you check, between before the 3 meals, and at bedtime.  also check if you have symptoms of your blood sugar being too high or too low.  please keep a record of the readings and bring it to your next appointment here (or you can bring the meter itself).  You can write it on any piece of paper.  please call us sooner if your blood sugar goes below 70, or if you have a lot of readings over 200. I have sent 2 prescriptions to your pharmacy: to change the lisinopril-HCTZ to 2 different meds.  This will help the gout. Please see Dr Ashley Royalty soon, to follow up the gout and blood pressure.   Please come back here for a follow-up appointment in 2 months.

## 2018-10-28 ENCOUNTER — Other Ambulatory Visit: Payer: Self-pay | Admitting: Endocrinology

## 2018-11-18 ENCOUNTER — Other Ambulatory Visit: Payer: Self-pay

## 2018-11-18 ENCOUNTER — Encounter: Payer: Self-pay | Admitting: Endocrinology

## 2018-11-18 ENCOUNTER — Ambulatory Visit: Payer: Commercial Managed Care - PPO | Admitting: Endocrinology

## 2018-11-18 VITALS — BP 118/64 | HR 83 | Temp 98.6°F | Ht 66.0 in | Wt 324.8 lb

## 2018-11-18 DIAGNOSIS — Z794 Long term (current) use of insulin: Secondary | ICD-10-CM | POA: Diagnosis not present

## 2018-11-18 DIAGNOSIS — Z9119 Patient's noncompliance with other medical treatment and regimen: Secondary | ICD-10-CM | POA: Diagnosis not present

## 2018-11-18 DIAGNOSIS — E119 Type 2 diabetes mellitus without complications: Secondary | ICD-10-CM

## 2018-11-18 DIAGNOSIS — I10 Essential (primary) hypertension: Secondary | ICD-10-CM

## 2018-11-18 LAB — POCT GLYCOSYLATED HEMOGLOBIN (HGB A1C): Hemoglobin A1C: 8.2 % — AB (ref 4.0–5.6)

## 2018-11-18 MED ORDER — INSULIN REGULAR HUMAN (CONC) 500 UNIT/ML ~~LOC~~ SOPN: 220.0000 [IU] | PEN_INJECTOR | Freq: Three times a day (TID) | SUBCUTANEOUS | 6 refills | Status: DC

## 2018-11-18 NOTE — Progress Notes (Signed)
Subjective:    Patient ID: Larry Rose, male    DOB: 09/12/1965, 53 y.o.   MRN: [id number]  HPI Pt returns for f/u of diabetes mellitus:  DM type: Insulin-requiring type 2 Dx'ed: 1993 Complications: none Therapy: insulin since 2009.  DKA: never.  Severe hypoglycemia: never.  Pancreatitis: never.  Other: he takes multiple daily injections; he works rotating shifts; he declines weight-loss surgery.   Interval history:  He has not recently worked, and he is concerned wife will be laid off soon.  Pt says he does not check cbg's, but he says he never misses the insulin doses.   Past Medical History:  Diagnosis Date  . ALCOHOLISM 11/30/2009  . ALLERGIC RHINITIS 04/01/2007  . CARPAL TUNNEL SYNDROME, RIGHT 10/06/2008  . DIABETES MELLITUS, TYPE II 04/01/2007  . Hepatic steatosis   . HYPERLIPIDEMIA, ATHEROGENIC 09/29/2007  . HYPERTENSION 04/01/2007  . Rosacea 05/14/2010  . TINEA CRURIS 11/07/2008    Past Surgical History:  Procedure Laterality Date  . carpel tunnel    . FRACTURE SURGERY      Social History   Socioeconomic History  . Marital status: Married    Spouse name: Not on file  . Number of children: Not on file  . Years of education: Not on file  . Highest education level: Not on file  Occupational History  . Occupation: Works Agricultural engineer  . Financial resource strain: Not on file  . Food insecurity:    Worry: Not on file    Inability: Not on file  . Transportation needs:    Medical: Not on file    Non-medical: Not on file  Tobacco Use  . Smoking status: Never Smoker  . Smokeless tobacco: Never Used  Substance and Sexual Activity  . Alcohol use: Not on file  . Drug use: Not on file  . Sexual activity: Not on file  Lifestyle  . Physical activity:    Days per week: Not on file    Minutes per session: Not on file  . Stress: Not on file  Relationships  . Social connections:    Talks on phone: Not on file    Gets together: Not on  file    Attends religious service: Not on file    Active member of club or organization: Not on file    Attends meetings of clubs or organizations: Not on file    Relationship status: Not on file  . Intimate partner violence:    Fear of current or ex partner: Not on file    Emotionally abused: Not on file    Physically abused: Not on file    Forced sexual activity: Not on file  Other Topics Concern  . Not on file  Social History Narrative  . Not on file    Current Outpatient Medications on File Prior to Visit  Medication Sig Dispense Refill  . Colchicine (MITIGARE) 0.6 MG CAPS Take 1.2mg  PO followed by 0.6mg  1 hour later.  Continue 0.6mg  daily until resolution. 30 capsule 0  . furosemide (LASIX) 20 MG tablet Take 1 tablet (20 mg total) by mouth daily. 90 tablet 0  . glucose blood (ONE TOUCH ULTRA TEST) test strip 1 each by Other route 2 (two) times daily. And lancets 2/day 250.01 100 each 12  . lisinopril (PRINIVIL,ZESTRIL) 20 MG tablet Take 1 tablet (20 mg total) by mouth daily. 90 tablet 0  . Multiple Vitamin (MULTIVITAMIN) tablet Take 1 tablet by mouth daily.  Current Facility-Administered Medications on File Prior to Visit  Medication Dose Route Frequency Provider Last Rate Last Dose  . triamcinolone acetonide (KENALOG) 10 MG/ML injection 10 mg  10 mg Other Once Asencion Islam, DPM        Allergies  Allergen Reactions  . Enalapril Maleate   . Lovastatin     Family History  Problem Relation Age of Onset  . Cancer Neg Hx     BP 118/64 (BP Location: Right Arm, Patient Position: Sitting, Cuff Size: Large)   Pulse 83   Temp 98.6 F (37 C)   Ht 5\' 6"  (1.676 m)   Wt (!) 324 lb 12.8 oz (147.3 kg)   SpO2 97%   BMI 52.42 kg/m   Review of Systems No recent gout sxs.      Objective:   Physical Exam VITAL SIGNS:  See vs page GENERAL: no distress Pulses: dorsalis pedis intact bilat.   MSK: no deformity of the feet CV: 1+ bilat leg edema Skin:  no ulcer on the  feet.  normal color and temp on the feet. Neuro: sensation is intact to touch on the feet Ext: left great toenail is ingrown, but no erythema/swell/drainage.   A1c=8.2%     Assessment & Plan:  Insulin-requiring type 2 DM: he needs increased rx.  HTN: well-controlled.  Noncompliance with cbg recording: the best we can do is to increase each of his TID doses.    Patient Instructions  Please increase the insulin to 220 units 3 times a day (just before each meal).   check your blood sugar twice a day.  vary the time of day when you check, between before the 3 meals, and at bedtime.  also check if you have symptoms of your blood sugar being too high or too low.  please keep a record of the readings and bring it to your next appointment here (or you can bring the meter itself).  You can write it on any piece of paper.  please call us sooner if your blood sugar goes below 70, or if you have a lot of readings over 200.  Please come back here for a follow-up appointment in 2 months.   Please continue the same medication for your blood pressure.

## 2018-11-18 NOTE — Patient Instructions (Addendum)
Please increase the insulin to 220 units 3 times a day (just before each meal).   check your blood sugar twice a day.  vary the time of day when you check, between before the 3 meals, and at bedtime.  also check if you have symptoms of your blood sugar being too high or too low.  please keep a record of the readings and bring it to your next appointment here (or you can bring the meter itself).  You can write it on any piece of paper.  please call us sooner if your blood sugar goes below 70, or if you have a lot of readings over 200.  Please come back here for a follow-up appointment in 2 months.   Please continue the same medication for your blood pressure.

## 2018-11-20 ENCOUNTER — Ambulatory Visit: Payer: Commercial Managed Care - PPO | Admitting: Endocrinology

## 2018-11-25 ENCOUNTER — Encounter: Payer: Self-pay | Admitting: Family Medicine

## 2018-11-25 ENCOUNTER — Ambulatory Visit (INDEPENDENT_AMBULATORY_CARE_PROVIDER_SITE_OTHER): Payer: Commercial Managed Care - PPO | Admitting: Family Medicine

## 2018-11-25 VITALS — BP 118/64 | HR 83 | Temp 98.6°F | Ht 66.0 in | Wt 324.0 lb

## 2018-11-25 DIAGNOSIS — R400 Somnolence: Secondary | ICD-10-CM | POA: Diagnosis not present

## 2018-11-25 DIAGNOSIS — F5101 Primary insomnia: Secondary | ICD-10-CM | POA: Insufficient documentation

## 2018-11-25 DIAGNOSIS — R0683 Snoring: Secondary | ICD-10-CM | POA: Diagnosis not present

## 2018-11-25 MED ORDER — TRAZODONE HCL 50 MG PO TABS: 50.0000 mg | ORAL_TABLET | Freq: Every evening | ORAL | 0 refills | Status: DC | PRN

## 2018-11-25 NOTE — Assessment & Plan Note (Addendum)
-  New problem -Discussed with him with snoring and daytime fatigue along with his BMI that we should evaluate for OSA -Will give him a trial of trazodone 50-100 at night as he also seems to have some anxiety contributing to sleep onset.  -We'll plan to f/u in 1 month to review medication.

## 2018-11-25 NOTE — Progress Notes (Signed)
Larry FriezeBrian S Rose - 53 y.o. male MRN [id number]  Date of birth: 08/11/1966   This visit type was conducted due to national recommendations for restrictions regarding the COVID-19 Pandemic (e.g. social distancing).  This format is felt to be most appropriate for this patient at this time.  All issues noted in this document were discussed and addressed.  No physical exam was performed (except for noted visual exam findings with Video Visits).  I discussed the limitations of evaluation and management by telemedicine and the availability of in person appointments. The patient expressed understanding and agreed to proceed.  I connected with@ on 11/25/18 at  9:00 AM EDT by a video enabled telemedicine application and verified that I am speaking with the correct person using two identifiers.   Patient Location: Home [address] [place] [id number]   Provider location:   Home Office  Chief Complaint  Patient presents with  . Insomnia    onset : episodic 6-12 months    HPI  Larry FerrariBrian S Rose is a 53 y.o. male who presents via audio/video conferencing for a telehealth visit today.  He has concern today about insomnia.  This has been an ongoing problem for him for about the past year.  He reports difficulty falling asleep and staying asleep.  He does endorse feeling very tired throughout the day and will catch himself dozing off in the evenings.  He has been told by his wife that he snores heavily.  He does admit to some anxiety as well which contributes to his difficulty falling asleep.  He has not tried anything for treatment for this and he has never been evaluated for OSA.    ROS:  A comprehensive ROS was completed and negative except as noted per HPI  Past Medical History:  Diagnosis Date  . ALCOHOLISM 11/30/2009  . ALLERGIC RHINITIS 04/01/2007  . CARPAL TUNNEL SYNDROME, RIGHT 10/06/2008  . DIABETES MELLITUS, TYPE II 04/01/2007  . Hepatic steatosis   . HYPERLIPIDEMIA, ATHEROGENIC  09/29/2007  . HYPERTENSION 04/01/2007  . Rosacea 05/14/2010  . TINEA CRURIS 11/07/2008    Past Surgical History:  Procedure Laterality Date  . carpel tunnel    . FRACTURE SURGERY      Family History  Problem Relation Age of Onset  . Cancer Neg Hx     Social History   Socioeconomic History  . Marital status: Married    Spouse name: Not on file  . Number of children: Not on file  . Years of education: Not on file  . Highest education level: Not on file  Occupational History  . Occupation: Works Agricultural [employer]  Social Needs  . Financial resource strain: Not on file  . Food insecurity:    Worry: Not on file    Inability: Not on file  . Transportation needs:    Medical: Not on file    Non-medical: Not on file  Tobacco Use  . Smoking status: Never Smoker  . Smokeless tobacco: Never Used  Substance and Sexual Activity  . Alcohol use: Not on file  . Drug use: Not on file  . Sexual activity: Not on file  Lifestyle  . Physical activity:    Days per week: Not on file    Minutes per session: Not on file  . Stress: Not on file  Relationships  . Social connections:    Talks on phone: Not on file    Gets together: Not on file    Attends religious service: Not  on file    Active member of club or organization: Not on file    Attends meetings of clubs or organizations: Not on file    Relationship status: Not on file  . Intimate partner violence:    Fear of current or ex partner: Not on file    Emotionally abused: Not on file    Physically abused: Not on file    Forced sexual activity: Not on file  Other Topics Concern  . Not on file  Social History Narrative  . Not on file     Current Outpatient Medications:  .  Colchicine (MITIGARE) 0.6 MG CAPS, Take 1.2mg  PO followed by 0.6mg  1 hour later.  Continue 0.6mg  daily until resolution., Disp: 30 capsule, Rfl: 0 .  furosemide (LASIX) 20 MG tablet, Take 1 tablet (20 mg total) by mouth daily., Disp: 90 tablet, Rfl: 0  .  glucose blood (ONE TOUCH ULTRA TEST) test strip, 1 each by Other route 2 (two) times daily. And lancets 2/day 250.01, Disp: 100 each, Rfl: 12 .  insulin regular human CONCENTRATED (HUMULIN R) 500 UNIT/ML kwikpen, Inject 220 Units into the skin 3 (three) times daily with meals., Disp: 38 pen, Rfl: 6 .  lisinopril (PRINIVIL,ZESTRIL) 20 MG tablet, Take 1 tablet (20 mg total) by mouth daily., Disp: 90 tablet, Rfl: 0 .  Multiple Vitamin (MULTIVITAMIN) tablet, Take 1 tablet by mouth daily.  , Disp: , Rfl:   Current Facility-Administered Medications:  .  triamcinolone acetonide (KENALOG) 10 MG/ML injection 10 mg, 10 mg, Other, Once, Stover, Nicholson, DPM  EXAM:  VITALS per patient if applicable: BP 118/64   Pulse 83   Temp 98.6 F (37 C) (Oral)   Ht 5\' 6"  (1.676 m)   Wt (!) 324 lb (147 kg)   SpO2 97%   BMI 52.29 kg/m   GENERAL: Obese, alert, oriented, appears well and in no acute distress  HEENT: atraumatic, conjunttiva clear, no obvious abnormalities on inspection of external nose and ears  NECK: normal movements of the head and neck  LUNGS: on inspection no signs of respiratory distress, breathing rate appears normal, no obvious gross SOB, gasping or wheezing  CV: no obvious cyanosis  MS: moves all visible extremities without noticeable abnormality  PSYCH/NEURO: pleasant and cooperative, no obvious depression or anxiety, speech and thought processing grossly intact  ASSESSMENT AND PLAN:  Discussed the following assessment and plan:  Primary insomnia -New problem -Discussed with him with snoring and daytime fatigue along with his BMI that we should evaluate for OSA -Will give him a trial of trazodone 50-100 at night as he also seems to have some anxiety contributing to sleep onset.  -We'll plan to f/u in 1 month to review medication.         I discussed the assessment and treatment plan with the patient. The patient was provided an opportunity to ask questions and all  were answered. The patient agreed with the plan and demonstrated an understanding of the instructions.   The patient was advised to call back or seek an in-person evaluation if the symptoms worsen or if the condition fails to improve as anticipated.     Everrett Coombe, DO

## 2018-11-29 ENCOUNTER — Other Ambulatory Visit: Payer: Self-pay | Admitting: Family Medicine

## 2018-12-02 ENCOUNTER — Other Ambulatory Visit: Payer: Self-pay | Admitting: Family Medicine

## 2018-12-02 ENCOUNTER — Telehealth: Payer: Self-pay | Admitting: Neurology

## 2018-12-02 ENCOUNTER — Encounter: Payer: Self-pay | Admitting: Neurology

## 2018-12-02 NOTE — Telephone Encounter (Signed)
Due to current COVID 19 pandemic, our office is severely reducing in office visits, in order to minimize the risk to our patients and healthcare providers.  Pt understands that although there may be some limitations with this type of visit, we will take all precautions to reduce any security or privacy concerns.  Pt understands that this will be treated like an in office visit and we will file with pt's insurance, and there may be a patient responsible charge related to this service. Pt's email is [email or web address]. Pt understands that the [employer] webex software must be downloaded and operational on the device pt plans to use for the visit. Pt understands that the nurse will be calling to go over pt's chart.

## 2018-12-02 NOTE — Telephone Encounter (Signed)
Sent the pt mychart message to update his chart

## 2018-12-03 ENCOUNTER — Encounter: Payer: Self-pay | Admitting: Neurology

## 2018-12-03 NOTE — Telephone Encounter (Signed)
Pt returned call and I reviewed the patient's chart with him and made sure that everything was up to date. Informed the patient I have sent a mychart message. Pt verbalized understanding.

## 2018-12-03 NOTE — Telephone Encounter (Signed)
Called the pt to review the patients chart. LVM advising the patient that I sent a mychart message to him with the questions that I had and asked that he review that and reply back.  If patient calls back please ask if there has been any change with medications or medical history since his recent visit on 4/8 with his PCP. If not please refer the pt to mychart message to reply the answers.

## 2018-12-09 ENCOUNTER — Encounter: Payer: Self-pay | Admitting: Endocrinology

## 2018-12-09 NOTE — Telephone Encounter (Signed)
Pt called in and stated he needed to cancel his appt due to working , he stated he just started a new job and he will call when he is available

## 2018-12-09 NOTE — Telephone Encounter (Signed)
I have taken the patient off the video calendar for his visit tomorrow.

## 2018-12-10 ENCOUNTER — Institutional Professional Consult (permissible substitution): Payer: Self-pay | Admitting: Neurology

## 2018-12-11 ENCOUNTER — Other Ambulatory Visit: Payer: Self-pay | Admitting: Endocrinology

## 2018-12-11 NOTE — Telephone Encounter (Signed)
Please forward refill request to pt's new primary care provider.  

## 2018-12-14 ENCOUNTER — Other Ambulatory Visit: Payer: Self-pay | Admitting: Endocrinology

## 2018-12-14 ENCOUNTER — Telehealth: Payer: Self-pay | Admitting: Family Medicine

## 2018-12-14 NOTE — Telephone Encounter (Signed)
Please forward refill request to pt's new primary care provider.  

## 2018-12-14 NOTE — Telephone Encounter (Signed)
Copied from CRM [id number]. Topic: Quick Communication - See Telephone Encounter >> Dec 14, 2018  3:55 PM Aretta Nip wrote: CRM for notification. See Telephone encounter for: 12/14/18.furosemide (LASIX) 20 MG tablet    lisinopril (PRINIVIL,ZESTRIL) 20 MG tablet  Pt used Dr Everardo All but he says he needs to get these filled by primary  pcp which is [place]. [id number] - [place], [address] AT [facility] [address] [phone] (Phone) [phone] (Fax) If questions pt number [phone]  - [phone] Pt just had a virtual visit with Cody on 11/25/2018

## 2018-12-15 ENCOUNTER — Other Ambulatory Visit: Payer: Self-pay | Admitting: Family Medicine

## 2018-12-15 DIAGNOSIS — I10 Essential (primary) hypertension: Secondary | ICD-10-CM

## 2018-12-16 NOTE — Telephone Encounter (Signed)
Pt will be called to be schedule for Doxy Me VV.

## 2018-12-16 NOTE — Telephone Encounter (Signed)
Called spoke to wife. She will relay the message to schedule appt 4 wks from LOV for HTN/Insomnia. She will inform her husband, ( the Pt) .

## 2018-12-16 NOTE — Telephone Encounter (Signed)
Please refill x30 days and schedule for f/u for HTN and insomnia.

## 2018-12-18 ENCOUNTER — Other Ambulatory Visit: Payer: Self-pay | Admitting: Family Medicine

## 2018-12-29 ENCOUNTER — Other Ambulatory Visit: Payer: Self-pay | Admitting: Family Medicine

## 2019-01-07 ENCOUNTER — Other Ambulatory Visit: Payer: Self-pay | Admitting: Family Medicine

## 2019-01-08 ENCOUNTER — Other Ambulatory Visit: Payer: Self-pay | Admitting: Family Medicine

## 2019-01-08 DIAGNOSIS — I10 Essential (primary) hypertension: Secondary | ICD-10-CM

## 2019-01-18 ENCOUNTER — Other Ambulatory Visit: Payer: Self-pay

## 2019-01-20 ENCOUNTER — Other Ambulatory Visit: Payer: Self-pay

## 2019-01-20 ENCOUNTER — Encounter: Payer: Self-pay | Admitting: Endocrinology

## 2019-01-20 ENCOUNTER — Ambulatory Visit: Payer: BLUE CROSS/BLUE SHIELD | Admitting: Endocrinology

## 2019-01-20 VITALS — BP 132/70 | HR 92 | Ht 66.0 in | Wt 330.8 lb

## 2019-01-20 DIAGNOSIS — E119 Type 2 diabetes mellitus without complications: Secondary | ICD-10-CM

## 2019-01-20 DIAGNOSIS — Z9119 Patient's noncompliance with other medical treatment and regimen: Secondary | ICD-10-CM

## 2019-01-20 DIAGNOSIS — Z794 Long term (current) use of insulin: Secondary | ICD-10-CM

## 2019-01-20 LAB — POCT GLYCOSYLATED HEMOGLOBIN (HGB A1C): Hemoglobin A1C: 7.5 % — AB (ref 4.0–5.6)

## 2019-01-20 MED ORDER — INSULIN REGULAR HUMAN (CONC) 500 UNIT/ML ~~LOC~~ SOPN: 230.0000 [IU] | PEN_INJECTOR | Freq: Three times a day (TID) | SUBCUTANEOUS | 6 refills | Status: DC

## 2019-01-20 NOTE — Patient Instructions (Addendum)
Please increase the insulin to 230 units 3 times a day (just before each meal).   check your blood sugar twice a day.  vary the time of day when you check, between before the 3 meals, and at bedtime.  also check if you have symptoms of your blood sugar being too high or too low.  please keep a record of the readings and bring it to your next appointment here (or you can bring the meter itself).  You can write it on any piece of paper.  please call us sooner if your blood sugar goes below 70, or if you have a lot of readings over 200.  Please come back here for a follow-up appointment in 2 months.

## 2019-01-20 NOTE — Progress Notes (Signed)
Subjective:    Patient ID: Larry Rose, male    DOB: 07-12-1966, 53 y.o.   MRN: [id number]  HPI Pt returns for f/u of diabetes mellitus:  DM type: Insulin-requiring type 2 Dx'ed: 1993 Complications: none Therapy: insulin since 2009.  DKA: never.  Severe hypoglycemia: never.  Pancreatitis: never.  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   Interval history:  He has resumed working.  Pt says he does not check cbg's, but he says he never misses the insulin doses.   Past Medical History:  Diagnosis Date  . ALCOHOLISM 11/30/2009  . ALLERGIC RHINITIS 04/01/2007  . CARPAL TUNNEL SYNDROME, RIGHT 10/06/2008  . DIABETES MELLITUS, TYPE II 04/01/2007  . Hepatic steatosis   . HYPERLIPIDEMIA, ATHEROGENIC 09/29/2007  . HYPERTENSION 04/01/2007  . Rosacea 05/14/2010  . TINEA CRURIS 11/07/2008    Past Surgical History:  Procedure Laterality Date  . carpel tunnel    . FRACTURE SURGERY      Social History   Socioeconomic History  . Marital status: Married    Spouse name: Not on file  . Number of children: Not on file  . Years of education: Not on file  . Highest education level: Not on file  Occupational History  . Occupation: Works Agricultural engineerndustrial Manufacturing  Social Needs  . Financial resource strain: Not on file  . Food insecurity:    Worry: Not on file    Inability: Not on file  . Transportation needs:    Medical: Not on file    Non-medical: Not on file  Tobacco Use  . Smoking status: Never Smoker  . Smokeless tobacco: Never Used  Substance and Sexual Activity  . Alcohol use: Not Currently    Comment: quit 6 mths ago  . Drug use: Not Currently  . Sexual activity: Not on file  Lifestyle  . Physical activity:    Days per week: Not on file    Minutes per session: Not on file  . Stress: Not on file  Relationships  . Social connections:    Talks on phone: Not on file    Gets together: Not on file   Attends religious service: Not on file    Active member of club or organization: Not on file    Attends meetings of clubs or organizations: Not on file    Relationship status: Not on file  . Intimate partner violence:    Fear of current or ex partner: Not on file    Emotionally abused: Not on file    Physically abused: Not on file    Forced sexual activity: Not on file  Other Topics Concern  . Not on file  Social History Narrative  . Not on file    Current Outpatient Medications on File Prior to Visit  Medication Sig Dispense Refill  . furosemide (LASIX) 20 MG tablet TAKE 1 TABLET EVERY DAY 30 tablet 0  . glucose blood (ONE TOUCH ULTRA TEST) test strip 1 each by Other route 2 (two) times daily. And lancets 2/day 250.01 100 each 12  . lisinopril (ZESTRIL) 10 MG tablet TAKE 2 TABLETS EVERY DAY 60 tablet 0  . lisinopril (ZESTRIL) 20 MG tablet TAKE 1 TABLET BY MOUTH EVERY DAY 30 tablet 0  . MITIGARE 0.6 MG CAPS TAKE 2 TABS (1.2MG ) BY MOUTH FOLLOWED 1 TAB 1 HOUR LATER. CONTINUE 1 TAB DAILY UNTIL RESOLUTION. 30 capsule 0  . Multiple Vitamin (MULTIVITAMIN) tablet Take 1  tablet by mouth daily.      . traZODone (DESYREL) 50 MG tablet TAKE 1 TO 2 TABLETS BY MOUTH AT BEDTIME AS NEEDED FOR SLEEP 180 tablet 1   Current Facility-Administered Medications on File Prior to Visit  Medication Dose Route Frequency Provider Last Rate Last Dose  . triamcinolone acetonide (KENALOG) 10 MG/ML injection 10 mg  10 mg Other Once Asencion Islam, DPM        Allergies  Allergen Reactions  . Enalapril Maleate   . Lovastatin     Family History  Problem Relation Age of Onset  . Heart attack Maternal Grandfather   . Stroke Paternal Grandfather   . Cancer Neg Hx     BP 132/70 (BP Location: Left Arm, Patient Position: Sitting, Cuff Size: Large)   Pulse 92   Ht 5\' 6"  (1.676 m)   Wt (!) 330 lb 12.8 oz (150 kg)   SpO2 92%   BMI 53.39 kg/m    Review of Systems He denies hypoglycemia    Objective:    Physical Exam VITAL SIGNS:  See vs page GENERAL: no distress Pulses: dorsalis pedis intact bilat.   MSK: no deformity of the feet CV: 1+ bilat leg edema.   Skin:  no ulcer on the feet.  normal color and temp on the feet. Neuro: sensation is intact to touch on the feet   A1c=7.5%    Assessment & Plan:  Insulin-requiring type 2 DM: he needs increased rx.  Noncompliance with cbg recording: we discussed.   Patient Instructions  Please increase the insulin to 230 units 3 times a day (just before each meal).   check your blood sugar twice a day.  vary the time of day when you check, between before the 3 meals, and at bedtime.  also check if you have symptoms of your blood sugar being too high or too low.  please keep a record of the readings and bring it to your next appointment here (or you can bring the meter itself).  You can write it on any piece of paper.  please call us sooner if your blood sugar goes below 70, or if you have a lot of readings over 200.  Please come back here for a follow-up appointment in 2 months.

## 2019-01-28 ENCOUNTER — Other Ambulatory Visit: Payer: Self-pay | Admitting: Endocrinology

## 2019-02-03 ENCOUNTER — Other Ambulatory Visit: Payer: Self-pay | Admitting: Family Medicine

## 2019-02-03 DIAGNOSIS — I10 Essential (primary) hypertension: Secondary | ICD-10-CM

## 2019-02-22 ENCOUNTER — Telehealth: Payer: Self-pay | Admitting: Family Medicine

## 2019-02-22 NOTE — Telephone Encounter (Signed)
Pt called and said he wanted to cancel appt for now because he doesn't have insurance at the moment, he said it could be up to 90 days before he gets it. Pt said he would be fine with paying 80 dollar copay but doesn't want to receive a huge bill for visit. I told him the only thing I would know would be the copay and wouldn't know what he would be billed. He said he had his A1C checked 01/20/2019 and wanted to know if it would be okay to wait for appt until he had insurance or if Dr Zigmund Daniel thinks he should come in

## 2019-02-22 NOTE — Telephone Encounter (Signed)
Ok to push f/u out.

## 2019-02-22 NOTE — Telephone Encounter (Signed)
Pt aware and will call back when insurance kicks in to set up appt

## 2019-02-23 ENCOUNTER — Ambulatory Visit: Payer: Commercial Managed Care - PPO | Admitting: Family Medicine

## 2019-03-01 ENCOUNTER — Other Ambulatory Visit: Payer: Self-pay | Admitting: Family Medicine

## 2019-03-02 ENCOUNTER — Other Ambulatory Visit: Payer: Self-pay | Admitting: Family Medicine

## 2019-03-02 DIAGNOSIS — I10 Essential (primary) hypertension: Secondary | ICD-10-CM

## 2019-03-26 ENCOUNTER — Other Ambulatory Visit: Payer: Self-pay | Admitting: Family Medicine

## 2019-03-26 DIAGNOSIS — I10 Essential (primary) hypertension: Secondary | ICD-10-CM

## 2019-04-07 ENCOUNTER — Ambulatory Visit: Payer: Self-pay | Admitting: Endocrinology

## 2019-04-13 ENCOUNTER — Other Ambulatory Visit: Payer: Self-pay | Admitting: Family Medicine

## 2019-04-13 DIAGNOSIS — I10 Essential (primary) hypertension: Secondary | ICD-10-CM

## 2019-04-22 ENCOUNTER — Other Ambulatory Visit: Payer: Self-pay | Admitting: Family Medicine

## 2019-04-22 DIAGNOSIS — I10 Essential (primary) hypertension: Secondary | ICD-10-CM

## 2019-05-17 ENCOUNTER — Other Ambulatory Visit: Payer: Self-pay

## 2019-05-19 ENCOUNTER — Encounter: Payer: Self-pay | Admitting: Endocrinology

## 2019-05-19 ENCOUNTER — Ambulatory Visit: Payer: Self-pay | Admitting: Endocrinology

## 2019-05-19 ENCOUNTER — Other Ambulatory Visit: Payer: Self-pay

## 2019-05-19 VITALS — BP 118/68 | HR 83 | Ht 66.0 in | Wt 331.8 lb

## 2019-05-19 DIAGNOSIS — E119 Type 2 diabetes mellitus without complications: Secondary | ICD-10-CM

## 2019-05-19 LAB — POCT GLYCOSYLATED HEMOGLOBIN (HGB A1C): Hemoglobin A1C: 7 % — AB (ref 4.0–5.6)

## 2019-05-19 NOTE — Progress Notes (Signed)
Subjective:    Patient ID: Larry Rose, male    DOB: March 22, 1966, 53 y.o.   MRN: [id number]  HPI Pt returns for f/u of diabetes mellitus:  DM type: Insulin-requiring type 2 Dx'ed: 5400 Complications: none Therapy: insulin since 2009.  DKA: never.  Severe hypoglycemia: never.  Pancreatitis: never.  Other: he takes multiple daily injections; he declines weight-loss surgery.   Interval history: he says he never misses the insulin doses.  no cbg record, but states cbg's vary from 80-210.  He seldom checks cbg.    Past Medical History:  Diagnosis Date  . ALCOHOLISM 11/30/2009  . ALLERGIC RHINITIS 04/01/2007  . CARPAL TUNNEL SYNDROME, RIGHT 10/06/2008  . DIABETES MELLITUS, TYPE II 04/01/2007  . Hepatic steatosis   . [place], [place] 09/29/2007  . HYPERTENSION 04/01/2007  . Rosacea 05/14/2010  . TINEA CRURIS 11/07/2008    Past Surgical History:  Procedure Laterality Date  . carpel tunnel    . FRACTURE SURGERY      Social History   Socioeconomic History  . Marital status: Married    Spouse name: Not on file  . Number of children: Not on file  . Years of education: Not on file  . Highest education level: Not on file  Occupational History  . Occupation: Works Probation officer  . Financial resource strain: Not on file  . Food insecurity    Worry: Not on file    Inability: Not on file  . Transportation needs    Medical: Not on file    Non-medical: Not on file  Tobacco Use  . Smoking status: Never Smoker  . Smokeless tobacco: Never Used  Substance and Sexual Activity  . Alcohol use: Not Currently    Comment: quit 6 mths ago  . Drug use: Not Currently  . Sexual activity: Not on file  Lifestyle  . Physical activity    Days per week: Not on file    Minutes per session: Not on file  . Stress: Not on file  Relationships  . Social Herbalist on phone: Not on file    Gets together: Not on file    Attends religious service:  Not on file    Active member of club or organization: Not on file    Attends meetings of clubs or organizations: Not on file    Relationship status: Not on file  . Intimate partner violence    Fear of current or ex partner: Not on file    Emotionally abused: Not on file    Physically abused: Not on file    Forced sexual activity: Not on file  Other Topics Concern  . Not on file  Social History Narrative  . Not on file    Current Outpatient Medications on File Prior to Visit  Medication Sig Dispense Refill  . BD PEN NEEDLE NANO U/F 32G X 4 MM MISC USE 3 TIMES PER DAY. 100 each 11  . glucose blood (ONE TOUCH ULTRA TEST) test strip 1 each by Other route 2 (two) times daily. And lancets 2/day 250.01 100 each 12  . insulin regular human CONCENTRATED (HUMULIN R) 500 UNIT/ML kwikpen Inject 230 Units into the skin 3 (three) times daily with meals. 38 pen 6  . lisinopril (ZESTRIL) 10 MG tablet TAKE 2 TABLETS BY MOUTH EVERY DAY 60 tablet 3  . lisinopril (ZESTRIL) 20 MG tablet TAKE 1 TABLET BY MOUTH EVERY DAY 30 tablet 0  . MITIGARE 0.6  MG CAPS TAKE 2 TABS (1.2MG ) BY MOUTH FOLLOWED 1 TAB 1 HOUR LATER. CONTINUE 1 TAB DAILY UNTIL RESOLUTION. 30 capsule 0  . Multiple Vitamin (MULTIVITAMIN) tablet Take 1 tablet by mouth daily.      . traZODone (DESYREL) 50 MG tablet TAKE 1 TO 2 TABLETS BY MOUTH AT BEDTIME AS NEEDED FOR SLEEP 180 tablet 1   Current Facility-Administered Medications on File Prior to Visit  Medication Dose Route Frequency Provider Last Rate Last Dose  . triamcinolone acetonide (KENALOG) 10 MG/ML injection 10 mg  10 mg Other Once Asencion Islam, DPM        Allergies  Allergen Reactions  . Enalapril Maleate   . Lovastatin     Family History  Problem Relation Age of Onset  . Heart attack Maternal Grandfather   . Stroke Paternal Grandfather   . Cancer Neg Hx     BP 118/68 (BP Location: Left Arm, Patient Position: Sitting, Cuff Size: Large)   Pulse 83   Ht 5\' 6"  (1.676 m)    Wt (!) 331 lb 12.8 oz (150.5 kg)   SpO2 90%   BMI 53.55 kg/m    Review of Systems He denies hypoglycemia.      Objective:   Physical Exam VITAL SIGNS:  See vs page GENERAL: no distress Pulses: dorsalis pedis intact bilat.   MSK: no deformity of the feet CV: trace bilat leg edema, and bilat vv's Skin:  no ulcer on the feet.  normal color and temp on the feet. Neuro: sensation is intact to touch on the feet  A1c=7.0%     Assessment & Plan:  Insulin-requiring type 2 DM: well-controlled.  Edema: This limits rx options   Patient Instructions  Please continue the same insulin.   check your blood sugar twice a day.  vary the time of day when you check, between before the 3 meals, and at bedtime.  also check if you have symptoms of your blood sugar being too high or too low.  please keep a record of the readings and bring it to your next appointment here (or you can bring the meter itself).  You can write it on any piece of paper.  please call sooner if your blood sugar goes below 70, or if you have a lot of readings over 200.  Please come back here for a follow-up appointment in 3-4 months.

## 2019-05-19 NOTE — Patient Instructions (Signed)
Please continue the same insulin.   check your blood sugar twice a day.  vary the time of day when you check, between before the 3 meals, and at bedtime.  also check if you have symptoms of your blood sugar being too high or too low.  please keep a record of the readings and bring it to your next appointment here (or you can bring the meter itself).  You can write it on any piece of paper.  please call us sooner if your blood sugar goes below 70, or if you have a lot of readings over 200.  Please come back here for a follow-up appointment in 3-4 months.

## 2019-05-20 ENCOUNTER — Other Ambulatory Visit: Payer: Self-pay | Admitting: Family Medicine

## 2019-05-20 DIAGNOSIS — I10 Essential (primary) hypertension: Secondary | ICD-10-CM

## 2019-05-25 ENCOUNTER — Other Ambulatory Visit: Payer: Self-pay | Admitting: Family Medicine

## 2019-06-15 ENCOUNTER — Other Ambulatory Visit: Payer: Self-pay | Admitting: Family Medicine

## 2019-06-15 DIAGNOSIS — I10 Essential (primary) hypertension: Secondary | ICD-10-CM

## 2019-06-16 ENCOUNTER — Emergency Department (HOSPITAL_COMMUNITY)
Admission: EM | Admit: 2019-06-16 | Discharge: 2019-06-16 | Disposition: A | Discharge status: Home / Self Care | Payer: No Typology Code available for payment source | Attending: Emergency Medicine | Admitting: Emergency Medicine

## 2019-06-16 ENCOUNTER — Other Ambulatory Visit: Payer: Self-pay

## 2019-06-16 ENCOUNTER — Emergency Department (HOSPITAL_COMMUNITY): Payer: No Typology Code available for payment source

## 2019-06-16 DIAGNOSIS — Z23 Encounter for immunization: Secondary | ICD-10-CM | POA: Insufficient documentation

## 2019-06-16 DIAGNOSIS — S62654A Nondisplaced fracture of medial phalanx of right ring finger, initial encounter for closed fracture: Secondary | ICD-10-CM | POA: Diagnosis not present

## 2019-06-16 DIAGNOSIS — S62616B Displaced fracture of proximal phalanx of right little finger, initial encounter for open fracture: Secondary | ICD-10-CM | POA: Insufficient documentation

## 2019-06-16 DIAGNOSIS — S6991XA Unspecified injury of right wrist, hand and finger(s), initial encounter: Secondary | ICD-10-CM | POA: Diagnosis present

## 2019-06-16 DIAGNOSIS — Z79899 Other long term (current) drug therapy: Secondary | ICD-10-CM | POA: Diagnosis not present

## 2019-06-16 DIAGNOSIS — W230XXA Caught, crushed, jammed, or pinched between moving objects, initial encounter: Secondary | ICD-10-CM | POA: Insufficient documentation

## 2019-06-16 DIAGNOSIS — Z794 Long term (current) use of insulin: Secondary | ICD-10-CM | POA: Diagnosis not present

## 2019-06-16 DIAGNOSIS — Y9389 Activity, other specified: Secondary | ICD-10-CM | POA: Diagnosis not present

## 2019-06-16 DIAGNOSIS — T148XXA Other injury of unspecified body region, initial encounter: Secondary | ICD-10-CM

## 2019-06-16 DIAGNOSIS — Y990 Civilian activity done for income or pay: Secondary | ICD-10-CM | POA: Insufficient documentation

## 2019-06-16 DIAGNOSIS — E119 Type 2 diabetes mellitus without complications: Secondary | ICD-10-CM | POA: Diagnosis not present

## 2019-06-16 DIAGNOSIS — Y9289 Other specified places as the place of occurrence of the external cause: Secondary | ICD-10-CM | POA: Insufficient documentation

## 2019-06-16 DIAGNOSIS — I10 Essential (primary) hypertension: Secondary | ICD-10-CM | POA: Diagnosis not present

## 2019-06-16 MED ORDER — CEPHALEXIN 500 MG PO CAPS: 500.0000 mg | ORAL_CAPSULE | Freq: Three times a day (TID) | ORAL | 0 refills | Status: DC

## 2019-06-16 MED ORDER — OXYCODONE-ACETAMINOPHEN 5-325 MG PO TABS: 1.0000 | ORAL_TABLET | Freq: Four times a day (QID) | ORAL | 0 refills | Status: DC | PRN

## 2019-06-16 MED ORDER — TETANUS-DIPHTH-ACELL PERTUSSIS 5-2.5-18.5 LF-MCG/0.5 IM SUSP
0.5000 mL | Freq: Once | INTRAMUSCULAR | Status: AC
  Administered 2019-06-16: 0.5 mL via INTRAMUSCULAR
  Filled 2019-06-16: qty 0.5

## 2019-06-16 MED ORDER — CEFAZOLIN SODIUM-DEXTROSE 2-4 GM/100ML-% IV SOLN
2.0000 g | Freq: Once | INTRAVENOUS | Status: AC
  Administered 2019-06-16: 2 g via INTRAVENOUS
  Filled 2019-06-16: qty 100

## 2019-06-16 MED ORDER — LIDOCAINE HCL 2 % IJ SOLN
20.0000 mL | Freq: Once | INTRAMUSCULAR | Status: AC
  Administered 2019-06-16: 400 mg
  Filled 2019-06-16: qty 20

## 2019-06-16 NOTE — ED Provider Notes (Signed)
[facility] Provider Note   CSN: [id number] Arrival date & time: 06/16/19  [id number]     History   Chief Complaint Chief Complaint  Patient presents with   Finger Injury    HPI Larry Rose S Rose is a 53 y.o. male.     HPI   53 year old right-hand-dominant male presents today with injury to his right hand.  Patient notes that Larry Rose was using a forklift when his finger got caught between the forklift and a box.  Larry Rose notes crushing the right third and fourth fingers with a laceration over the dorsal aspect of the right fifth finger.  Patient notes no loss of range of motion or function of the fingers Larry Rose is uncertain when his last tetanus shot was.  Denies any other injuries.  Past Medical History:  Diagnosis Date   ALCOHOLISM 11/30/2009   ALLERGIC RHINITIS 04/01/2007   CARPAL TUNNEL SYNDROME, RIGHT 10/06/2008   DIABETES MELLITUS, TYPE II 04/01/2007   Hepatic steatosis    HYPERLIPIDEMIA, ATHEROGENIC 09/29/2007   HYPERTENSION 04/01/2007   Rosacea 05/14/2010   TINEA CRURIS 11/07/2008    Patient Active Problem List   Diagnosis Date Noted   Primary insomnia 11/25/2018   Gout 08/25/2018   Wellness examination 04/24/2016   Dyspnea 04/24/2016   Pain in joint, ankle and foot 11/25/2014   Finger pain, right 11/25/2014   Diabetes (HCC) 06/21/2014   Routine general medical examination at a health care facility 11/17/2013   Encounter for long-term (current) use of other medications 12/17/2012   Hyposmolality and/or hyponatremia 05/21/2011   Conductive hearing loss, unilateral 05/21/2011   ROSACEA 05/14/2010   ALCOHOLISM 11/30/2009   TINEA CRURIS 11/07/2008   CARPAL TUNNEL SYNDROME, RIGHT 10/06/2008   Dyslipidemia 09/29/2007   Essential hypertension 04/01/2007   ALLERGIC RHINITIS 04/01/2007    Past Surgical History:  Procedure Laterality Date   carpel tunnel     FRACTURE SURGERY          Home Medications     Prior to Admission medications   Medication Sig Start Date End Date Taking? Authorizing Provider  BD PEN NEEDLE NANO U/F 32G X 4 MM MISC USE 3 TIMES PER DAY. 01/29/19   Romero BellingEllison, Sean, MD  cephALEXin (KEFLEX) 500 MG capsule Take 1 capsule (500 mg total) by mouth 3 (three) times daily. 06/16/19   Cheresa Siers, Tinnie GensJeffrey, PA-C  furosemide (LASIX) 20 MG tablet TAKE 1 TABLET BY MOUTH EVERY DAY 06/15/19   Everrett CoombeMatthews, Cody, DO  glucose blood (ONE TOUCH ULTRA TEST) test strip 1 each by Other route 2 (two) times daily. And lancets 2/day 250.01 12/26/17   Romero BellingEllison, Sean, MD  insulin regular human CONCENTRATED (HUMULIN R) 500 UNIT/ML kwikpen Inject 230 Units into the skin 3 (three) times daily with meals. 01/20/19   Romero BellingEllison, Sean, MD  lisinopril (ZESTRIL) 10 MG tablet TAKE 2 TABLETS BY MOUTH EVERY DAY 05/25/19   Everrett CoombeMatthews, Cody, DO  lisinopril (ZESTRIL) 20 MG tablet TAKE 1 TABLET BY MOUTH EVERY DAY 12/16/18   Ashley RoyaltyMatthews, Cody, DO  MITIGARE 0.6 MG CAPS TAKE 2 TABS (1.2MG ) BY MOUTH FOLLOWED 1 TAB 1 HOUR LATER. CONTINUE 1 TAB DAILY UNTIL RESOLUTION. 12/29/18   Everrett CoombeMatthews, Cody, DO  Multiple Vitamin (MULTIVITAMIN) tablet Take 1 tablet by mouth daily.      [provider]  oxyCODONE-acetaminophen (PERCOCET/ROXICET) 5-325 MG tablet Take 1 tablet by mouth every 6 (six) hours as needed. 06/16/19   Stanford Strauch, Tinnie GensJeffrey, PA-C  traZODone (DESYREL) 50 MG tablet TAKE 1 TO  2 TABLETS BY MOUTH AT BEDTIME AS NEEDED FOR SLEEP 12/18/18   Everrett Coombe, DO    Family History Family History  Problem Relation Age of Onset   Heart attack Maternal Grandfather    Stroke Paternal Grandfather    Cancer Neg Hx     Social History Social History   Tobacco Use   Smoking status: Never Smoker   Smokeless tobacco: Never Used  Substance Use Topics   Alcohol use: Not Currently    Comment: quit 6 mths ago   Drug use: Not Currently     Allergies   Enalapril maleate and Lovastatin   Review of Systems Review of Systems  All other  systems reviewed and are negative.  Physical Exam Updated Vital Signs BP 140/80 (BP Location: Left Arm)    Pulse 86    Temp 98 F (36.7 C) (Oral)    Resp 16    SpO2 100%   Physical Exam Vitals signs and nursing note reviewed.  Constitutional:      Appearance: Larry Rose is well-developed.  HENT:     Head: Normocephalic and atraumatic.  Eyes:     General: No scleral icterus.       Right eye: No discharge.        Left eye: No discharge.     Conjunctiva/sclera: Conjunctivae normal.     Pupils: Pupils are equal, round, and reactive to light.  Neck:     Musculoskeletal: Normal range of motion.     Vascular: No JVD.     Trachea: No tracheal deviation.  Pulmonary:     Effort: Pulmonary effort is normal.     Breath sounds: No stridor.  Musculoskeletal:     Comments: 2 cm laceration of the dorsal aspect of the right fifth finger, tendons intact sensation, flexion and extension intact at the DIP PIP and MCP-tenderness to palpation of the proximal middle phalanx of the right fourth finger strength sensation motor function intact as well  Neurological:     Mental Status: Larry Rose is alert and oriented to person, place, and time.     Coordination: Coordination normal.  Psychiatric:        Behavior: Behavior normal.        Thought Content: Thought content normal.        Judgment: Judgment normal.        ED Treatments / Results  Labs (all labs ordered are listed, but only abnormal results are displayed) Labs Reviewed - No data to display  EKG None  Radiology Dg Hand Complete Right  Result Date: 06/16/2019 CLINICAL DATA:  Hand injury EXAM: RIGHT HAND - COMPLETE 3+ VIEW COMPARISON:  2016 FINDINGS: Multiplanar fracture of the shaft of the fourth middle phalanx without significant displacement. Additional minimally displaced, mildly comminuted fracture of the base of the fifth middle phalanx. There is no intra-articular extension. Plate and screw fixation of the distal radius is again noted.  Similar appearance of degenerative changes at the distal interphalangeal joints. IMPRESSION: Acute fractures of the right fourth and fifth middle phalanges. Electronically Signed   By: Guadlupe Spanish M.D.   On: 06/16/2019 10:26    Procedures .Marland KitchenLaceration Repair  Date/Time: 06/16/2019 3:42 PM Performed by: Eyvonne Mechanic, PA-C Authorized by: Eyvonne Mechanic, PA-C   Consent:    Consent obtained:  Verbal   Consent given by:  Patient   Risks discussed:  Infection, need for additional repair, pain, poor cosmetic result and tendon damage   Alternatives discussed:  No treatment Anesthesia (see MAR  for exact dosages):    Anesthesia method:  Nerve block   Block needle gauge:  25 G   Block anesthetic:  Lidocaine 2% w/o epi   Block technique:  Digital    Block injection procedure:  Anatomic landmarks identified, anatomic landmarks palpated, introduced needle, negative aspiration for blood and incremental injection   Block outcome:  Anesthesia achieved Laceration details:    Location: rigth 5th finger    Length (cm):  2 Repair type:    Repair type:  Simple Pre-procedure details:    Preparation:  Patient was prepped and draped in usual sterile fashion and imaging obtained to evaluate for foreign bodies Exploration:    Hemostasis achieved with:  Direct pressure   Wound exploration: wound explored through full range of motion and entire depth of wound probed and visualized     Wound extent: underlying fracture     Wound extent: no fascia violation noted, no foreign bodies/material noted, no nerve damage noted, no tendon damage noted and no vascular damage noted     Contaminated: no   Treatment:    Area cleansed with:  Betadine and saline   Amount of cleaning:  Extensive   Irrigation solution:  Sterile water Skin repair:    Repair method:  Sutures   Suture size:  4-0   Suture material:  Fast-absorbing gut   Suture technique:  Simple interrupted   Number of sutures:  3 Approximation:     Approximation:  Close Post-procedure details:    Dressing:  Non-adherent dressing   Patient tolerance of procedure:  Tolerated well, no immediate complications   (including critical care time)  Medications Ordered in ED Medications  lidocaine (XYLOCAINE) 2 % (with pres) injection 400 mg (400 mg Infiltration Given 06/16/19 1148)  Tdap (BOOSTRIX) injection 0.5 mL (0.5 mLs Intramuscular Given 06/16/19 1149)  ceFAZolin (ANCEF) IVPB 2g/100 mL premix (0 g Intravenous Stopped 06/16/19 1228)     Initial Impression / Assessment and Plan / ED Course  I have reviewed the triage vital signs and the nursing notes.  Pertinent labs & imaging results that were available during my care of the patient were reviewed by me and considered in my medical decision making (see chart for details).          Assessment/Plan: 53 year old male presents today with open fracture.  Larry Rose has no signs of tendon involvement.  Wound was thoroughly irrigated and cleansed, Ancef given.  Patient care shared with orthopedic team who recommended closure of the wound Keflex at home and follow-up with orthopedics.  Patient had splint placed discharged with return precautions and follow-up information.  Verbalized understanding and agreement to this plan.     Final Clinical Impressions(s) / ED Diagnoses   Final diagnoses:  Open fracture    ED Discharge Orders         Ordered    cephALEXin (KEFLEX) 500 MG capsule  3 times daily     06/16/19 1331    oxyCODONE-acetaminophen (PERCOCET/ROXICET) 5-325 MG tablet  Every 6 hours PRN     06/16/19 1331           Okey Regal, PA-C 06/16/19 1544    Little, Wenda Overland, MD 06/17/19 2351

## 2019-06-16 NOTE — Consult Note (Signed)
Reason for Consult:Right finger fxs Referring Physician: R Little  Larry Rose is an 53 y.o. male.  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 He had immediate pain and a cut on the little finger and came to the ED for evaluation. X-rays showed middle phalanx fxs of ring and little fingers with the little being open and hand surgery was consulted. He is RHD.  Past Medical History:  Diagnosis Date  . ALCOHOLISM 11/30/2009  . ALLERGIC RHINITIS 04/01/2007  . CARPAL TUNNEL SYNDROME, RIGHT 10/06/2008  . DIABETES MELLITUS, TYPE II 04/01/2007  . Hepatic steatosis   . HYPERLIPIDEMIA, ATHEROGENIC 09/29/2007  . HYPERTENSION 04/01/2007  . Rosacea 05/14/2010  . TINEA CRURIS 11/07/2008    Past Surgical History:  Procedure Laterality Date  . carpel tunnel    . FRACTURE SURGERY      Family History  Problem Relation Age of Onset  . Heart attack Maternal Grandfather   . Stroke Paternal Grandfather   . Cancer Neg Hx     Social History:  reports that he has never smoked. He has never used smokeless tobacco. He reports previous alcohol use. He reports previous drug use.  Allergies:  Allergies  Allergen Reactions  . Enalapril Maleate   . Lovastatin     Medications: I have reviewed the patient's current medications.  No results found for this or any previous visit (from the past 48 hour(s)).  Dg Hand Complete Right  Result Date: 06/16/2019 CLINICAL DATA:  Hand injury EXAM: RIGHT HAND - COMPLETE 3+ VIEW COMPARISON:  2016 FINDINGS: Multiplanar fracture of the shaft of the fourth middle phalanx without significant displacement. Additional minimally displaced, mildly comminuted fracture of the base of the fifth middle phalanx. There is no intra-articular extension. Plate and screw fixation of the distal radius is again noted. Similar appearance of degenerative changes at the distal interphalangeal joints. IMPRESSION: Acute  fractures of the right fourth and fifth middle phalanges. Electronically Signed   By: Guadlupe Spanish M.D.   On: 06/16/2019 10:26    Review of Systems  Constitutional: Negative for weight loss.  HENT: Negative for ear discharge, ear pain, hearing loss and tinnitus.   Eyes: Negative for blurred vision, double vision, photophobia and pain.  Respiratory: Negative for cough, sputum production and shortness of breath.   Cardiovascular: Negative for chest pain.  Gastrointestinal: Negative for abdominal pain, nausea and vomiting.  Genitourinary: Negative for dysuria, flank pain, frequency and urgency.  Musculoskeletal: Positive for joint pain (Right ring/little fingers). Negative for back pain, falls, myalgias and neck pain.  Neurological: Negative for dizziness, tingling, sensory change, focal weakness, loss of consciousness and headaches.  Endo/Heme/Allergies: Does not bruise/bleed easily.  Psychiatric/Behavioral: Negative for depression, memory loss and substance abuse. The patient is not nervous/anxious.    Blood pressure 140/80, pulse 86, temperature 98 F (36.7 C), temperature source Oral, resp. rate 16, SpO2 100 %. Physical Exam  Constitutional: He appears well-developed and well-nourished. No distress.  HENT:  Head: Normocephalic and atraumatic.  Eyes: Conjunctivae are normal. Right eye exhibits no discharge. Left eye exhibits no discharge. No scleral icterus.  Neck: Normal range of motion.  Cardiovascular: Normal rate and regular rhythm.  Respiratory: Effort normal. No respiratory distress.  Musculoskeletal:     Comments: Right shoulder, elbow, wrist, digits- Transverse laceration P2 volar little finger, s/p digital block but normal sensation per pt prior, no instability, no blocks to motion  Sens  Ax/R/M/U intact  Mot   Ax/ R/ PIN/ M/ AIN/ U intact  Rad 2+  Neurological: He is alert.  Skin: Skin is warm and dry. He is not diaphoretic.  Psychiatric: He has a normal mood and affect.  His behavior is normal.    Assessment/Plan: Right ring/little middle phalanx fxs -- Will plan on ED I&D, abx, loose closure and splinting. F/u with Dr. Caralyn Guile in office early next week. DM    Lisette Abu, PA-C Orthopedic Surgery [phone] 06/16/2019, 12:42 PM

## 2019-06-16 NOTE — ED Triage Notes (Signed)
Pt endorses a 50lb crate falling on his right hand injuring his right pinky and ring finger. Still has some ROM to both fingers and feeling intact.

## 2019-06-16 NOTE — Progress Notes (Signed)
Orthopedic Tech Progress Note Patient Details:  Larry Rose 1966-07-29 [id number]  Ortho Devices Type of Ortho Device: Ace wrap, Short arm splint Ortho Device/Splint Interventions: Ordered, Application   Post Interventions Patient Tolerated: Well Instructions Provided: Care of device   Braulio Bosch 06/16/2019, 2:01 PM

## 2019-06-16 NOTE — Discharge Instructions (Signed)
Please read attached information. If you experience any new or worsening signs or symptoms please return to the emergency room for evaluation. Please follow-up with your primary care provider or specialist as discussed. Please use medication prescribed only as directed and discontinue taking if you have any concerning signs or symptoms.   °

## 2019-07-08 ENCOUNTER — Other Ambulatory Visit: Payer: Self-pay | Admitting: Family Medicine

## 2019-07-08 DIAGNOSIS — I10 Essential (primary) hypertension: Secondary | ICD-10-CM

## 2019-09-12 ENCOUNTER — Other Ambulatory Visit: Payer: Self-pay | Admitting: Family Medicine

## 2019-09-12 DIAGNOSIS — I10 Essential (primary) hypertension: Secondary | ICD-10-CM

## 2019-09-22 ENCOUNTER — Other Ambulatory Visit: Payer: Self-pay

## 2019-09-24 ENCOUNTER — Ambulatory Visit: Payer: Self-pay | Admitting: Endocrinology

## 2019-09-27 ENCOUNTER — Ambulatory Visit: Payer: BLUE CROSS/BLUE SHIELD | Admitting: Endocrinology

## 2019-09-27 ENCOUNTER — Other Ambulatory Visit: Payer: Self-pay

## 2019-09-27 ENCOUNTER — Encounter: Payer: Self-pay | Admitting: Endocrinology

## 2019-09-27 VITALS — BP 140/80 | HR 86 | Ht 66.0 in | Wt 335.8 lb

## 2019-09-27 DIAGNOSIS — E119 Type 2 diabetes mellitus without complications: Secondary | ICD-10-CM | POA: Diagnosis not present

## 2019-09-27 LAB — POCT GLYCOSYLATED HEMOGLOBIN (HGB A1C): Hemoglobin A1C: 8.6 % — AB (ref 4.0–5.6)

## 2019-09-27 MED ORDER — FARXIGA 5 MG PO TABS: 5.0000 mg | ORAL_TABLET | Freq: Every day | ORAL | 11 refills | Status: DC

## 2019-09-27 NOTE — Progress Notes (Signed)
Subjective:    Patient ID: Larry Rose, male    DOB: 08-03-1966, 54 y.o.   MRN: [id number]  HPI Pt returns for f/u of diabetes mellitus:  DM type: Insulin-requiring type 2 Dx'ed: 1993 Complications: none Therapy: insulin since 2009.  DKA: never.  Severe hypoglycemia: never.  Pancreatitis: never.  Other: he takes multiple daily injections; he declines weight-loss surgery.   Interval history: he says he never misses the insulin doses.  no cbg record, but states cbg's are often over 200.  He seldom checks cbg.   Past Medical History:  Diagnosis Date  . ALCOHOLISM 11/30/2009  . ALLERGIC RHINITIS 04/01/2007  . CARPAL TUNNEL SYNDROME, RIGHT 10/06/2008  . DIABETES MELLITUS, TYPE II 04/01/2007  . Hepatic steatosis   . HYPERLIPIDEMIA, ATHEROGENIC 09/29/2007  . HYPERTENSION 04/01/2007  . Rosacea 05/14/2010  . TINEA CRURIS 11/07/2008    Past Surgical History:  Procedure Laterality Date  . carpel tunnel    . FRACTURE SURGERY      Social History   Socioeconomic History  . Marital status: Married    Spouse name: Not on file  . Number of children: Not on file  . Years of education: Not on file  . Highest education level: Not on file  Occupational History  . Occupation: Works Archivist  . Smoking status: Never Smoker  . Smokeless tobacco: Never Used  Substance and Sexual Activity  . Alcohol use: Not Currently    Comment: quit 6 mths ago  . Drug use: Not Currently  . Sexual activity: Not on file  Other Topics Concern  . Not on file  Social History Narrative  . Not on file   Social Determinants of Health   Financial Resource Strain:   . Difficulty of Paying Living Expenses: Not on file  Food Insecurity:   . Worried About Programme researcher, broadcasting/film/video in the Last Year: Not on file  . Ran Out of Food in the Last Year: Not on file  Transportation Needs:   . Lack of Transportation (Medical): Not on file  . Lack of Transportation (Non-Medical): Not on  file  Physical Activity:   . Days of Exercise per Week: Not on file  . Minutes of Exercise per Session: Not on file  Stress:   . Feeling of Stress : Not on file  Social Connections:   . Frequency of Communication with Friends and Family: Not on file  . Frequency of Social Gatherings with Friends and Family: Not on file  . Attends Religious Services: Not on file  . Active Member of Clubs or Organizations: Not on file  . Attends Banker Meetings: Not on file  . Marital Status: Not on file  Intimate Partner Violence:   . Fear of Current or Ex-Partner: Not on file  . Emotionally Abused: Not on file  . Physically Abused: Not on file  . Sexually Abused: Not on file    Current Outpatient Medications on File Prior to Visit  Medication Sig Dispense Refill  . BD PEN NEEDLE NANO U/F 32G X 4 MM MISC USE 3 TIMES PER DAY. 100 each 11  . furosemide (LASIX) 20 MG tablet TAKE 1 TABLET BY MOUTH EVERY DAY 30 tablet 2  . glucose blood (ONE TOUCH ULTRA TEST) test strip 1 each by Other route 2 (two) times daily. And lancets 2/day 250.01 100 each 12  . insulin regular human CONCENTRATED (HUMULIN R) 500 UNIT/ML kwikpen Inject 230 Units into the skin  3 (three) times daily with meals. 38 pen 6  . lisinopril (ZESTRIL) 10 MG tablet TAKE 2 TABLETS BY MOUTH EVERY DAY 180 tablet 1  . lisinopril (ZESTRIL) 20 MG tablet TAKE 1 TABLET BY MOUTH EVERY DAY 30 tablet 0  . MITIGARE 0.6 MG CAPS TAKE 2 TABS (1.2MG ) BY MOUTH FOLLOWED 1 TAB 1 HOUR LATER. CONTINUE 1 TAB DAILY UNTIL RESOLUTION. 30 capsule 0  . Multiple Vitamin (MULTIVITAMIN) tablet Take 1 tablet by mouth daily.      Marland Kitchen oxyCODONE-acetaminophen (PERCOCET/ROXICET) 5-325 MG tablet Take 1 tablet by mouth every 6 (six) hours as needed. 10 tablet 0  . traZODone (DESYREL) 50 MG tablet TAKE 1 TO 2 TABLETS BY MOUTH AT BEDTIME AS NEEDED FOR SLEEP 180 tablet 1   Current Facility-Administered Medications on File Prior to Visit  Medication Dose Route Frequency  Provider Last Rate Last Admin  . triamcinolone acetonide (KENALOG) 10 MG/ML injection 10 mg  10 mg Other Once Landis Martins, DPM        Allergies  Allergen Reactions  . Enalapril Maleate   . Lovastatin     Family History  Problem Relation Age of Onset  . Heart attack Maternal Grandfather   . Stroke Paternal Grandfather   . Cancer Neg Hx     BP 140/80 (BP Location: Left Wrist, Patient Position: Sitting, Cuff Size: Large)   Pulse 86   Ht 5\' 6"  (1.676 m)   Wt (!) 335 lb 12.8 oz (152.3 kg)   SpO2 91%   BMI 54.20 kg/m    Review of Systems Main symptom is chronic weight gain.      Objective:   Physical Exam VITAL SIGNS:  See vs page GENERAL: no distress Pulses: dorsalis pedis intact bilat.   MSK: no deformity of the feet CV: trace bilat leg edema, and bilat vv's Skin:  no ulcer on the feet, but the skin is dry.  normal color and temp on the feet. Neuro: sensation is intact to touch on the feet  A1c=8.6%     Assessment & Plan:  Insulin-requiring type 2 DM: worse Weight gain: Wilder Glade might help HTN: is noted today  Patient Instructions  I have sent a prescription to your pharmacy, to add "Farxiga." Please continue the same insulin check your blood sugar twice a day.  vary the time of day when you check, between before the 3 meals, and at bedtime.  also check if you have symptoms of your blood sugar being too high or too low.  please keep a record of the readings and bring it to your next appointment here (or you can bring the meter itself).  You can write it on any piece of paper.  please call us sooner if your blood sugar goes below 70, or if you have a lot of readings over 200. Please come back for a follow-up appointment in 2 months.

## 2019-09-27 NOTE — Patient Instructions (Addendum)
I have sent a prescription to your pharmacy, to add "Larry Rose." Please continue the same insulin check your blood sugar twice a day.  vary the time of day when you check, between before the 3 meals, and at bedtime.  also check if you have symptoms of your blood sugar being too high or too low.  please keep a record of the readings and bring it to your next appointment here (or you can bring the meter itself).  You can write it on any piece of paper.  please call us sooner if your blood sugar goes below 70, or if you have a lot of readings over 200. Please come back for a follow-up appointment in 2 months.

## 2019-11-10 ENCOUNTER — Ambulatory Visit: Payer: BLUE CROSS/BLUE SHIELD | Attending: Internal Medicine

## 2019-11-10 DIAGNOSIS — Z20822 Contact with and (suspected) exposure to covid-19: Secondary | ICD-10-CM

## 2019-11-11 LAB — NOVEL CORONAVIRUS, NAA: SARS-CoV-2, NAA: NOT DETECTED

## 2019-11-11 LAB — SARS-COV-2, NAA 2 DAY TAT

## 2019-11-13 ENCOUNTER — Other Ambulatory Visit: Payer: Self-pay | Admitting: Family Medicine

## 2019-11-25 ENCOUNTER — Ambulatory Visit: Payer: BLUE CROSS/BLUE SHIELD | Admitting: Endocrinology

## 2019-11-28 ENCOUNTER — Other Ambulatory Visit: Payer: Self-pay | Admitting: Family Medicine

## 2019-11-28 ENCOUNTER — Other Ambulatory Visit: Payer: Self-pay | Admitting: Endocrinology

## 2019-11-28 DIAGNOSIS — I10 Essential (primary) hypertension: Secondary | ICD-10-CM

## 2019-11-29 NOTE — Telephone Encounter (Signed)
I spoke with pt and he informed me that he will be following up with Dr. Ashley Royalty in [place]. Last fill 09/13/19  #30/2

## 2019-11-29 NOTE — Telephone Encounter (Signed)
Was refilled 2 weeiks ago.

## 2019-11-29 NOTE — Telephone Encounter (Signed)
Please see message and advise.  Thank you. ° °

## 2019-12-03 ENCOUNTER — Ambulatory Visit: Payer: BLUE CROSS/BLUE SHIELD | Admitting: Family Medicine

## 2019-12-05 ENCOUNTER — Encounter: Payer: Self-pay | Admitting: Family Medicine

## 2019-12-14 ENCOUNTER — Other Ambulatory Visit: Payer: Self-pay

## 2019-12-17 ENCOUNTER — Encounter: Payer: Self-pay | Admitting: Endocrinology

## 2019-12-17 ENCOUNTER — Ambulatory Visit: Payer: BC Managed Care – PPO | Admitting: Endocrinology

## 2019-12-17 ENCOUNTER — Other Ambulatory Visit: Payer: Self-pay

## 2019-12-17 VITALS — BP 130/70 | HR 75 | Ht 66.0 in | Wt 323.0 lb

## 2019-12-17 DIAGNOSIS — E119 Type 2 diabetes mellitus without complications: Secondary | ICD-10-CM | POA: Diagnosis not present

## 2019-12-17 LAB — POCT GLYCOSYLATED HEMOGLOBIN (HGB A1C): Hemoglobin A1C: 6.7 % — AB (ref 4.0–5.6)

## 2019-12-17 LAB — HM DIABETES FOOT EXAM

## 2019-12-17 MED ORDER — HUMULIN R U-500 KWIKPEN 500 UNIT/ML ~~LOC~~ SOPN: PEN_INJECTOR | SUBCUTANEOUS | 3 refills | Status: DC

## 2019-12-17 NOTE — Progress Notes (Signed)
Subjective:    Patient ID: Larry Rose, male    DOB: [birth year], 54 y.o.   MRN: [id number]  HPI Pt returns for f/u of diabetes mellitus:  DM type: Insulin-requiring type 2 Dx'ed: 4098 Complications: none Therapy: insulin since 2009, and Farxiga.  DKA: never.  Severe hypoglycemia: never.  Pancreatitis: never.  Other: he takes multiple daily injections; he declines weight-loss surgery.   Interval history: Due to hypoglycemia, he reduced insulin to 3 times a day (just before each meal) 150-140-150 units.  This change resolved hypoglycemia.  pt states he feels well in general.  no cbg record, but states cbg's are well-controlled on reduced insulin.   Past Medical History:  Diagnosis Date  . ALCOHOLISM 11/30/2009  . ALLERGIC RHINITIS 04/01/2007  . CARPAL TUNNEL SYNDROME, RIGHT 10/06/2008  . DIABETES MELLITUS, TYPE II 04/01/2007  . Hepatic steatosis   . [place], [place] 09/29/2007  . HYPERTENSION 04/01/2007  . Rosacea 05/14/2010  . TINEA CRURIS 11/07/2008    Past Surgical History:  Procedure Laterality Date  . carpel tunnel    . FRACTURE SURGERY      Social History   Socioeconomic History  . Marital status: Married    Spouse name: Not on file  . Number of children: Not on file  . Years of education: Not on file  . Highest education level: Not on file  Occupational History  . Occupation: Works Teacher, early years/pre  . Smoking status: Never Smoker  . Smokeless tobacco: Never Used  Substance and Sexual Activity  . Alcohol use: Not Currently    Comment: quit 6 mths ago  . Drug use: Not Currently  . Sexual activity: Not on file  Other Topics Concern  . Not on file  Social History Narrative  . Not on file   Social Determinants of Health   Financial Resource Strain:   . Difficulty of Paying Living Expenses:   Food Insecurity:   . Worried About Charity fundraiser in the Last Year:   . Arboriculturist in the Last Year:   Transportation  Needs:   . Film/video editor (Medical):   Marland Kitchen Lack of Transportation (Non-Medical):   Physical Activity:   . Days of Exercise per Week:   . Minutes of Exercise per Session:   Stress:   . Feeling of Stress :   Social Connections:   . Frequency of Communication with Friends and Family:   . Frequency of Social Gatherings with Friends and Family:   . Attends Religious Services:   . Active Member of Clubs or Organizations:   . Attends Archivist Meetings:   Marland Kitchen Marital Status:   Intimate Partner Violence:   . Fear of Current or Ex-Partner:   . Emotionally Abused:   Marland Kitchen Physically Abused:   . Sexually Abused:     Current Outpatient Medications on File Prior to Visit  Medication Sig Dispense Refill  . BD PEN NEEDLE NANO U/F 32G X 4 MM MISC USE 3 TIMES PER DAY. 100 each 11  . dapagliflozin propanediol (FARXIGA) 5 MG TABS tablet Take 5 mg by mouth daily before breakfast. 30 tablet 11  . furosemide (LASIX) 20 MG tablet TAKE 1 TABLET BY MOUTH EVERY DAY 30 tablet 2  . glucose blood (ONE TOUCH ULTRA TEST) test strip 1 each by Other route 2 (two) times daily. And lancets 2/day 250.01 100 each 12  . lisinopril (ZESTRIL) 10 MG tablet Take 2qd (Plz sched appt with  new provider) 180 tablet 0  . lisinopril (ZESTRIL) 20 MG tablet TAKE 1 TABLET BY MOUTH EVERY DAY 30 tablet 0  . MITIGARE 0.6 MG CAPS TAKE 2 TABS (1.2MG ) BY MOUTH FOLLOWED 1 TAB 1 HOUR LATER. CONTINUE 1 TAB DAILY UNTIL RESOLUTION. 30 capsule 0  . Multiple Vitamin (MULTIVITAMIN) tablet Take 1 tablet by mouth daily.      Marland Kitchen oxyCODONE-acetaminophen (PERCOCET/ROXICET) 5-325 MG tablet Take 1 tablet by mouth every 6 (six) hours as needed. 10 tablet 0  . traZODone (DESYREL) 50 MG tablet TAKE 1 TO 2 TABLETS BY MOUTH AT BEDTIME AS NEEDED FOR SLEEP 180 tablet 1   Current Facility-Administered Medications on File Prior to Visit  Medication Dose Route Frequency Provider Last Rate Last Admin  . triamcinolone acetonide (KENALOG) 10 MG/ML  injection 10 mg  10 mg Other Once Asencion Islam, DPM        Allergies  Allergen Reactions  . Enalapril Maleate   . Lovastatin     Family History  Problem Relation Age of Onset  . Heart attack Maternal Grandfather   . Stroke Paternal Grandfather   . Cancer Neg Hx     BP 130/70   Pulse 75   Ht 5\' 6"  (1.676 m)   Wt (!) 323 lb (146.5 kg)   SpO2 92%   BMI 52.13 kg/m   ROS: He has lost 12 lbs since last ov.       Objective:   Physical Exam VITAL SIGNS:  See vs page GENERAL: no distress Pulses: dorsalis pedis intact bilat.   MSK: no deformity of the feet CV: 1+ bilat leg edema Skin:  no ulcer on the feet.  normal color and temp on the feet. Neuro: sensation is intact to touch on the feet    Lab Results  Component Value Date   HGBA1C 6.7 (A) 12/17/2019       Assessment & Plan:  Insulin-requiring type 2 DM: well-controlled Obesity, persistent: we discussed addition of GLP, but he declines. Hypoglycemia, due to insulin: improved with insulin reduction: I advised him to continue reduced dosage.    Patient Instructions  Blood tests are requested for you today.  We'll let you know about the results.   check your blood sugar twice a day.  vary the time of day when you check, between before the 3 meals, and at bedtime.  also check if you have symptoms of your blood sugar being too high or too low.  please keep a record of the readings and bring it to your next appointment here (or you can bring the meter itself).  You can write it on any piece of paper.  please call 12/19/2019 sooner if your blood sugar goes below 70, or if you have a lot of readings over 200.   Please continue the same insulin.   Please come back for a follow-up appointment in 3 months.

## 2019-12-17 NOTE — Patient Instructions (Addendum)
Blood tests are requested for you today.  We'll let you know about the results.   check your blood sugar twice a day.  vary the time of day when you check, between before the 3 meals, and at bedtime.  also check if you have symptoms of your blood sugar being too high or too low.  please keep a record of the readings and bring it to your next appointment here (or you can bring the meter itself).  You can write it on any piece of paper.  please call us sooner if your blood sugar goes below 70, or if you have a lot of readings over 200.   Please continue the same insulin.   Please come back for a follow-up appointment in 3 months.

## 2019-12-18 LAB — BASIC METABOLIC PANEL
BUN/Creatinine Ratio: 16 (ref 9–20)
BUN: 17 mg/dL (ref 6–24)
CO2: 25 mmol/L (ref 20–29)
Calcium: 9.3 mg/dL (ref 8.7–10.2)
Chloride: 99 mmol/L (ref 96–106)
Creatinine, Ser: 1.05 mg/dL (ref 0.76–1.27)
GFR calc Af Amer: 95 mL/min/{1.73_m2} (ref >59)
GFR calc non Af Amer: 82 mL/min/{1.73_m2} (ref >59)
Glucose: 151 mg/dL — ABNORMAL HIGH (ref 65–99)
Potassium: 4.9 mmol/L (ref 3.5–5.2)
Sodium: 138 mmol/L (ref 134–144)

## 2019-12-18 LAB — TSH: TSH: 2.93 u[IU]/mL (ref 0.450–4.500)

## 2019-12-20 ENCOUNTER — Encounter: Payer: Self-pay | Admitting: Endocrinology

## 2019-12-20 LAB — HM DIABETES EYE EXAM

## 2019-12-23 ENCOUNTER — Other Ambulatory Visit: Payer: Self-pay | Admitting: Family Medicine

## 2019-12-23 DIAGNOSIS — I10 Essential (primary) hypertension: Secondary | ICD-10-CM

## 2019-12-23 NOTE — Telephone Encounter (Signed)
I have never seen this patient. He needs an ov.

## 2019-12-23 NOTE — Telephone Encounter (Signed)
CM-Plz see refill req/thx dmf 

## 2019-12-24 NOTE — Telephone Encounter (Signed)
Patient has scheduled an appointment with Dr. Ashley Royalty on 12/28/19. He is going to keep him as his primary physician.

## 2019-12-24 NOTE — Telephone Encounter (Signed)
Pt has not been seen in a year by Dr. Matthews/can ya'll plz sched him for an OV with [facility] to continue medications? Thx dmf

## 2019-12-27 ENCOUNTER — Ambulatory Visit: Payer: BC Managed Care – PPO | Admitting: Family Medicine

## 2019-12-28 ENCOUNTER — Ambulatory Visit (INDEPENDENT_AMBULATORY_CARE_PROVIDER_SITE_OTHER): Payer: BC Managed Care – PPO | Admitting: Family Medicine

## 2019-12-28 ENCOUNTER — Encounter: Payer: Self-pay | Admitting: Family Medicine

## 2019-12-28 ENCOUNTER — Other Ambulatory Visit: Payer: Self-pay

## 2019-12-28 DIAGNOSIS — E1165 Type 2 diabetes mellitus with hyperglycemia: Secondary | ICD-10-CM

## 2019-12-28 DIAGNOSIS — R29898 Other symptoms and signs involving the musculoskeletal system: Secondary | ICD-10-CM | POA: Diagnosis not present

## 2019-12-28 DIAGNOSIS — I10 Essential (primary) hypertension: Secondary | ICD-10-CM

## 2019-12-28 DIAGNOSIS — M19011 Primary osteoarthritis, right shoulder: Secondary | ICD-10-CM | POA: Insufficient documentation

## 2019-12-28 MED ORDER — FUROSEMIDE 20 MG PO TABS: ORAL_TABLET | ORAL | 2 refills | Status: DC

## 2019-12-28 MED ORDER — TRAZODONE HCL 50 MG PO TABS: 50.0000 mg | ORAL_TABLET | Freq: Every evening | ORAL | 1 refills | Status: DC | PRN

## 2019-12-28 NOTE — Assessment & Plan Note (Signed)
R shoulder weakness without pain Possible rotator cuff tear.  He will schedule with Dr. Karie Schwalbe

## 2019-12-28 NOTE — Progress Notes (Signed)
Larry Rose - 54 y.o. male MRN [id number]  Date of birth: 02/08/1966  Subjective Chief Complaint  Patient presents with  . Hypertension    HPI Larry Rose is a 54 y.o. male with history of T2DM, HTN, HLD, gout and EtOH overuse here today for follow up visit.   -HTN:  Current management with lisinopril 20mg  daily.  He also takes lasix daily for BP and fluid retention.  He is doing well with current medications and denies side effects.  He denies symptoms related to HTN including chest pain, shortness of breath, palpitations, headache or vision changes.  He has cut back on EtOH consumption.   -T2DM:  Medications managed by endocrinology.  He is taking 150 units of humalog qam,140 in the afternoon and 150 in the evening as well as farxiga.  Last a1c 6.7%.  HE has a new job and is more active.  Weight down about 20lbs over the past 3 months.   -Shoulder weakness:  Reports weakness in R shoulder with decreased ROM.  Hasn't really noted any pain with movement.  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  Denies any other known injury or overuse.    ROS:  A comprehensive ROS was completed and negative except as noted per HPI  Allergies  Allergen Reactions  . Enalapril Maleate   . Lovastatin     Past Medical History:  Diagnosis Date  . ALCOHOLISM 11/30/2009  . ALLERGIC RHINITIS 04/01/2007  . CARPAL TUNNEL SYNDROME, RIGHT 10/06/2008  . DIABETES MELLITUS, TYPE II 04/01/2007  . Hepatic steatosis   . HYPERLIPIDEMIA, ATHEROGENIC 09/29/2007  . HYPERTENSION 04/01/2007  . Rosacea 05/14/2010  . TINEA CRURIS 11/07/2008    Past Surgical History:  Procedure Laterality Date  . carpel tunnel    . FRACTURE SURGERY      Social History   Socioeconomic History  . Marital status: Married    Spouse name: Not on file  . Number of children: Not on file  . Years of education: Not on file  . Highest education level: Not on file  Occupational History  .  Occupation: Works 11/09/2008  . Smoking status: Never Smoker  . Smokeless tobacco: Never Used  Substance and Sexual Activity  . Alcohol use: Not Currently    Comment: quit 6 mths ago  . Drug use: Not Currently  . Sexual activity: Not on file  Other Topics Concern  . Not on file  Social History Narrative  . Not on file   Social Determinants of Health   Financial Resource Strain:   . Difficulty of Paying Living Expenses:   Food Insecurity:   . Worried About Archivist in the Last Year:   . Programme researcher, broadcasting/film/video in the Last Year:   Transportation Needs:   . Barista (Medical):   Freight forwarder Lack of Transportation (Non-Medical):   Physical Activity:   . Days of Exercise per Week:   . Minutes of Exercise per Session:   Stress:   . Feeling of Stress :   Social Connections:   . Frequency of Communication with Friends and Family:   . Frequency of Social Gatherings with Friends and Family:   . Attends Religious Services:   . Active Member of Clubs or Organizations:   . Attends Marland Kitchen Meetings:   Banker Marital Status:     Family History  Problem Relation Age of Onset  .  Heart attack Maternal Grandfather   . Stroke Paternal Grandfather   . Cancer Neg Hx     Health Maintenance  Topic Date Due  . OPHTHALMOLOGY EXAM  12/27/2017  . COVID-19 Vaccine (2 - Pfizer 2-dose series) 01/15/2020  . INFLUENZA VACCINE  03/19/2020  . HEMOGLOBIN A1C  06/17/2020  . FOOT EXAM  12/16/2020  . Fecal DNA (Cologuard)  09/01/2021  . TETANUS/TDAP  06/15/2029  . PNEUMOCOCCAL POLYSACCHARIDE VACCINE AGE 85-64 HIGH RISK  Completed  . HIV Screening  Completed     ----------------------------------------------------------------------------------------------------------------------------------------------------------------------------------------------------------------- Physical Exam BP 116/75 (BP Location: Left Arm, Patient Position: Sitting, Cuff Size:  Large)   Pulse 73   Ht 5' 6.14" (1.68 m)   Wt (!) 316 lb 6.4 oz (143.5 kg)   SpO2 99%   BMI 50.85 kg/m   Physical Exam Constitutional:      Appearance: Normal appearance.  HENT:     Head: Normocephalic and atraumatic.  Eyes:     General: No scleral icterus. Cardiovascular:     Rate and Rhythm: Normal rate and regular rhythm.  Pulmonary:     Effort: Pulmonary effort is normal.     Breath sounds: Normal breath sounds.  Musculoskeletal:     Comments: R shoulder normal to inspection and palpation.  ROM is limited to about 90 degrees abduction/flexion. No pain noted.  Weakness noted with abduction and external rotation.  +empty can for weakness.   Skin:    General: Skin is warm and dry.  Neurological:     General: No focal deficit present.     Mental Status: He is alert.  Psychiatric:        Mood and Affect: Mood normal.        Behavior: Behavior normal.     ------------------------------------------------------------------------------------------------------------------------------------------------------------------------------------------------------------------- Assessment and Plan  Essential hypertension Blood pressure is at goal at for age and co-morbidities.  I recommend he continue current medications.  In addition they were instructed to follow a low sodium diet with regular exercise to help to maintain adequate control of blood pressure.    Diabetes Managed by endocrinology. a1c recently improved.  Encouraged continued weight loss.   Shoulder weakness R shoulder weakness without pain Possible rotator cuff tear.  He will schedule with Dr. Darene Lamer   Meds ordered this encounter  Medications  . traZODone (DESYREL) 50 MG tablet    Sig: Take 1-2 tablets (50-100 mg total) by mouth at bedtime as needed. for sleep    Dispense:  180 tablet    Refill:  1  . furosemide (LASIX) 20 MG tablet    Sig: Take 1qd    Dispense:  90 tablet    Refill:  2    DX Code Needed  .     Return in about 6 months (around 06/29/2020) for HTN.    This visit occurred during the SARS-CoV-2 public health emergency.  Safety protocols were in place, including screening questions prior to the visit, additional usage of staff PPE, and extensive cleaning of exam room while observing appropriate contact time as indicated for disinfecting solutions.

## 2019-12-28 NOTE — Telephone Encounter (Signed)
CM-Plz see refill req/thx dmf 

## 2019-12-28 NOTE — Assessment & Plan Note (Signed)
Managed by endocrinology. a1c recently improved.  Encouraged continued weight loss.

## 2019-12-28 NOTE — Patient Instructions (Signed)
Great to see you! I would recommend that you schedule appt with Dr. Karie Schwalbe for the shoulder Continue current medications.  See me again in 6 months.

## 2019-12-28 NOTE — Assessment & Plan Note (Signed)
Blood pressure is at goal at for age and co-morbidities.  I recommend he continue current medications.  In addition they were instructed to follow a low sodium diet with regular exercise to help to maintain adequate control of blood pressure.   

## 2019-12-31 ENCOUNTER — Ambulatory Visit (INDEPENDENT_AMBULATORY_CARE_PROVIDER_SITE_OTHER): Payer: BC Managed Care – PPO | Admitting: Sports Medicine

## 2019-12-31 ENCOUNTER — Other Ambulatory Visit: Payer: Self-pay

## 2019-12-31 ENCOUNTER — Ambulatory Visit (INDEPENDENT_AMBULATORY_CARE_PROVIDER_SITE_OTHER): Payer: BC Managed Care – PPO

## 2019-12-31 ENCOUNTER — Encounter: Payer: Self-pay | Admitting: Sports Medicine

## 2019-12-31 DIAGNOSIS — R29898 Other symptoms and signs involving the musculoskeletal system: Secondary | ICD-10-CM | POA: Diagnosis not present

## 2019-12-31 MED ORDER — MELOXICAM 15 MG PO TABS: ORAL_TABLET | ORAL | 3 refills | Status: DC

## 2019-12-31 NOTE — Assessment & Plan Note (Signed)
This is a pleasant 54 year old male, for the past 2 weeks he has been doing some pain, he has significant pain over his deltoid and weakness to abduction. On exam he has a positive empty can sign, some impingement signs. Due to his severe weakness to abduction I am concerned about a full-thickness retracted tear of one of his rotator cuff tendons. I did do an ultrasound however the images are of low quality due to the thickness of his skin and subcutaneous tissues. We run proceed with x-ray today, MRI this weekend, home rehab exercises, meloxicam. Return to see me in 6 weeks.

## 2019-12-31 NOTE — Progress Notes (Signed)
    Procedures performed today:    None.  Independent interpretation of notes and tests performed by another provider:   None.  Brief History, Exam, Impression, and Recommendations:    Shoulder weakness This is a pleasant 54 year old male, for the past 2 weeks he has been doing some pain, he has significant pain over his deltoid and weakness to abduction. On exam he has a positive empty can sign, some impingement signs. Due to his severe weakness to abduction I am concerned about a full-thickness retracted tear of one of his rotator cuff tendons. I did do an ultrasound however the images are of low quality due to the thickness of his skin and subcutaneous tissues. We run proceed with x-ray today, MRI this weekend, home rehab exercises, meloxicam. Return to see me in 6 weeks.    ___________________________________________ Larry Rose. [name], M.D., ABFM., CAQSM. Primary Care and Sports Medicine [facility]  Adjunct Instructor of Family Medicine  [employer]

## 2020-01-06 ENCOUNTER — Telehealth: Payer: Self-pay | Admitting: Family Medicine

## 2020-01-06 NOTE — Telephone Encounter (Signed)
FYI: Pt called. He wanted to know if he really needed to have MRI done. I spoke to [facility] and she states that pt needs MRI based on notes in patient's chart about a full- thickness retracted tear.Marland KitchenMarland KitchenPt was advised but wants  to hold off on getting the MRI and will call us back when he is ready to have it done.  Thanks.

## 2020-01-07 NOTE — Telephone Encounter (Signed)
Noted, he can follow-up with me regarding this as needed.

## 2020-01-12 NOTE — Telephone Encounter (Signed)
Finally was able to get in touch with patient. He has decided to go ahead with MRI. I transferred him to Imaging to get it scheduled.

## 2020-01-16 ENCOUNTER — Ambulatory Visit (INDEPENDENT_AMBULATORY_CARE_PROVIDER_SITE_OTHER): Payer: BC Managed Care – PPO

## 2020-01-16 ENCOUNTER — Other Ambulatory Visit: Payer: Self-pay

## 2020-01-16 DIAGNOSIS — R29898 Other symptoms and signs involving the musculoskeletal system: Secondary | ICD-10-CM

## 2020-02-04 ENCOUNTER — Ambulatory Visit (INDEPENDENT_AMBULATORY_CARE_PROVIDER_SITE_OTHER): Payer: BC Managed Care – PPO | Admitting: Sports Medicine

## 2020-02-04 DIAGNOSIS — R202 Paresthesia of skin: Secondary | ICD-10-CM | POA: Diagnosis not present

## 2020-02-04 DIAGNOSIS — M5412 Radiculopathy, cervical region: Secondary | ICD-10-CM | POA: Insufficient documentation

## 2020-02-04 DIAGNOSIS — R200 Anesthesia of skin: Secondary | ICD-10-CM

## 2020-02-04 DIAGNOSIS — M19011 Primary osteoarthritis, right shoulder: Secondary | ICD-10-CM | POA: Diagnosis not present

## 2020-02-04 NOTE — Addendum Note (Signed)
Addended by: Monica Becton on: 02/04/2020 10:21 AM   Modules accepted: Orders

## 2020-02-04 NOTE — Progress Notes (Addendum)
    Procedures performed today:    Procedure: Real-time Ultrasound Guided injection of the right glenohumeral joint Device: Samsung HS60  Verbal informed consent obtained.  Time-out conducted.  Noted no overlying erythema, induration, or other signs of local infection.  Skin prepped in a sterile fashion.  Local anesthesia: Topical Ethyl chloride.  With sterile technique and under real time ultrasound guidance: 1 cc Kenalog 40, 2 cc lidocaine, 2 cc bupivacaine injected easily Completed without difficulty  Pain immediately resolved suggesting accurate placement of the medication.  Advised to call if fevers/chills, erythema, induration, drainage, or persistent bleeding.  Images permanently stored and available for review in the ultrasound unit.  Impression: Technically successful ultrasound guided injection.  Independent interpretation of notes and tests performed by another provider:   None.  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 He has done some rehab exercises, analgesics, and feels a lot better but still has significant pain to flexion. On exam he does have a positive crank test, and weakness to the speeds test, he has no impingement signs today. I think his primary pain generator is the glenohumeral joint, this was injected today under ultrasound guidance, return in 1 month.  Numbness and tingling in right hand Unclear etiology, it does seem to radiate over the dorsum of the forearm to the first, second, and third fingers. On further questioning it is better with abduction of the shoulder, and I am not able to reproduce symptoms with Tinel's or Phalen's sign. I do think we  need to evaluate his cervical spine, adding cervical spine x-rays, cervical spine rehabilitation exercises, return to see me in a month, MRI for interventional planning if no better.    ___________________________________________ Larry Rose. Larry Rose, M.D., ABFM., CAQSM. Primary Care and Sports Medicine [facility]  Adjunct Instructor of Family Medicine  [employer]

## 2020-02-04 NOTE — Assessment & Plan Note (Signed)
Unclear etiology, it does seem to radiate over the dorsum of the forearm to the first, second, and third fingers. On further questioning it is better with abduction of the shoulder, and I am not able to reproduce symptoms with Tinel's or Phalen's sign. I do think we need to evaluate his cervical spine, adding cervical spine x-rays, cervical spine rehabilitation exercises, return to see me in a month, MRI for interventional planning if no better.

## 2020-02-04 NOTE — Assessment & Plan Note (Signed)
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 He has done some rehab exercises, analgesics, and feels a lot better but still has significant pain to flexion. On exam he does have a positive crank test, and weakness to the speeds test, he has no impingement signs today. I think his primary pain generator is the glenohumeral joint, this was injected today under ultrasound guidance, return in 1 month.

## 2020-02-11 ENCOUNTER — Ambulatory Visit: Payer: BC Managed Care – PPO | Admitting: Sports Medicine

## 2020-02-12 ENCOUNTER — Other Ambulatory Visit: Payer: Self-pay | Admitting: Family Medicine

## 2020-02-12 ENCOUNTER — Other Ambulatory Visit: Payer: Self-pay | Admitting: Endocrinology

## 2020-03-03 ENCOUNTER — Other Ambulatory Visit: Payer: Self-pay | Admitting: Sports Medicine

## 2020-03-03 ENCOUNTER — Encounter: Payer: Self-pay | Admitting: Sports Medicine

## 2020-03-03 ENCOUNTER — Other Ambulatory Visit: Payer: Self-pay

## 2020-03-03 ENCOUNTER — Ambulatory Visit (INDEPENDENT_AMBULATORY_CARE_PROVIDER_SITE_OTHER): Payer: BC Managed Care – PPO

## 2020-03-03 ENCOUNTER — Ambulatory Visit (INDEPENDENT_AMBULATORY_CARE_PROVIDER_SITE_OTHER): Payer: BC Managed Care – PPO | Admitting: Sports Medicine

## 2020-03-03 DIAGNOSIS — R200 Anesthesia of skin: Secondary | ICD-10-CM

## 2020-03-03 DIAGNOSIS — R202 Paresthesia of skin: Secondary | ICD-10-CM

## 2020-03-03 DIAGNOSIS — M19011 Primary osteoarthritis, right shoulder: Secondary | ICD-10-CM

## 2020-03-03 MED ORDER — GABAPENTIN 300 MG PO CAPS: ORAL_CAPSULE | ORAL | 3 refills | Status: DC

## 2020-03-03 NOTE — Assessment & Plan Note (Signed)
Larry Rose also has numbness and tingling over the dorsum of his right hand, to the first, second, third fingers, occasional popping in the neck. Negative Tinel's and Phalen signs and having some weakness to extension of the right wrist. He does need to get a cervical spine x-rays, and do his cervical spine rehab exercises, adding some gabapentin. He is hoping to avoid MRI due to the cost. Of note he did have major trauma of his right wrist, he tells me that he had good strength after the trauma and that his weakness is new. I do want some x-rays of his wrist.

## 2020-03-03 NOTE — Progress Notes (Signed)
    Procedures performed today:    None.  Independent interpretation of notes and tests performed by another provider:   None.  Brief History, Exam, Impression, and Recommendations:    Localized, primary osteoarthritis of shoulder region, right This is a pleasant 54 year old male, he had an MRI that showed infraspinatus tendinosis, glenohumeral osteoarthritis, at the last visit we injected the glenohumeral joint and he returns feeling a lot better today.  Numbness and tingling in right hand Romari also has numbness and tingling over the dorsum of his right hand, to the first, second, third fingers, occasional popping in the neck. Negative Tinel's and Phalen signs and having some weakness to extension of the right wrist. He does need to get a cervical spine x-rays, and do his cervical spine rehab exercises, adding some gabapentin. He is hoping to avoid MRI due to the cost. Of note he did have major trauma of his right wrist, he tells me that he had good strength after the trauma and that his weakness is new. I do want some x-rays of his wrist.    ___________________________________________ Ihor Austin. [name], M.D., ABFM., CAQSM. Primary Care and Sports Medicine [facility]  Adjunct Instructor of Family Medicine  [employer]

## 2020-03-03 NOTE — Assessment & Plan Note (Signed)
This is a pleasant 54 year old male, he had an MRI that showed infraspinatus tendinosis, glenohumeral osteoarthritis, at the last visit we injected the glenohumeral joint and he returns feeling a lot better today.

## 2020-03-10 ENCOUNTER — Encounter: Payer: Self-pay | Admitting: Endocrinology

## 2020-03-10 ENCOUNTER — Ambulatory Visit: Payer: BC Managed Care – PPO | Admitting: Endocrinology

## 2020-03-10 ENCOUNTER — Other Ambulatory Visit: Payer: Self-pay

## 2020-03-10 VITALS — BP 118/72 | HR 70 | Wt 313.0 lb

## 2020-03-10 DIAGNOSIS — E119 Type 2 diabetes mellitus without complications: Secondary | ICD-10-CM

## 2020-03-10 LAB — POCT GLYCOSYLATED HEMOGLOBIN (HGB A1C): Hemoglobin A1C: 8.4 % — AB (ref 4.0–5.6)

## 2020-03-10 MED ORDER — HUMULIN R U-500 KWIKPEN 500 UNIT/ML ~~LOC~~ SOPN: 165.0000 [IU] | PEN_INJECTOR | Freq: Three times a day (TID) | SUBCUTANEOUS | 3 refills | Status: DC

## 2020-03-10 MED ORDER — TRULICITY 0.75 MG/0.5ML ~~LOC~~ SOAJ: 0.7500 mg | SUBCUTANEOUS | 11 refills | Status: DC

## 2020-03-10 NOTE — Patient Instructions (Addendum)
I have sent a prescription to your pharmacy, to add "Trulicity."  check your blood sugar twice a day.  vary the time of day when you check, between before the 3 meals, and at bedtime.  also check if you have symptoms of your blood sugar being too high or too low.  please keep a record of the readings and bring it to your next appointment here (or you can bring the meter itself).  You can write it on any piece of paper.  please call us sooner if your blood sugar goes below 70, or if you have a lot of readings over 200.   Please continue the same insulin.   Please come back for a follow-up appointment in 2 months.

## 2020-03-10 NOTE — Progress Notes (Signed)
Subjective:    Patient ID: Larry Rose, male    DOB: 02-05-1966, 54 y.o.   MRN: [id number]  HPI Pt returns for f/u of diabetes mellitus:  DM type: Insulin-requiring type 2 Dx'ed: 1993 Complications: none Therapy: insulin since 2009, and Farxiga.  DKA: never.  Severe hypoglycemia: never.  Pancreatitis: never.  Other: he takes multiple daily injections; he declines weight-loss surgery.   Interval history: pt says he seldom checks cbg, but he takes med as rx'ed.  He takes U-500 Reg, 165 units 3 times a day (just before each meal).   Past Medical History:  Diagnosis Date  . ALCOHOLISM 11/30/2009  . ALLERGIC RHINITIS 04/01/2007  . CARPAL TUNNEL SYNDROME, RIGHT 10/06/2008  . DIABETES MELLITUS, TYPE II 04/01/2007  . Hepatic steatosis   . HYPERLIPIDEMIA, ATHEROGENIC 09/29/2007  . HYPERTENSION 04/01/2007  . Rosacea 05/14/2010  . TINEA CRURIS 11/07/2008    Past Surgical History:  Procedure Laterality Date  . carpel tunnel    . FRACTURE SURGERY      Social History   Socioeconomic History  . Marital status: Married    Spouse name: Not on file  . Number of children: Not on file  . Years of education: Not on file  . Highest education level: Not on file  Occupational History  . Occupation: Works Archivist  . Smoking status: Never Smoker  . Smokeless tobacco: Never Used  Substance and Sexual Activity  . Alcohol use: Not Currently    Comment: quit 6 mths ago  . Drug use: Not Currently  . Sexual activity: Not on file  Other Topics Concern  . Not on file  Social History Narrative  . Not on file   Social Determinants of Health   Financial Resource Strain:   . Difficulty of Paying Living Expenses:   Food Insecurity:   . Worried About Programme researcher, broadcasting/film/video in the Last Year:   . Barista in the Last Year:   Transportation Needs:   . Freight forwarder (Medical):   Marland Kitchen Lack of Transportation (Non-Medical):   Physical Activity:   . Days  of Exercise per Week:   . Minutes of Exercise per Session:   Stress:   . Feeling of Stress :   Social Connections:   . Frequency of Communication with Friends and Family:   . Frequency of Social Gatherings with Friends and Family:   . Attends Religious Services:   . Active Member of Clubs or Organizations:   . Attends Banker Meetings:   Marland Kitchen Marital Status:   Intimate Partner Violence:   . Fear of Current or Ex-Partner:   . Emotionally Abused:   Marland Kitchen Physically Abused:   . Sexually Abused:     Current Outpatient Medications on File Prior to Visit  Medication Sig Dispense Refill  . BD PEN NEEDLE NANO 2ND GEN 32G X 4 MM MISC USE 3 TIMES PER DAY. 90 each 11  . dapagliflozin propanediol (FARXIGA) 5 MG TABS tablet Take 5 mg by mouth daily before breakfast. 30 tablet 11  . furosemide (LASIX) 20 MG tablet Take 1qd 90 tablet 2  . gabapentin (NEURONTIN) 300 MG capsule One tab PO qHS for a week, then BID for a week, then TID. May double weekly to a max of 3,600mg /day 90 capsule 3  . glucose blood (ONE TOUCH ULTRA TEST) test strip 1 each by Other route 2 (two) times daily. And lancets 2/day 250.01 100 each 12  .  lisinopril (ZESTRIL) 10 MG tablet TAKE 2 TABLETS BY MOUTH DAILY *NEEDS APPT FOR REFILLS* 60 tablet 0  . meloxicam (MOBIC) 15 MG tablet One tab PO qAM with a meal for 2 weeks, then daily prn pain. 30 tablet 3  . MITIGARE 0.6 MG CAPS TAKE 2 TABS (1.2MG ) BY MOUTH FOLLOWED 1 TAB 1 HOUR LATER. CONTINUE 1 TAB DAILY UNTIL RESOLUTION. 30 capsule 0  . Multiple Vitamin (MULTIVITAMIN) tablet Take 1 tablet by mouth daily.       No current facility-administered medications on file prior to visit.    Allergies  Allergen Reactions  . Enalapril Maleate   . Lovastatin     Family History  Problem Relation Age of Onset  . Heart attack Maternal Grandfather   . Stroke Paternal Grandfather   . Cancer Neg Hx     BP 118/72   Pulse 70   Wt (!) 313 lb (142 kg)   SpO2 97%   BMI 50.30  kg/m   Review of Systems He has lost weight, due to his efforts.      Objective:   Physical Exam VITAL SIGNS:  See vs page GENERAL: no distress Pulses: dorsalis pedis intact bilat.   MSK: no deformity of the feet CV: 2+ bilat leg edema, and bilat vv's.   Skin:  no ulcer on the feet.  normal color and temp on the feet.  Neuro: sensation is intact to touch on the feet.    Lab Results  Component Value Date   HGBA1C 8.4 (A) 03/10/2020        Assessment & Plan:  Insulin-requiring type 2 DM: worse.  Obesity: adding Trulicity might help.  Patient Instructions  I have sent a prescription to your pharmacy, to add "Trulicity."  check your blood sugar twice a day.  vary the time of day when you check, between before the 3 meals, and at bedtime.  also check if you have symptoms of your blood sugar being too high or too low.  please keep a record of the readings and bring it to your next appointment here (or you can bring the meter itself).  You can write it on any piece of paper.  please call us sooner if your blood sugar goes below 70, or if you have a lot of readings over 200.   Please continue the same insulin.   Please come back for a follow-up appointment in 2 months.

## 2020-03-12 ENCOUNTER — Other Ambulatory Visit: Payer: Self-pay | Admitting: Family Medicine

## 2020-04-06 ENCOUNTER — Ambulatory Visit (INDEPENDENT_AMBULATORY_CARE_PROVIDER_SITE_OTHER): Payer: BC Managed Care – PPO | Admitting: Sports Medicine

## 2020-04-06 DIAGNOSIS — M5412 Radiculopathy, cervical region: Secondary | ICD-10-CM | POA: Diagnosis not present

## 2020-04-06 MED ORDER — TRIAZOLAM 0.25 MG PO TABS: ORAL_TABLET | ORAL | 0 refills | Status: DC

## 2020-04-06 NOTE — Assessment & Plan Note (Signed)
Larry Rose is a very pleasant 54 year old male, he initially presented with numbness and tingling over the dorsum of his right hand to the first, second, third fingers, with radiation from the shoulder all the way down. We treated him conservatively, with cervical spine rehabilitation exercises, gabapentin. His Tinel's and Phalen signs were negative at the time. Of note he did have a distal radius fracture, x-rays look okay with only minimal degenerative changes and borderline space between the scaphoid and the lunate. He has improved slightly with conservative treatment but continues to complain of weakness, numbness and tingling in the right hand and wrist. At this point because of concern for cervical radiculopathy, as well as cervical spondylosis I am going to proceed with a cervical spine MRI, ultimately likely leading to epidural injection. Due to his claustrophobia we are going to add some triazolam prior, he does agree to have a driver.

## 2020-04-06 NOTE — Progress Notes (Addendum)
    Procedures performed today:    None.  Independent interpretation of notes and tests performed by another provider:   Cervical spine MRI personally reviewed, multilevel disc protrusions and areas of stenosis.  Brief History, Exam, Impression, and Recommendations:    Radiculitis of right cervical region Larry Rose is a very pleasant 54 year old male, he initially presented with numbness and tingling over the dorsum of his right hand to the first, second, third fingers, with radiation from the shoulder all the way down. We treated him conservatively, with cervical spine rehabilitation exercises, gabapentin. His Tinel's and Phalen signs were negative at the time. Of note he did have a distal radius fracture, x-rays look okay with only minimal degenerative changes and borderline space between the scaphoid and the lunate. He has improved slightly with conservative treatment but continues to complain of weakness, numbness and tingling in the right hand and wrist. At this point because of concern for cervical radiculopathy, as well as cervical spondylosis I am going to proceed with a cervical spine MRI, ultimately likely leading to epidural injection. Due to his claustrophobia we are going to add some triazolam prior, he does agree to have a driver.    ___________________________________________ Ihor Austin. [name], M.D., ABFM., CAQSM. Primary Care and Sports Medicine [facility]  Adjunct Instructor of Family Medicine  [employer]

## 2020-04-07 ENCOUNTER — Ambulatory Visit: Payer: BC Managed Care – PPO | Admitting: Sports Medicine

## 2020-04-10 ENCOUNTER — Other Ambulatory Visit: Payer: Self-pay

## 2020-04-10 ENCOUNTER — Ambulatory Visit (INDEPENDENT_AMBULATORY_CARE_PROVIDER_SITE_OTHER): Payer: BC Managed Care – PPO

## 2020-04-10 DIAGNOSIS — M5412 Radiculopathy, cervical region: Secondary | ICD-10-CM

## 2020-04-11 ENCOUNTER — Telehealth: Payer: Self-pay | Admitting: Family Medicine

## 2020-04-11 DIAGNOSIS — M5412 Radiculopathy, cervical region: Secondary | ICD-10-CM

## 2020-04-11 NOTE — Telephone Encounter (Signed)
Let Patient know, he is aware and has no issues.

## 2020-04-11 NOTE — Telephone Encounter (Signed)
Appointment note states he is coming in for FMLA. We dont do epidural injections here in our office

## 2020-04-11 NOTE — Telephone Encounter (Signed)
Patient wants to know if he is coming in for an epidural injection for this Thursday? That is what he was wanting in his neck.

## 2020-04-11 NOTE — Telephone Encounter (Signed)
No, I was waiting on him to let me know if he wanted to do it, the epidural will be done here but with Dr. Laurian Brim just up the road, I am going to order it now.

## 2020-04-13 ENCOUNTER — Ambulatory Visit (INDEPENDENT_AMBULATORY_CARE_PROVIDER_SITE_OTHER): Payer: BC Managed Care – PPO | Admitting: Sports Medicine

## 2020-04-13 ENCOUNTER — Telehealth: Payer: Self-pay | Admitting: *Deleted

## 2020-04-13 ENCOUNTER — Other Ambulatory Visit: Payer: Self-pay

## 2020-04-13 DIAGNOSIS — M5412 Radiculopathy, cervical region: Secondary | ICD-10-CM | POA: Diagnosis not present

## 2020-04-13 NOTE — Telephone Encounter (Signed)
Can you look into the referral for this guy? I know that you sent it but the pt called [facility] office this morning and they told him that they've never received a referral and have nothing in their system for him.

## 2020-04-13 NOTE — Telephone Encounter (Signed)
Thank you :)

## 2020-04-13 NOTE — Assessment & Plan Note (Signed)
This is a very pleasant 54 year old male, he initially presented with numbness and tingling over the dorsum of his right hand, first, second, third fingers. We treated him conservatively, failed treatment, ultimately we obtained an MRI that showed multilevel cervical DDD, we are going to proceed with cervical epidural with Dr. Laurian Brim, he is awaiting a call back. Today I filled out some FMLA paperwork keeping him out for a full 3 months, with intermittent leave for appointments.

## 2020-04-13 NOTE — Progress Notes (Signed)
    Procedures performed today:    None.  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  Brief History, Exam, Impression, and Recommendations:    Radiculitis of right cervical region This is a very pleasant 54 year old male, he initially presented with numbness and tingling over the dorsum of his right hand, first, second, third fingers. We treated him conservatively, failed treatment, ultimately we obtained an MRI that showed multilevel cervical DDD, we are going to proceed with cervical epidural with Dr. Laurian Brim, he is awaiting a call back. Today I filled out some FMLA paperwork keeping him out for a full 3 months, with intermittent leave for appointments.    ___________________________________________ Ihor Austin. [name], M.D., ABFM., CAQSM. Primary Care and Sports Medicine [facility]  Adjunct Instructor of Family Medicine  [employer]

## 2020-04-13 NOTE — Telephone Encounter (Signed)
I re faxed the referral because Candise Bowens has not been able to reach anyone in that office today to see if they received it. - CF

## 2020-04-16 ENCOUNTER — Other Ambulatory Visit: Payer: BC Managed Care – PPO

## 2020-04-21 ENCOUNTER — Other Ambulatory Visit: Payer: Self-pay | Admitting: Sports Medicine

## 2020-04-21 DIAGNOSIS — R29898 Other symptoms and signs involving the musculoskeletal system: Secondary | ICD-10-CM

## 2020-04-26 ENCOUNTER — Telehealth: Payer: Self-pay | Admitting: *Deleted

## 2020-04-26 ENCOUNTER — Encounter: Payer: Self-pay | Admitting: Sports Medicine

## 2020-04-26 NOTE — Telephone Encounter (Signed)
Pt left vm wanting you to release him back to full duty. He also said that [facility]'s office has not contacted him from the referral placed on 8/24 so I'm having Victorino Dike look into that AGAIN.

## 2020-04-26 NOTE — Telephone Encounter (Signed)
Letter written for full duty without restrictions.

## 2020-04-27 NOTE — Telephone Encounter (Signed)
Pt notified of letter

## 2020-04-29 ENCOUNTER — Other Ambulatory Visit: Payer: Self-pay | Admitting: Endocrinology

## 2020-05-12 ENCOUNTER — Ambulatory Visit: Payer: BC Managed Care – PPO | Admitting: Endocrinology

## 2020-05-22 ENCOUNTER — Other Ambulatory Visit: Payer: Self-pay | Admitting: Family Medicine

## 2020-05-29 ENCOUNTER — Ambulatory Visit (INDEPENDENT_AMBULATORY_CARE_PROVIDER_SITE_OTHER): Payer: BC Managed Care – PPO | Admitting: Sports Medicine

## 2020-05-29 DIAGNOSIS — M5412 Radiculopathy, cervical region: Secondary | ICD-10-CM

## 2020-05-29 NOTE — Progress Notes (Signed)
    Procedures performed today:    None.  Independent interpretation of notes and tests performed by another provider:   None.  Brief History, Exam, Impression, and Recommendations:    Radiculitis of right cervical region Larry Rose returns, he is a very pleasant 54 year old male, initially presented with numbness tingling over the dorsum of his right hand, after failure of conservative treatment we obtained an MRI that showed multilevel cervical DDD, ultimately he had a cervical epidural with Dr. Laurian Brim 2 to 3 weeks ago, he returns today doing significantly better. At this point he does not feel like he needs shot #2, he can simply call me if he desires epidural #2 out of the 3 shot series. We can do a total of 3 shots in a 52-month period.  Of note he did have an abscess in his right groin, looks like it has drained already, there is some whitish eschar over an open wound. He is on sulfamethoxazole and trimethoprim, I have asked him to go ahead and add warm compresses.    ___________________________________________ Ihor Austin. [name], M.D., ABFM., CAQSM. Primary Care and Sports Medicine [facility]  Adjunct Instructor of Family Medicine  [employer]

## 2020-05-29 NOTE — Assessment & Plan Note (Addendum)
Larry Rose returns, he is a very pleasant 54 year old male, initially presented with numbness tingling over the dorsum of his right hand, after failure of conservative treatment we obtained an MRI that showed multilevel cervical DDD, ultimately he had a cervical epidural with Dr. Laurian Brim 2 to 3 weeks ago, he returns today doing significantly better. At this point he does not feel like he needs shot #2, he can simply call me if he desires epidural #2 out of the 3 shot series. We can do a total of 3 shots in a 51-month period.  Of note he did have an abscess in his right groin, looks like it has drained already, there is some whitish eschar over an open wound. He is on sulfamethoxazole and trimethoprim, I have asked him to go ahead and add warm compresses.

## 2020-06-06 ENCOUNTER — Telehealth: Payer: Self-pay | Admitting: Family Medicine

## 2020-06-06 DIAGNOSIS — M5412 Radiculopathy, cervical region: Secondary | ICD-10-CM

## 2020-06-06 NOTE — Telephone Encounter (Signed)
Pt called and states that he wanted to let Dr T know that he would like to proceed with being set up for a second injection with Dr.O'toole

## 2020-06-06 NOTE — Telephone Encounter (Signed)
No problem, orders placed. 

## 2020-06-14 ENCOUNTER — Other Ambulatory Visit: Payer: Self-pay | Admitting: Family Medicine

## 2020-06-14 NOTE — Telephone Encounter (Signed)
Received a refill request for : Lisinopril 10mg  LR 105/21 (by Dr ) LOV  FOV 06/29/20 (w/ you)  Please review and  Advise. Thanks.  Dm/cma

## 2020-06-20 ENCOUNTER — Ambulatory Visit (INDEPENDENT_AMBULATORY_CARE_PROVIDER_SITE_OTHER): Payer: BC Managed Care – PPO | Admitting: Endocrinology

## 2020-06-20 ENCOUNTER — Other Ambulatory Visit: Payer: Self-pay

## 2020-06-20 ENCOUNTER — Encounter: Payer: Self-pay | Admitting: Endocrinology

## 2020-06-20 VITALS — BP 132/72 | HR 82 | Ht 66.0 in | Wt 316.0 lb

## 2020-06-20 DIAGNOSIS — E119 Type 2 diabetes mellitus without complications: Secondary | ICD-10-CM | POA: Diagnosis not present

## 2020-06-20 LAB — POCT GLYCOSYLATED HEMOGLOBIN (HGB A1C): Hemoglobin A1C: 7.2 % — AB (ref 4.0–5.6)

## 2020-06-20 MED ORDER — TRULICITY 1.5 MG/0.5ML ~~LOC~~ SOAJ: 1.5000 mg | SUBCUTANEOUS | 3 refills | Status: DC

## 2020-06-20 MED ORDER — HUMULIN R U-500 KWIKPEN 500 UNIT/ML ~~LOC~~ SOPN
PEN_INJECTOR | SUBCUTANEOUS | 3 refills | Status: DC
Start: 2020-06-20 — End: 2020-11-02

## 2020-06-20 NOTE — Patient Instructions (Addendum)
I have sent a prescription to your pharmacy, to increase the Trulicity.   Please reduce the regular insulin to 3 times a day (just before each meal) 140-120-140 units.   check your blood sugar twice a day.  vary the time of day when you check, between before the 3 meals, and at bedtime.  also check if you have symptoms of your blood sugar being too high or too low.  please keep a record of the readings and bring it to your next appointment here (or you can bring the meter itself).  You can write it on any piece of paper.  please call us sooner if your blood sugar goes below 70, or if you have a lot of readings over 200.   Please come back for a follow-up appointment in January.

## 2020-06-20 NOTE — Progress Notes (Signed)
Subjective:    Patient ID: Larry Rose, male    DOB: [birth year], 54 y.o.   MRN: [id number]  HPI Pt returns for f/u of diabetes mellitus:  DM type: Insulin-requiring type 2 Dx'ed: 1993 Complications: none Therapy: insulin since 2009, and Farxiga.  DKA: never.  Severe hypoglycemia: never.    Pancreatitis: never.   Other: he takes multiple daily injections; he declines weight-loss surgery.   Interval history: pt says he seldom checks cbg, but he takes meds as rx'ed.  He takes U-500 Reg, 165 units 3 times a day (just before each meal). He received a steroid injection into the neck, 1 month ago.  This increased cbg's for a few days.   He reduced insulin to 160 units 3 times a day (just before each meal), due to mild hypoglycemia in the afternoon.   Past Medical History:  Diagnosis Date  . ALCOHOLISM 11/30/2009  . ALLERGIC RHINITIS 04/01/2007  . CARPAL TUNNEL SYNDROME, RIGHT 10/06/2008  . DIABETES MELLITUS, TYPE II 04/01/2007  . Hepatic steatosis   . HYPERLIPIDEMIA, ATHEROGENIC 09/29/2007  . HYPERTENSION 04/01/2007  . Rosacea 05/14/2010  . TINEA CRURIS 11/07/2008    Past Surgical History:  Procedure Laterality Date  . carpel tunnel    . FRACTURE SURGERY      Social History   Socioeconomic History  . Marital status: Married    Spouse name: Not on file  . Number of children: Not on file  . Years of education: Not on file  . Highest education level: Not on file  Occupational History  . Occupation: Works Archivist  . Smoking status: Never Smoker  . Smokeless tobacco: Never Used  Substance and Sexual Activity  . Alcohol use: Not Currently    Comment: quit 6 mths ago  . Drug use: Not Currently  . Sexual activity: Not on file  Other Topics Concern  . Not on file  Social History Narrative  . Not on file   Social Determinants of Health   Financial Resource Strain:   . Difficulty of Paying Living Expenses: Not on file  Food Insecurity:   .  Worried About Programme researcher, broadcasting/film/video in the Last Year: Not on file  . Ran Out of Food in the Last Year: Not on file  Transportation Needs:   . Lack of Transportation (Medical): Not on file  . Lack of Transportation (Non-Medical): Not on file  Physical Activity:   . Days of Exercise per Week: Not on file  . Minutes of Exercise per Session: Not on file  Stress:   . Feeling of Stress : Not on file  Social Connections:   . Frequency of Communication with Friends and Family: Not on file  . Frequency of Social Gatherings with Friends and Family: Not on file  . Attends Religious Services: Not on file  . Active Member of Clubs or Organizations: Not on file  . Attends Banker Meetings: Not on file  . Marital Status: Not on file  Intimate Partner Violence:   . Fear of Current or Ex-Partner: Not on file  . Emotionally Abused: Not on file  . Physically Abused: Not on file  . Sexually Abused: Not on file    Current Outpatient Medications on File Prior to Visit  Medication Sig Dispense Refill  . BD PEN NEEDLE NANO 2ND GEN 32G X 4 MM MISC USE 3 TIMES PER DAY. 90 each 11  . dapagliflozin propanediol (FARXIGA) 5 MG TABS tablet  Take 5 mg by mouth daily before breakfast. 30 tablet 11  . furosemide (LASIX) 20 MG tablet Take 1qd 90 tablet 2  . glucose blood (ONE TOUCH ULTRA TEST) test strip 1 each by Other route 2 (two) times daily. And lancets 2/day 250.01 100 each 12  . lisinopril (ZESTRIL) 10 MG tablet TAKE 2 TABLETS BY MOUTH DAILY *NEEDS APPT FOR REFILLS* 60 tablet 0  . meloxicam (MOBIC) 15 MG tablet TAKE 1 TABLET EVERY MORNING WITH A MEAL FOR 2 WEEKS,THEN DAILY FOR PAIN 30 tablet 3  . MITIGARE 0.6 MG CAPS TAKE 2 TABS (1.2MG ) BY MOUTH FOLLOWED 1 TAB 1 HOUR LATER. CONTINUE 1 TAB DAILY UNTIL RESOLUTION. 30 capsule 0  . Multiple Vitamin (MULTIVITAMIN) tablet Take 1 tablet by mouth daily.      . triazolam (HALCION) 0.25 MG tablet 1-2 tabs PO 2 hours before procedure or imaging.  Do not drive  with this medication. 2 tablet 0   No current facility-administered medications on file prior to visit.    Allergies  Allergen Reactions  . Enalapril Maleate   . Lovastatin     Family History  Problem Relation Age of Onset  . Heart attack Maternal Grandfather   . Stroke Paternal Grandfather   . Cancer Neg Hx     BP 132/72   Pulse 82   Ht 5\' 6"  (1.676 m)   Wt (!) 316 lb (143.3 kg)   SpO2 94%   BMI 51.00 kg/m    Review of Systems Denies LOC    Objective:   Physical Exam VITAL SIGNS:  See vs page GENERAL: no distress Pulses: dorsalis pedis intact bilat.   MSK: no deformity of the feet CV: 2+ leg edema.   Skin:  no ulcer on the feet.  normal color and temp on the feet.   Neuro: sensation is intact to touch on the feet.     Lab Results  Component Value Date   HGBA1C 7.2 (A) 06/20/2020       Assessment & Plan:  Insulin-requiring type 2 DM.  Hypoglycemia, due to insulin: this limits aggressiveness of glycemic control, so we'll favor GLP rx.   Patient Instructions  I have sent a prescription to your pharmacy, to increase the Trulicity.   Please reduce the regular insulin to 3 times a day (just before each meal) 140-120-140 units.   check your blood sugar twice a day.  vary the time of day when you check, between before the 3 meals, and at bedtime.  also check if you have symptoms of your blood sugar being too high or too low.  please keep a record of the readings and bring it to your next appointment here (or you can bring the meter itself).  You can write it on any piece of paper.  please call 13/09/2019 sooner if your blood sugar goes below 70, or if you have a lot of readings over 200.   Please come back for a follow-up appointment in January.

## 2020-06-29 ENCOUNTER — Ambulatory Visit (INDEPENDENT_AMBULATORY_CARE_PROVIDER_SITE_OTHER): Payer: BC Managed Care – PPO | Admitting: Family Medicine

## 2020-06-29 ENCOUNTER — Encounter: Payer: Self-pay | Admitting: Family Medicine

## 2020-06-29 DIAGNOSIS — K649 Unspecified hemorrhoids: Secondary | ICD-10-CM | POA: Diagnosis not present

## 2020-06-29 DIAGNOSIS — E1165 Type 2 diabetes mellitus with hyperglycemia: Secondary | ICD-10-CM

## 2020-06-29 DIAGNOSIS — I10 Essential (primary) hypertension: Secondary | ICD-10-CM

## 2020-06-29 MED ORDER — HYDROCORTISONE ACETATE 25 MG RE SUPP: 25.0000 mg | Freq: Two times a day (BID) | RECTAL | 0 refills | Status: DC

## 2020-06-29 MED ORDER — LISINOPRIL 10 MG PO TABS
ORAL_TABLET | ORAL | 3 refills | Status: DC
Start: 2020-06-29 — End: 2020-07-09

## 2020-06-29 NOTE — Progress Notes (Signed)
Larry Rose - 54 y.o. male MRN [id number]  Date of birth: 01-21-1966  Subjective Chief Complaint  Patient presents with  . Hypertension    HPI Larry Rose is a 54 y.o. male with history of HTN, T2DM, gout here today for follow up visit.  He has also seen Dr. [name] for shoulder/neck pain.  He recently had ESI with Dr. Laurian Brim which has provided some relief for him.    He is seeing Dr.  Everardo All for management of his diabetes.  HTN is currently managed with lisinopril 10mg  daily.  He is doing well with this.  He denies side effects related to medication.  He has not had chest pain, shortness of breath, palpitations, headache or vision changes.   He has complaint of recurrent hemorrhoids.  Feels like meloxicam is causing some constipation.  He denies significant pain but does have itching and irritation.  He has tried OTC topical creams with temporary relief.    ROS:  A comprehensive ROS was completed and negative except as noted per HPI    Allergies  Allergen Reactions  . Enalapril Maleate   . Lovastatin     Past Medical History:  Diagnosis Date  . ALCOHOLISM 11/30/2009  . ALLERGIC RHINITIS 04/01/2007  . CARPAL TUNNEL SYNDROME, RIGHT 10/06/2008  . DIABETES MELLITUS, TYPE II 04/01/2007  . Hepatic steatosis   . HYPERLIPIDEMIA, ATHEROGENIC 09/29/2007  . HYPERTENSION 04/01/2007  . Rosacea 05/14/2010  . TINEA CRURIS 11/07/2008    Past Surgical History:  Procedure Laterality Date  . carpel tunnel    . FRACTURE SURGERY      Social History   Socioeconomic History  . Marital status: Married    Spouse name: Not on file  . Number of children: Not on file  . Years of education: Not on file  . Highest education level: Not on file  Occupational History  . Occupation: Works 11/09/2008  . Smoking status: Never Smoker  . Smokeless tobacco: Never Used  Substance and Sexual Activity  . Alcohol use: Not Currently    Comment: quit 6 mths  ago  . Drug use: Not Currently  . Sexual activity: Not on file  Other Topics Concern  . Not on file  Social History Narrative  . Not on file   Social Determinants of Health   Financial Resource Strain:   . Difficulty of Paying Living Expenses: Not on file  Food Insecurity:   . Worried About Archivist in the Last Year: Not on file  . Ran Out of Food in the Last Year: Not on file  Transportation Needs:   . Lack of Transportation (Medical): Not on file  . Lack of Transportation (Non-Medical): Not on file  Physical Activity:   . Days of Exercise per Week: Not on file  . Minutes of Exercise per Session: Not on file  Stress:   . Feeling of Stress : Not on file  Social Connections:   . Frequency of Communication with Friends and Family: Not on file  . Frequency of Social Gatherings with Friends and Family: Not on file  . Attends Religious Services: Not on file  . Active Member of Clubs or Organizations: Not on file  . Attends Programme researcher, broadcasting/film/video Meetings: Not on file  . Marital Status: Not on file    Family History  Problem Relation Age of Onset  . Heart attack Maternal Grandfather   . Stroke Paternal Grandfather   . Cancer Neg  Hx     Health Maintenance  Topic Date Due  . Hepatitis C Screening  Never done  . INFLUENZA VACCINE  11/16/2020 (Originally 03/19/2020)  . HEMOGLOBIN A1C  12/18/2020  . OPHTHALMOLOGY EXAM  12/19/2020  . FOOT EXAM  03/10/2021  . Fecal DNA (Cologuard)  09/01/2021  . TETANUS/TDAP  06/15/2029  . PNEUMOCOCCAL POLYSACCHARIDE VACCINE AGE 70-64 HIGH RISK  Completed  . COVID-19 Vaccine  Completed  . HIV Screening  Completed     ----------------------------------------------------------------------------------------------------------------------------------------------------------------------------------------------------------------- Physical Exam BP 131/63 (BP Location: Left Arm, Patient Position: Sitting, Cuff Size: Large)   Pulse 64   Wt  (!) 313 lb (142 kg)   SpO2 99%   BMI 50.52 kg/m   Physical Exam Constitutional:      Appearance: Normal appearance.  HENT:     Head: Normocephalic and atraumatic.  Eyes:     General: No scleral icterus. Cardiovascular:     Rate and Rhythm: Normal rate and regular rhythm.  Pulmonary:     Effort: Pulmonary effort is normal.     Breath sounds: Normal breath sounds.  Skin:    General: Skin is warm and dry.  Neurological:     General: No focal deficit present.     Mental Status: He is alert.  Psychiatric:        Mood and Affect: Mood normal.        Behavior: Behavior normal.     ------------------------------------------------------------------------------------------------------------------------------------------------------------------------------------------------------------------- Assessment and Plan  Essential hypertension Blood pressure is at goal at for age and co-morbidities.  I recommend continuation of lisinopril.  In addition they were instructed to follow a low sodium diet with regular exercise to help to maintain adequate control of blood pressure.    Diabetes Lab Results  Component Value Date   HGBA1C 7.2 (A) 06/20/2020  Managed by endocrinology, stable at this time.   Hemorrhoids Recommend increased dietary fiber and fluids.  Rx for anusol as needed.  Can do sitz bath and tucks wipes as needed.  He will let me know if not improving.    Meds ordered this encounter  Medications  . hydrocortisone (ANUSOL-HC) 25 MG suppository    Sig: Place 1 suppository (25 mg total) rectally 2 (two) times daily.    Dispense:  12 suppository    Refill:  0  . lisinopril (ZESTRIL) 10 MG tablet    Sig: Take 1 tab daily    Dispense:  90 tablet    Refill:  3    Return in about 6 months (around 12/27/2020) for HTN.    This visit occurred during the SARS-CoV-2 public health emergency.  Safety protocols were in place, including screening questions prior to the visit,  additional usage of staff PPE, and extensive cleaning of exam room while observing appropriate contact time as indicated for disinfecting solutions.

## 2020-06-29 NOTE — Assessment & Plan Note (Signed)
Recommend increased dietary fiber and fluids.  Rx for anusol as needed.  Can do sitz bath and tucks wipes as needed.  He will let me know if not improving.

## 2020-06-29 NOTE — Assessment & Plan Note (Signed)
Lab Results  Component Value Date   HGBA1C 7.2 (A) 06/20/2020  Managed by endocrinology, stable at this time.

## 2020-06-29 NOTE — Assessment & Plan Note (Signed)
Blood pressure is at goal at for age and co-morbidities.  I recommend continuation of lisinopril.  In addition they were instructed to follow a low sodium diet with regular exercise to help to maintain adequate control of blood pressure.   

## 2020-06-29 NOTE — Patient Instructions (Signed)

## 2020-07-08 ENCOUNTER — Other Ambulatory Visit: Payer: Self-pay | Admitting: Family Medicine

## 2020-08-09 ENCOUNTER — Other Ambulatory Visit: Payer: Self-pay | Admitting: Sports Medicine

## 2020-08-09 DIAGNOSIS — R29898 Other symptoms and signs involving the musculoskeletal system: Secondary | ICD-10-CM

## 2020-08-29 ENCOUNTER — Other Ambulatory Visit: Payer: Self-pay

## 2020-08-31 ENCOUNTER — Ambulatory Visit: Payer: BC Managed Care – PPO | Admitting: Endocrinology

## 2020-08-31 ENCOUNTER — Encounter: Payer: Self-pay | Admitting: Endocrinology

## 2020-08-31 ENCOUNTER — Other Ambulatory Visit: Payer: Self-pay

## 2020-08-31 VITALS — BP 128/86 | HR 74 | Ht 66.0 in | Wt 309.0 lb

## 2020-08-31 DIAGNOSIS — E119 Type 2 diabetes mellitus without complications: Secondary | ICD-10-CM

## 2020-08-31 LAB — POCT GLYCOSYLATED HEMOGLOBIN (HGB A1C): Hemoglobin A1C: 9 % — AB (ref 4.0–5.6)

## 2020-08-31 MED ORDER — TRULICITY 1.5 MG/0.5ML ~~LOC~~ SOAJ: 1.5000 mg | SUBCUTANEOUS | 3 refills | Status: DC

## 2020-08-31 NOTE — Patient Instructions (Addendum)
I have sent a prescription to your pharmacy, to increase the Trulicity.   Please continue the same insulin and [place].   check your blood sugar twice a day.  vary the time of day when you check, between before the 3 meals, and at bedtime.  also check if you have symptoms of your blood sugar being too high or too low.  please keep a record of the readings and bring it to your next appointment here (or you can bring the meter itself).  You can write it on any piece of paper.  please call us sooner if your blood sugar goes below 70, or if you have a lot of readings over 200.   Please come back for a follow-up appointment in 2 months.

## 2020-08-31 NOTE — Progress Notes (Signed)
Subjective:    Patient ID: Larry Rose, male    DOB: Jan 05, 1966, 55 y.o.   MRN: [id number]  HPI Pt returns for f/u of diabetes mellitus:  DM type: Insulin-requiring type 2 Dx'ed: 1993 Complications: none Therapy: insulin since 2009, and Farxiga.  DKA: never.  Severe hypoglycemia: never.    Pancreatitis: never.   Other: he takes multiple daily injections; he declines weight-loss surgery.   Interval history: pt says he seldom checks cbg, but sat when he does, cbg's are in the 100's. He received another steroid injection into the neck, 1 month ago.  This increased cbg's for a few days.   He has not yet increased the Trulicity.    Past Medical History:  Diagnosis Date  . ALCOHOLISM 11/30/2009  . ALLERGIC RHINITIS 04/01/2007  . CARPAL TUNNEL SYNDROME, RIGHT 10/06/2008  . DIABETES MELLITUS, TYPE II 04/01/2007  . Hepatic steatosis   . HYPERLIPIDEMIA, ATHEROGENIC 09/29/2007  . HYPERTENSION 04/01/2007  . Rosacea 05/14/2010  . TINEA CRURIS 11/07/2008    Past Surgical History:  Procedure Laterality Date  . carpel tunnel    . FRACTURE SURGERY      Social History   Socioeconomic History  . Marital status: Married    Spouse name: Not on file  . Number of children: Not on file  . Years of education: Not on file  . Highest education level: Not on file  Occupational History  . Occupation: Works Archivist  . Smoking status: Never Smoker  . Smokeless tobacco: Never Used  Substance and Sexual Activity  . Alcohol use: Not Currently    Comment: quit 6 mths ago  . Drug use: Not Currently  . Sexual activity: Not on file  Other Topics Concern  . Not on file  Social History Narrative  . Not on file   Social Determinants of Health   Financial Resource Strain: Not on file  Food Insecurity: Not on file  Transportation Needs: Not on file  Physical Activity: Not on file  Stress: Not on file  Social Connections: Not on file  Intimate Partner Violence:  Not on file    Current Outpatient Medications on File Prior to Visit  Medication Sig Dispense Refill  . BD PEN NEEDLE NANO 2ND GEN 32G X 4 MM MISC USE 3 TIMES PER DAY. 90 each 11  . dapagliflozin propanediol (FARXIGA) 5 MG TABS tablet Take 5 mg by mouth daily before breakfast. 30 tablet 11  . furosemide (LASIX) 20 MG tablet Take 1qd 90 tablet 2  . glucose blood (ONE TOUCH ULTRA TEST) test strip 1 each by Other route 2 (two) times daily. And lancets 2/day 250.01 100 each 12  . hydrocortisone (ANUSOL-HC) 25 MG suppository Place 1 suppository (25 mg total) rectally 2 (two) times daily. 12 suppository 0  . insulin regular human CONCENTRATED (HUMULIN R U-500 KWIKPEN) 500 UNIT/ML kwikpen 3 times a day (just before each meal) 140-120-140 units 50 mL 3  . lisinopril (ZESTRIL) 10 MG tablet Take 2 tablets (20 mg total) by mouth daily. 180 tablet 1  . meloxicam (MOBIC) 15 MG tablet TAKE 1 TABLET EVERY MORNING WITH A MEAL FOR 2 WEEKS,THEN DAILY FOR PAIN 30 tablet 3  . MITIGARE 0.6 MG CAPS TAKE 2 TABS (1.2MG ) BY MOUTH FOLLOWED 1 TAB 1 HOUR LATER. CONTINUE 1 TAB DAILY UNTIL RESOLUTION. 30 capsule 0  . Multiple Vitamin (MULTIVITAMIN) tablet Take 1 tablet by mouth daily.    . triazolam (HALCION) 0.25 MG tablet  1-2 tabs PO 2 hours before procedure or imaging.  Do not drive with this medication. 2 tablet 0   No current facility-administered medications on file prior to visit.    Allergies  Allergen Reactions  . Enalapril Maleate   . Lovastatin     Family History  Problem Relation Age of Onset  . Heart attack Maternal Grandfather   . Stroke Paternal Grandfather   . Cancer Neg Hx     BP 128/86   Pulse 74   Ht 5\' 6"  (1.676 m)   Wt (!) 309 lb (140.2 kg)   SpO2 97%   BMI 49.87 kg/m   Review of Systems He denies hypoglycemia.     Objective:   Physical Exam VITAL SIGNS:  See vs page GENERAL: no distress Pulses: dorsalis pedis intact bilat.   MSK: no deformity of the feet CV: 2+ leg edema,  and bilat vv's.     Skin:  no ulcer on the feet.  normal color and temp on the feet.   Neuro: sensation is intact to touch on the feet.     Lab Results  Component Value Date   HGBA1C 9.0 (A) 08/31/2020       Assessment & Plan:  Insulin-requiring type 2 DM: uncontrolled.   Patient Instructions  I have sent a prescription to your pharmacy, to increase the Trulicity.   Please continue the same insulin and 09/02/2020.   check your blood sugar twice a day.  vary the time of day when you check, between before the 3 meals, and at bedtime.  also check if you have symptoms of your blood sugar being too high or too low.  please keep a record of the readings and bring it to your next appointment here (or you can bring the meter itself).  You can write it on any piece of paper.  please call [place] sooner if your blood sugar goes below 70, or if you have a lot of readings over 200.   Please come back for a follow-up appointment in 2 months.

## 2020-09-08 ENCOUNTER — Ambulatory Visit: Payer: BC Managed Care – PPO | Admitting: Endocrinology

## 2020-09-12 ENCOUNTER — Other Ambulatory Visit: Payer: Self-pay | Admitting: Endocrinology

## 2020-11-02 ENCOUNTER — Other Ambulatory Visit: Payer: Self-pay

## 2020-11-02 ENCOUNTER — Ambulatory Visit: Payer: BC Managed Care – PPO | Admitting: Endocrinology

## 2020-11-02 VITALS — BP 124/84 | HR 77 | Ht 69.0 in | Wt 308.8 lb

## 2020-11-02 DIAGNOSIS — E119 Type 2 diabetes mellitus without complications: Secondary | ICD-10-CM

## 2020-11-02 LAB — POCT GLYCOSYLATED HEMOGLOBIN (HGB A1C): Hemoglobin A1C: 7.9 % — AB (ref 4.0–5.6)

## 2020-11-02 MED ORDER — HUMULIN R U-500 KWIKPEN 500 UNIT/ML ~~LOC~~ SOPN: PEN_INJECTOR | SUBCUTANEOUS | 3 refills | Status: DC

## 2020-11-02 MED ORDER — FREESTYLE LIBRE 2 SENSOR MISC: 1.0000 | 3 refills | Status: DC

## 2020-11-02 MED ORDER — FREESTYLE LIBRE 2 READER DEVI: 1.0000 | Freq: Once | 1 refills | Status: AC

## 2020-11-02 NOTE — Patient Instructions (Addendum)
I have sent a prescription to your pharmacy, to increase the insulin to 3 times a day (just before each meal) 150-130-150 units Please continue the same Trulicity and [place].   check your blood sugar twice a day.  vary the time of day when you check, between before the 3 meals, and at bedtime.  also check if you have symptoms of your blood sugar being too high or too low.  please keep a record of the readings and bring it to your next appointment here (or you can bring the meter itself).  You can write it on any piece of paper.  please call us sooner if your blood sugar goes below 70, or if you have a lot of readings over 200.   Please come back for a follow-up appointment in 2 months.

## 2020-11-02 NOTE — Progress Notes (Signed)
Subjective:    Patient ID: Larry Rose, male    DOB: [birth year], 55 y.o.   MRN: [id number]  HPI Pt returns for f/u of diabetes mellitus:  DM type: Insulin-requiring type 2 Dx'ed: 1993 Complications: none Therapy: insulin since 2009, and Farxiga.  DKA: never.  Severe hypoglycemia: never.    Pancreatitis: never.   Other: he takes multiple daily injections; he declines weight-loss surgery.   Interval history: he seldom checks cbg, but states cbg's are in the 100's.  No recent steroids.  He takes meds as rx'ed.   Nausea is mild.   Past Medical History:  Diagnosis Date  . ALCOHOLISM 11/30/2009  . ALLERGIC RHINITIS 04/01/2007  . CARPAL TUNNEL SYNDROME, RIGHT 10/06/2008  . DIABETES MELLITUS, TYPE II 04/01/2007  . Hepatic steatosis   . HYPERLIPIDEMIA, ATHEROGENIC 09/29/2007  . HYPERTENSION 04/01/2007  . Rosacea 05/14/2010  . TINEA CRURIS 11/07/2008    Past Surgical History:  Procedure Laterality Date  . carpel tunnel    . FRACTURE SURGERY      Social History   Socioeconomic History  . Marital status: Married    Spouse name: Not on file  . Number of children: Not on file  . Years of education: Not on file  . Highest education level: Not on file  Occupational History  . Occupation: Works Archivist  . Smoking status: Never Smoker  . Smokeless tobacco: Never Used  Substance and Sexual Activity  . Alcohol use: Not Currently    Comment: quit 6 mths ago  . Drug use: Not Currently  . Sexual activity: Not on file  Other Topics Concern  . Not on file  Social History Narrative  . Not on file   Social Determinants of Health   Financial Resource Strain: Not on file  Food Insecurity: Not on file  Transportation Needs: Not on file  Physical Activity: Not on file  Stress: Not on file  Social Connections: Not on file  Intimate Partner Violence: Not on file    Current Outpatient Medications on File Prior to Visit  Medication Sig Dispense Refill   . BD PEN NEEDLE NANO 2ND GEN 32G X 4 MM MISC USE 3 TIMES PER DAY. 90 each 11  . Dulaglutide (TRULICITY) 1.5 MG/0.5ML SOPN Inject 1.5 mg into the skin once a week. 6 mL 3  . FARXIGA 5 MG TABS tablet TAKE 5 MG BY MOUTH DAILY BEFORE BREAKFAST. 30 tablet 11  . furosemide (LASIX) 20 MG tablet Take 1qd 90 tablet 2  . glucose blood (ONE TOUCH ULTRA TEST) test strip 1 each by Other route 2 (two) times daily. And lancets 2/day 250.01 100 each 12  . hydrocortisone (ANUSOL-HC) 25 MG suppository Place 1 suppository (25 mg total) rectally 2 (two) times daily. 12 suppository 0  . lisinopril (ZESTRIL) 10 MG tablet Take 2 tablets (20 mg total) by mouth daily. 180 tablet 1  . meloxicam (MOBIC) 15 MG tablet TAKE 1 TABLET EVERY MORNING WITH A MEAL FOR 2 WEEKS,THEN DAILY FOR PAIN 30 tablet 3  . MITIGARE 0.6 MG CAPS TAKE 2 TABS (1.2MG ) BY MOUTH FOLLOWED 1 TAB 1 HOUR LATER. CONTINUE 1 TAB DAILY UNTIL RESOLUTION. 30 capsule 0  . Multiple Vitamin (MULTIVITAMIN) tablet Take 1 tablet by mouth daily.    . triazolam (HALCION) 0.25 MG tablet 1-2 tabs PO 2 hours before procedure or imaging.  Do not drive with this medication. 2 tablet 0   No current facility-administered medications on file prior  to visit.    Allergies  Allergen Reactions  . Enalapril Maleate   . Lovastatin     Family History  Problem Relation Age of Onset  . Heart attack Maternal Grandfather   . Stroke Paternal Grandfather   . Cancer Neg Hx     BP 124/84 (BP Location: Right Arm, Patient Position: Sitting, Cuff Size: Large)   Pulse 77   Ht 5\' 9"  (1.753 m)   Wt (!) 308 lb 12.8 oz (140.1 kg)   SpO2 93%   BMI 45.60 kg/m    Review of Systems He denies hypoglycemia    Objective:   Physical Exam VITAL SIGNS:  See vs page GENERAL: no distress Pulses: dorsalis pedis intact bilat.   MSK: no deformity of the feet CV: 2+ leg edema, and bilat vv's.     Skin:  no ulcer on the feet.  normal color and temp on the feet.   Neuro: sensation is  intact to touch on the feet.    Lab Results  Component Value Date   CREATININE 1.05 12/17/2019   BUN 17 12/17/2019   NA 138 12/17/2019   K 4.9 12/17/2019   CL 99 12/17/2019   CO2 25 12/17/2019   Lab Results  Component Value Date   TSH 2.930 12/17/2019     A1c=7.9%    Assessment & Plan:  Insulin-requiring type 2 DM: uncontrolled.  Nausea, due to 12/19/2019: we can't increase now.    Patient Instructions  I have sent a prescription to your pharmacy, to increase the insulin to 3 times a day (just before each meal) 150-130-150 units Please continue the same Trulicity and [place].   check your blood sugar twice a day.  vary the time of day when you check, between before the 3 meals, and at bedtime.  also check if you have symptoms of your blood sugar being too high or too low.  please keep a record of the readings and bring it to your next appointment here (or you can bring the meter itself).  You can write it on any piece of paper.  please call [place] sooner if your blood sugar goes below 70, or if you have a lot of readings over 200.   Please come back for a follow-up appointment in 2 months.

## 2020-12-09 ENCOUNTER — Other Ambulatory Visit: Payer: Self-pay | Admitting: Family Medicine

## 2020-12-09 ENCOUNTER — Other Ambulatory Visit: Payer: Self-pay | Admitting: Sports Medicine

## 2020-12-09 DIAGNOSIS — R29898 Other symptoms and signs involving the musculoskeletal system: Secondary | ICD-10-CM

## 2020-12-09 DIAGNOSIS — I10 Essential (primary) hypertension: Secondary | ICD-10-CM

## 2020-12-27 ENCOUNTER — Ambulatory Visit: Payer: BC Managed Care – PPO | Admitting: Family Medicine

## 2021-01-02 ENCOUNTER — Ambulatory Visit: Payer: BC Managed Care – PPO | Admitting: Endocrinology

## 2021-01-02 ENCOUNTER — Other Ambulatory Visit: Payer: Self-pay

## 2021-01-02 VITALS — BP 100/60 | HR 76 | Ht 69.0 in | Wt 312.0 lb

## 2021-01-02 DIAGNOSIS — E119 Type 2 diabetes mellitus without complications: Secondary | ICD-10-CM

## 2021-01-02 LAB — POCT GLYCOSYLATED HEMOGLOBIN (HGB A1C): Hemoglobin A1C: 7.4 % — AB (ref 4.0–5.6)

## 2021-01-02 MED ORDER — HUMULIN R U-500 KWIKPEN 500 UNIT/ML ~~LOC~~ SOPN: PEN_INJECTOR | SUBCUTANEOUS | 3 refills | Status: DC

## 2021-01-02 MED ORDER — TRULICITY 3 MG/0.5ML ~~LOC~~ SOAJ: 3.0000 mg | SUBCUTANEOUS | 3 refills | Status: DC

## 2021-01-02 NOTE — Progress Notes (Signed)
Subjective:    Patient ID: Larry Rose, male    DOB: November 07, 1965, 55 y.o.   MRN: [id number]  HPI Pt returns for f/u of diabetes mellitus:  DM type: Insulin-requiring type 2 Dx'ed: 1993 Complications: none Therapy: insulin since 2009, Trulicity, and Farxiga.  DKA: never.  Severe hypoglycemia: never.    Pancreatitis: never.   Other: he takes multiple daily injections; he declines weight-loss surgery.   Interval history: he has not recently checked cbg, or used continuous glucose monitor.  No recent steroids.  He takes meds as rx'ed.   Nausea is resolved.  Pt says sometimes misses the lunch insulin, due to a small lunch at work.   Past Medical History:  Diagnosis Date  . ALCOHOLISM 11/30/2009  . ALLERGIC RHINITIS 04/01/2007  . CARPAL TUNNEL SYNDROME, RIGHT 10/06/2008  . DIABETES MELLITUS, TYPE II 04/01/2007  . Hepatic steatosis   . HYPERLIPIDEMIA, ATHEROGENIC 09/29/2007  . HYPERTENSION 04/01/2007  . Rosacea 05/14/2010  . TINEA CRURIS 11/07/2008    Past Surgical History:  Procedure Laterality Date  . carpel tunnel    . FRACTURE SURGERY      Social History   Socioeconomic History  . Marital status: Married    Spouse name: Not on file  . Number of children: Not on file  . Years of education: Not on file  . Highest education level: Not on file  Occupational History  . Occupation: Works Archivist  . Smoking status: Never Smoker  . Smokeless tobacco: Never Used  Substance and Sexual Activity  . Alcohol use: Not Currently    Comment: quit 6 mths ago  . Drug use: Not Currently  . Sexual activity: Not on file  Other Topics Concern  . Not on file  Social History Narrative  . Not on file   Social Determinants of Health   Financial Resource Strain: Not on file  Food Insecurity: Not on file  Transportation Needs: Not on file  Physical Activity: Not on file  Stress: Not on file  Social Connections: Not on file  Intimate Partner Violence:  Not on file    Current Outpatient Medications on File Prior to Visit  Medication Sig Dispense Refill  . Continuous Blood Gluc Sensor (FREESTYLE LIBRE 2 SENSOR) MISC 1 Device by Does not apply route every 14 (fourteen) days. 6 each 3  . FARXIGA 5 MG TABS tablet TAKE 5 MG BY MOUTH DAILY BEFORE BREAKFAST. 30 tablet 11  . furosemide (LASIX) 20 MG tablet TAKE 1 TABLET BY MOUTH EVERY DAY 90 tablet 2  . glucose blood (ONE TOUCH ULTRA TEST) test strip 1 each by Other route 2 (two) times daily. And lancets 2/day 250.01 100 each 12  . hydrocortisone (ANUSOL-HC) 25 MG suppository Place 1 suppository (25 mg total) rectally 2 (two) times daily. 12 suppository 0  . meloxicam (MOBIC) 15 MG tablet TAKE 1 TABLET EVERY MORNING WITH A MEAL FOR 2 WEEKS,THEN DAILY FOR PAIN 30 tablet 3  . MITIGARE 0.6 MG CAPS TAKE 2 TABS (1.2MG ) BY MOUTH FOLLOWED 1 TAB 1 HOUR LATER. CONTINUE 1 TAB DAILY UNTIL RESOLUTION. 30 capsule 0  . Multiple Vitamin (MULTIVITAMIN) tablet Take 1 tablet by mouth daily.    . triazolam (HALCION) 0.25 MG tablet 1-2 tabs PO 2 hours before procedure or imaging.  Do not drive with this medication. 2 tablet 0   No current facility-administered medications on file prior to visit.    Allergies  Allergen Reactions  . Enalapril Maleate   .  Lovastatin     Family History  Problem Relation Age of Onset  . Heart attack Maternal Grandfather   . Stroke Paternal Grandfather   . Cancer Neg Hx     BP 100/60 (BP Location: Right Arm, Patient Position: Sitting, Cuff Size: Large)   Pulse 76   Ht 5\' 9"  (1.753 m)   Wt (!) 312 lb (141.5 kg)   SpO2 96%   BMI 46.07 kg/m    Review of Systems     Objective:   Physical Exam VITAL SIGNS:  See vs page GENERAL: no distress Pulses: dorsalis pedis intact bilat.   MSK: no deformity of the feet CV: 2+ leg edema, and bilat vv's.     Skin:  no ulcer on the feet.  normal color and temp on the feet.   Neuro: sensation is intact to touch on the feet.   Lab  Results  Component Value Date   HGBA1C 7.4 (A) 01/02/2021      Assessment & Plan:  Insulin-requiring type 2 DM: uncontrolled  Patient Instructions  I have sent a prescription to your pharmacy, to increase the insulin to 3 times a day (just before each meal) 150-100-150 units Please continue the same Trulicity and 01/04/2021.   check your blood sugar twice a day.  vary the time of day when you check, between before the 3 meals, and at bedtime.  also check if you have symptoms of your blood sugar being too high or too low.  please keep a record of the readings and bring it to your next appointment here (or you can bring the meter itself).  You can write it on any piece of paper.  please call [place] sooner if your blood sugar goes below 70, or if you have a lot of readings over 200.   Please come back for a follow-up appointment in 3 months.

## 2021-01-02 NOTE — Patient Instructions (Addendum)
I have sent a prescription to your pharmacy, to increase the insulin to 3 times a day (just before each meal) 150-100-150 units Please continue the same Trulicity and [place].   check your blood sugar twice a day.  vary the time of day when you check, between before the 3 meals, and at bedtime.  also check if you have symptoms of your blood sugar being too high or too low.  please keep a record of the readings and bring it to your next appointment here (or you can bring the meter itself).  You can write it on any piece of paper.  please call us sooner if your blood sugar goes below 70, or if you have a lot of readings over 200.   Please come back for a follow-up appointment in 3 months.

## 2021-01-04 ENCOUNTER — Other Ambulatory Visit: Payer: Self-pay | Admitting: Endocrinology

## 2021-01-04 ENCOUNTER — Other Ambulatory Visit: Payer: Self-pay | Admitting: Family Medicine

## 2021-01-04 NOTE — Telephone Encounter (Signed)
Please call and schedule appt for patient. He has not been seen since April 2021.   Sent 30 day refill.   Thanks

## 2021-01-04 NOTE — Telephone Encounter (Signed)
Patient was at work & said he would give a call back to get this appointment scheduled. He was also questioning about blood work, if he could have that done as well. AM

## 2021-02-01 ENCOUNTER — Other Ambulatory Visit: Payer: Self-pay | Admitting: Family Medicine

## 2021-02-01 NOTE — Telephone Encounter (Signed)
Please contact patient for HTN appt. Sending 15 day refill.

## 2021-02-02 NOTE — Telephone Encounter (Signed)
Patient has been scheduled for 02/06/21 with PCP. AM

## 2021-02-06 ENCOUNTER — Ambulatory Visit: Payer: BC Managed Care – PPO | Admitting: Family Medicine

## 2021-02-09 ENCOUNTER — Ambulatory Visit: Payer: BC Managed Care – PPO | Admitting: Family Medicine

## 2021-02-09 ENCOUNTER — Encounter: Payer: Self-pay | Admitting: Family Medicine

## 2021-02-09 ENCOUNTER — Other Ambulatory Visit: Payer: Self-pay

## 2021-02-09 VITALS — BP 128/53 | HR 72 | Temp 97.9°F | Ht 69.0 in | Wt 316.0 lb

## 2021-02-09 DIAGNOSIS — E785 Hyperlipidemia, unspecified: Secondary | ICD-10-CM | POA: Diagnosis not present

## 2021-02-09 DIAGNOSIS — F1010 Alcohol abuse, uncomplicated: Secondary | ICD-10-CM

## 2021-02-09 DIAGNOSIS — I10 Essential (primary) hypertension: Secondary | ICD-10-CM

## 2021-02-09 DIAGNOSIS — E1165 Type 2 diabetes mellitus with hyperglycemia: Secondary | ICD-10-CM | POA: Diagnosis not present

## 2021-02-09 MED ORDER — LISINOPRIL 20 MG PO TABS: 20.0000 mg | ORAL_TABLET | Freq: Every day | ORAL | 3 refills | Status: DC

## 2021-02-09 NOTE — Assessment & Plan Note (Signed)
Counseled on cutting back on EtOH intake.  

## 2021-02-09 NOTE — Assessment & Plan Note (Signed)
Blood pressure is at goal at for age and co-morbidities.  I recommend continuation of current medication.  In addition they were instructed to follow a low sodium diet with regular exercise to help to maintain adequate control of blood pressure.   

## 2021-02-09 NOTE — Assessment & Plan Note (Signed)
Management per endocrinology.

## 2021-02-09 NOTE — Assessment & Plan Note (Signed)
Update lipid panel.  

## 2021-02-09 NOTE — Progress Notes (Signed)
Larry Rose - 55 y.o. male MRN [id number]  Date of birth: October 23, 1965  Subjective Chief Complaint  Patient presents with   Hypertension    HPI Larry Rose is a 55 y.o. male here today for follow up of HTN and HLD. Reports that he is doing OK.  He has been seeing Dr. [name] for shoulder pain and cervical radiculopathy.  Had injection w/ Dr. Laurian Brim which provided some relief for a little while but he is starting to have some increased symptoms again.   He has had statin intolerance in the past.    He continues to see endocrinology for management of diabetes.  This remains uncontrolled.  He does continue to drink fairly heavily.    ROS:  A comprehensive ROS was completed and negative except as noted per HPI  Allergies  Allergen Reactions   Enalapril Maleate    Lovastatin     Past Medical History:  Diagnosis Date   ALCOHOLISM 11/30/2009   ALLERGIC RHINITIS 04/01/2007   CARPAL TUNNEL SYNDROME, RIGHT 10/06/2008   DIABETES MELLITUS, TYPE II 04/01/2007   Hepatic steatosis    HYPERLIPIDEMIA, ATHEROGENIC 09/29/2007   HYPERTENSION 04/01/2007   Rosacea 05/14/2010   TINEA CRURIS 11/07/2008    Past Surgical History:  Procedure Laterality Date   carpel tunnel     FRACTURE SURGERY      Social History   Socioeconomic History   Marital status: Married    Spouse name: Not on file   Number of children: Not on file   Years of education: Not on file   Highest education level: Not on file  Occupational History   Occupation: Works Quarry manager  Tobacco Use   Smoking status: Never   Smokeless tobacco: Never  Substance and Sexual Activity   Alcohol use: Not Currently    Comment: quit 6 mths ago   Drug use: Not Currently   Sexual activity: Not on file  Other Topics Concern   Not on file  Social History Narrative   Not on file   Social Determinants of Health   Financial Resource Strain: Not on file  Food Insecurity: Not on file  Transportation Needs:  Not on file  Physical Activity: Not on file  Stress: Not on file  Social Connections: Not on file    Family History  Problem Relation Age of Onset   Heart attack Maternal Grandfather    Stroke Paternal Grandfather    Cancer Neg Hx     Health Maintenance  Topic Date Due   Pneumococcal Vaccine 85-15 Years old (1 - PCV) Never done   Hepatitis C Screening  Never done   Zoster Vaccines- Shingrix (1 of 2) Never done   COVID-19 Vaccine (4 - Booster for Pfizer series) 11/05/2020   OPHTHALMOLOGY EXAM  12/19/2020   INFLUENZA VACCINE  03/19/2021   HEMOGLOBIN A1C  07/05/2021   Fecal DNA (Cologuard)  09/01/2021   FOOT EXAM  01/02/2022   TETANUS/TDAP  06/15/2029   PNEUMOCOCCAL POLYSACCHARIDE VACCINE AGE 60-64 HIGH RISK  Completed   HIV Screening  Completed   HPV VACCINES  Aged Out     ----------------------------------------------------------------------------------------------------------------------------------------------------------------------------------------------------------------- Physical Exam BP (!) 128/53 (BP Location: Left Arm, Patient Position: Sitting, Cuff Size: Large)   Pulse 72   Temp 97.9 F (36.6 C)   Ht 5\' 9"  (1.753 m)   Wt (!) 316 lb (143.3 kg)   SpO2 92%   BMI 46.67 kg/m   Physical Exam Constitutional:      Appearance: Normal  appearance.  HENT:     Head: Normocephalic and atraumatic.  Eyes:     General: No scleral icterus. Cardiovascular:     Rate and Rhythm: Normal rate and regular rhythm.  Pulmonary:     Effort: Pulmonary effort is normal.     Breath sounds: Normal breath sounds.  Musculoskeletal:     Cervical back: Neck supple.  Neurological:     General: No focal deficit present.     Mental Status: He is alert.  Psychiatric:        Mood and Affect: Mood normal.        Behavior: Behavior normal.     ------------------------------------------------------------------------------------------------------------------------------------------------------------------------------------------------------------------- Assessment and Plan  Diabetes Management per endocrinology.   Essential hypertension Blood pressure is at goal at for age and co-morbidities.  I recommend continuation of current medication.  In addition they were instructed to follow a low sodium diet with regular exercise to help to maintain adequate control of blood pressure.    Alcohol use disorder, mild, abuse Counseled on cutting back on EtOH intake.    Dyslipidemia Update lipid panel.   Meds ordered this encounter  Medications   lisinopril (ZESTRIL) 20 MG tablet    Sig: Take 1 tablet (20 mg total) by mouth daily.    Dispense:  90 tablet    Refill:  3    Return in about 6 months (around 08/11/2021) for HTN.    This visit occurred during the SARS-CoV-2 public health emergency.  Safety protocols were in place, including screening questions prior to the visit, additional usage of staff PPE, and extensive cleaning of exam room while observing appropriate contact time as indicated for disinfecting solutions.

## 2021-02-09 NOTE — Patient Instructions (Signed)
Nice to see you today! Continue current medications.  Work on cutting back on alcohol intake.  This should help with weight and triglyceride levels.  Let's plan to follow up in 6 months or sooner if needed.

## 2021-02-10 ENCOUNTER — Other Ambulatory Visit: Payer: Self-pay | Admitting: Family Medicine

## 2021-02-10 LAB — CBC WITH DIFFERENTIAL/PLATELET
Absolute Monocytes: 352 cells/uL (ref 200–950)
Basophils Absolute: 19 cells/uL (ref 0–200)
Basophils Relative: 0.3 %
Eosinophils Absolute: 320 cells/uL (ref 15–500)
Eosinophils Relative: 5 %
HCT: 46.7 % (ref 38.5–50.0)
Hemoglobin: 15.8 g/dL (ref 13.2–17.1)
Lymphs Abs: 1651 cells/uL (ref 850–3900)
MCH: 31.7 pg (ref 27.0–33.0)
MCHC: 33.8 g/dL (ref 32.0–36.0)
MCV: 93.8 fL (ref 80.0–100.0)
MPV: 11.3 fL (ref 7.5–12.5)
Monocytes Relative: 5.5 %
Neutro Abs: 4058 cells/uL (ref 1500–7800)
Neutrophils Relative %: 63.4 %
Platelets: 209 10*3/uL (ref 140–400)
RBC: 4.98 10*6/uL (ref 4.20–5.80)
RDW: 13 % (ref 11.0–15.0)
Total Lymphocyte: 25.8 %
WBC: 6.4 10*3/uL (ref 3.8–10.8)

## 2021-02-10 LAB — COMPREHENSIVE METABOLIC PANEL
AG Ratio: 1.7 (calc) (ref 1.0–2.5)
ALT: 36 U/L (ref 9–46)
AST: 30 U/L (ref 10–35)
Albumin: 4.2 g/dL (ref 3.6–5.1)
Alkaline phosphatase (APISO): 56 U/L (ref 35–144)
BUN: 12 mg/dL (ref 7–25)
CO2: 31 mmol/L (ref 20–32)
Calcium: 9.5 mg/dL (ref 8.6–10.3)
Chloride: 98 mmol/L (ref 98–110)
Creat: 0.71 mg/dL (ref 0.70–1.33)
Globulin: 2.5 g/dL (calc) (ref 1.9–3.7)
Glucose, Bld: 150 mg/dL — ABNORMAL HIGH (ref 65–99)
Potassium: 5 mmol/L (ref 3.5–5.3)
Sodium: 139 mmol/L (ref 135–146)
Total Bilirubin: 0.5 mg/dL (ref 0.2–1.2)
Total Protein: 6.7 g/dL (ref 6.1–8.1)

## 2021-02-10 LAB — LIPID PANEL W/REFLEX DIRECT LDL
Cholesterol: 237 mg/dL — ABNORMAL HIGH (ref <200)
HDL: 40 mg/dL (ref >=40)
Non-HDL Cholesterol (Calc): 197 mg/dL (calc) — ABNORMAL HIGH (ref <130)
Total CHOL/HDL Ratio: 5.9 (calc) — ABNORMAL HIGH (ref <5.0)
Triglycerides: 514 mg/dL — ABNORMAL HIGH (ref <150)

## 2021-02-10 LAB — DIRECT LDL: Direct LDL: 112 mg/dL — ABNORMAL HIGH (ref <100)

## 2021-02-20 ENCOUNTER — Other Ambulatory Visit: Payer: Self-pay | Admitting: Family Medicine

## 2021-04-06 ENCOUNTER — Other Ambulatory Visit: Payer: Self-pay | Admitting: Sports Medicine

## 2021-04-06 DIAGNOSIS — R29898 Other symptoms and signs involving the musculoskeletal system: Secondary | ICD-10-CM

## 2021-04-13 ENCOUNTER — Ambulatory Visit: Payer: BC Managed Care – PPO | Admitting: Endocrinology

## 2021-04-13 ENCOUNTER — Other Ambulatory Visit: Payer: Self-pay

## 2021-04-13 VITALS — BP 100/40 | HR 91 | Ht 69.0 in | Wt 312.2 lb

## 2021-04-13 DIAGNOSIS — E119 Type 2 diabetes mellitus without complications: Secondary | ICD-10-CM

## 2021-04-13 LAB — POCT GLYCOSYLATED HEMOGLOBIN (HGB A1C): Hemoglobin A1C: 7.1 % — AB (ref 4.0–5.6)

## 2021-04-13 MED ORDER — CONTOUR NEXT TEST VI STRP: 1.0000 | ORAL_STRIP | Freq: Two times a day (BID) | 3 refills | Status: DC

## 2021-04-13 MED ORDER — CONTOUR NEXT ONE DEVI: 1.0000 | Freq: Once | 0 refills | Status: AC

## 2021-04-13 NOTE — Progress Notes (Signed)
Subjective:    Patient ID: Larry Rose, male    DOB: 10/30/1965, 55 y.o.   MRN: [id number]  HPI Pt returns for f/u of diabetes mellitus:  DM type: Insulin-requiring type 2 Dx'ed: 1993 Complications: none Therapy: insulin since 2009, Trulicity, and Farxiga.  DKA: never.  Severe hypoglycemia: never.    Pancreatitis: never.   Other: he takes multiple daily injections; he declines weight-loss surgery.   Interval history: he has not recently checked cbg, or used continuous glucose monitor.  No recent steroids.  Pt says work schedule is limiting his ability to take multiple daily injections.   Past Medical History:  Diagnosis Date   ALCOHOLISM 11/30/2009   ALLERGIC RHINITIS 04/01/2007   CARPAL TUNNEL SYNDROME, RIGHT 10/06/2008   DIABETES MELLITUS, TYPE II 04/01/2007   Hepatic steatosis    HYPERLIPIDEMIA, ATHEROGENIC 09/29/2007   HYPERTENSION 04/01/2007   Rosacea 05/14/2010   TINEA CRURIS 11/07/2008    Past Surgical History:  Procedure Laterality Date   carpel tunnel     FRACTURE SURGERY      Social History   Socioeconomic History   Marital status: Married    Spouse name: Not on file   Number of children: Not on file   Years of education: Not on file   Highest education level: Not on file  Occupational History   Occupation: Works Quarry manager  Tobacco Use   Smoking status: Never   Smokeless tobacco: Never  Substance and Sexual Activity   Alcohol use: Not Currently    Comment: quit 6 mths ago   Drug use: Not Currently   Sexual activity: Not on file  Other Topics Concern   Not on file  Social History Narrative   Not on file   Social Determinants of Health   Financial Resource Strain: Not on file  Food Insecurity: Not on file  Transportation Needs: Not on file  Physical Activity: Not on file  Stress: Not on file  Social Connections: Not on file  Intimate Partner Violence: Not on file    Current Outpatient Medications on File Prior to Visit   Medication Sig Dispense Refill   BD PEN NEEDLE NANO 2ND GEN 32G X 4 MM MISC USE 3 TIMES PER DAY. 100 each 10   Continuous Blood Gluc Sensor (FREESTYLE LIBRE 2 SENSOR) MISC 1 Device by Does not apply route every 14 (fourteen) days. 6 each 3   Dulaglutide (TRULICITY) 3 MG/0.5ML SOPN Inject 3 mg as directed once a week. 6 mL 3   FARXIGA 5 MG TABS tablet TAKE 5 MG BY MOUTH DAILY BEFORE BREAKFAST. 30 tablet 11   furosemide (LASIX) 20 MG tablet TAKE 1 TABLET BY MOUTH EVERY DAY 90 tablet 2   hydrocortisone (ANUSOL-HC) 25 MG suppository Place 1 suppository (25 mg total) rectally 2 (two) times daily. 12 suppository 0   insulin regular human CONCENTRATED (HUMULIN R U-500 KWIKPEN) 500 UNIT/ML kwikpen 3 times a day (just before each meal) 150-100-150 units 50 mL 3   lisinopril (ZESTRIL) 10 MG tablet TAKE 2 TABLETS BY MOUTH EVERY DAY 30 tablet 0   lisinopril (ZESTRIL) 20 MG tablet Take 1 tablet (20 mg total) by mouth daily. 90 tablet 3   meloxicam (MOBIC) 15 MG tablet TAKE 1 TABLET EVERY MORNING WITH A MEAL FOR 2 WEEKS,THEN DAILY FOR PAIN 30 tablet 3   MITIGARE 0.6 MG CAPS TAKE 2 TABS (1.2MG ) BY MOUTH FOLLOWED 1 TAB 1 HOUR LATER. CONTINUE 1 TAB DAILY UNTIL RESOLUTION. 30 capsule 0  Multiple Vitamin (MULTIVITAMIN) tablet Take 1 tablet by mouth daily.     No current facility-administered medications on file prior to visit.    Allergies  Allergen Reactions   Enalapril Maleate    Lovastatin     Family History  Problem Relation Age of Onset   Heart attack Maternal Grandfather    Stroke Paternal Grandfather    Cancer Neg Hx     BP (!) 100/40 (BP Location: Right Arm, Patient Position: Sitting, Cuff Size: Large)   Pulse 91   Ht 5\' 9"  (1.753 m)   Wt (!) 312 lb 3.2 oz (141.6 kg)   SpO2 96%   BMI 46.10 kg/m    Review of Systems Denies n/v/hb.  He denies hypoglycemia sxs.     Objective:   Physical Exam Pulses: dorsalis pedis intact bilat.   MSK: no deformity of the feet CV: 2+ leg edema, and  bilat vv's.     Skin:  no ulcer on the feet.  normal color and temp on the feet.   Neuro: sensation is intact to touch on the feet.    Lab Results  Component Value Date   HGBA1C 7.1 (A) 04/13/2021       Assessment & Plan:  Insulin-requiring type 2 DM: uncontrolled Obesity: uncontrolled  Patient Instructions  Let's increase the Trulicity.  You can use up what you have by taking 1 shot every 5 days. When you run out, you should consider changing to an alternative called "Mounjaro."  Please let 04/15/2021 know then.     Please continue the same insulin and [place].   check your blood sugar twice a day.  vary the time of day when you check, between before the 3 meals, and at bedtime.  also check if you have symptoms of your blood sugar being too high or too low.  please keep a record of the readings and bring it to your next appointment here (or you can bring the meter itself).  You can write it on any piece of paper.  please call [place] sooner if your blood sugar goes below 70, or if you have a lot of readings over 200.   Please come back for a follow-up appointment in 3 months.

## 2021-04-13 NOTE — Patient Instructions (Addendum)
Let's increase the Trulicity.  You can use up what you have by taking 1 shot every 5 days. When you run out, you should consider changing to an alternative called "Mounjaro."  Please let us know then.     Please continue the same insulin and [place].   check your blood sugar twice a day.  vary the time of day when you check, between before the 3 meals, and at bedtime.  also check if you have symptoms of your blood sugar being too high or too low.  please keep a record of the readings and bring it to your next appointment here (or you can bring the meter itself).  You can write it on any piece of paper.  please call us sooner if your blood sugar goes below 70, or if you have a lot of readings over 200.   Please come back for a follow-up appointment in 3 months.

## 2021-05-18 ENCOUNTER — Telehealth: Payer: Self-pay | Admitting: Endocrinology

## 2021-05-18 NOTE — Telephone Encounter (Signed)
Patient called re: Patient states Dr. Everardo All told Patient to call and let Dr. Everardo All know when Patient is low on Trulicity (which Patient is low on Trulicity) so that Dr. Everardo All could change the medication/RX to a different medication.  Patient states PHARM to send new RX is:  [id number] - [place], [place] - [address] AT [facility] [address] Phone:  [phone]  Fax:  [phone]

## 2021-05-21 ENCOUNTER — Other Ambulatory Visit: Payer: Self-pay | Admitting: Endocrinology

## 2021-05-21 MED ORDER — TIRZEPATIDE 10 MG/0.5ML ~~LOC~~ SOAJ: 10.0000 mg | SUBCUTANEOUS | 11 refills | Status: DC

## 2021-05-29 ENCOUNTER — Encounter: Payer: Self-pay | Admitting: Endocrinology

## 2021-05-31 ENCOUNTER — Other Ambulatory Visit: Payer: Self-pay | Admitting: Endocrinology

## 2021-06-01 ENCOUNTER — Other Ambulatory Visit: Payer: Self-pay | Admitting: Endocrinology

## 2021-06-01 MED ORDER — TRULICITY 3 MG/0.5ML ~~LOC~~ SOAJ: 3.0000 mg | SUBCUTANEOUS | 3 refills | Status: DC

## 2021-06-01 NOTE — Telephone Encounter (Signed)
Message sent thru MyChart to continue trulicity

## 2021-06-01 NOTE — Telephone Encounter (Signed)
Patient notified

## 2021-07-20 ENCOUNTER — Ambulatory Visit: Payer: BC Managed Care – PPO | Admitting: Endocrinology

## 2021-08-02 ENCOUNTER — Other Ambulatory Visit: Payer: Self-pay

## 2021-08-02 ENCOUNTER — Ambulatory Visit: Payer: BC Managed Care – PPO | Admitting: Endocrinology

## 2021-08-02 VITALS — BP 138/80 | HR 81 | Ht 69.0 in | Wt 311.4 lb

## 2021-08-02 DIAGNOSIS — E119 Type 2 diabetes mellitus without complications: Secondary | ICD-10-CM

## 2021-08-02 LAB — POCT GLYCOSYLATED HEMOGLOBIN (HGB A1C): Hemoglobin A1C: 7.8 % — AB (ref 4.0–5.6)

## 2021-08-02 MED ORDER — TRULICITY 4.5 MG/0.5ML ~~LOC~~ SOAJ: 4.5000 mg | SUBCUTANEOUS | 3 refills | Status: DC

## 2021-08-02 NOTE — Patient Instructions (Addendum)
Let's increase the Trulicity.  You can use up what you have by taking 1 shot every 5 days.   Please continue the same insulin and [place].   check your blood sugar twice a day.  vary the time of day when you check, between before the 3 meals, and at bedtime.  also check if you have symptoms of your blood sugar being too high or too low.  please keep a record of the readings and bring it to your next appointment here (or you can bring the meter itself).  You can write it on any piece of paper.  please call us sooner if your blood sugar goes below 70, or if you have a lot of readings over 200.   Please come back for a follow-up appointment in 3 months.

## 2021-08-02 NOTE — Progress Notes (Signed)
Subjective:    Patient ID: Larry Rose, male    DOB: [birth year], 55 y.o.   MRN: [id number]  HPI Pt returns for f/u of diabetes mellitus:  DM type: Insulin-requiring type 2 Dx'ed: 1993 Complications: none Therapy: insulin since 2009, Trulicity, and Farxiga.  DKA: never.  Severe hypoglycemia: never.    Pancreatitis: never.   Other: he takes multiple daily injections; he declines weight-loss surgery.   Interval history: he has not recently checked cbg, or used continuous glucose monitor.  No recent steroids.  Ins declined continuous glucose monitor.  He misses Trulicity x 3 weeks, due to lack of availability.  Ins declined Mounjaro. He works 2PM-11PM.   Past Medical History:  Diagnosis Date   ALCOHOLISM 11/30/2009   ALLERGIC RHINITIS 04/01/2007   CARPAL TUNNEL SYNDROME, RIGHT 10/06/2008   DIABETES MELLITUS, TYPE II 04/01/2007   Hepatic steatosis    HYPERLIPIDEMIA, ATHEROGENIC 09/29/2007   HYPERTENSION 04/01/2007   Rosacea 05/14/2010   TINEA CRURIS 11/07/2008    Past Surgical History:  Procedure Laterality Date   carpel tunnel     FRACTURE SURGERY      Social History   Socioeconomic History   Marital status: Married    Spouse name: Not on file   Number of children: Not on file   Years of education: Not on file   Highest education level: Not on file  Occupational History   Occupation: Works Quarry manager  Tobacco Use   Smoking status: Never   Smokeless tobacco: Never  Substance and Sexual Activity   Alcohol use: Not Currently    Comment: quit 6 mths ago   Drug use: Not Currently   Sexual activity: Not on file  Other Topics Concern   Not on file  Social History Narrative   Not on file   Social Determinants of Health   Financial Resource Strain: Not on file  Food Insecurity: Not on file  Transportation Needs: Not on file  Physical Activity: Not on file  Stress: Not on file  Social Connections: Not on file  Intimate Partner Violence: Not on file     Current Outpatient Medications on File Prior to Visit  Medication Sig Dispense Refill   BD PEN NEEDLE NANO 2ND GEN 32G X 4 MM MISC USE 3 TIMES PER DAY. 100 each 10   Continuous Blood Gluc Sensor (FREESTYLE LIBRE 2 SENSOR) MISC 1 Device by Does not apply route every 14 (fourteen) days. 6 each 3   FARXIGA 5 MG TABS tablet TAKE 5 MG BY MOUTH DAILY BEFORE BREAKFAST. 30 tablet 11   furosemide (LASIX) 20 MG tablet TAKE 1 TABLET BY MOUTH EVERY DAY 90 tablet 2   glucose blood (CONTOUR NEXT TEST) test strip 1 each by Other route in the morning and at bedtime. And lancets 3/day 200 each 3   hydrocortisone (ANUSOL-HC) 25 MG suppository Place 1 suppository (25 mg total) rectally 2 (two) times daily. 12 suppository 0   insulin regular human CONCENTRATED (HUMULIN R U-500 KWIKPEN) 500 UNIT/ML kwikpen 3 times a day (just before each meal) 150-100-150 units 50 mL 3   lisinopril (ZESTRIL) 10 MG tablet TAKE 2 TABLETS BY MOUTH EVERY DAY 30 tablet 0   lisinopril (ZESTRIL) 20 MG tablet Take 1 tablet (20 mg total) by mouth daily. 90 tablet 3   meloxicam (MOBIC) 15 MG tablet TAKE 1 TABLET EVERY MORNING WITH A MEAL FOR 2 WEEKS,THEN DAILY FOR PAIN 30 tablet 3   MITIGARE 0.6 MG CAPS TAKE 2 TABS (  1.2MG ) BY MOUTH FOLLOWED 1 TAB 1 HOUR LATER. CONTINUE 1 TAB DAILY UNTIL RESOLUTION. 30 capsule 0   Multiple Vitamin (MULTIVITAMIN) tablet Take 1 tablet by mouth daily.     No current facility-administered medications on file prior to visit.    Allergies  Allergen Reactions   Enalapril Maleate    Lovastatin     Family History  Problem Relation Age of Onset   Heart attack Maternal Grandfather    Stroke Paternal Grandfather    Cancer Neg Hx     BP 138/80    Pulse 81    Ht 5\' 9"  (1.753 m)    Wt (!) 311 lb 6.4 oz (141.3 kg)    SpO2 96%    BMI 45.99 kg/m   Review of Systems     Objective:   Physical Exam    A1c=7.8%    Assessment & Plan:  Insulin-requiring type 2 DM: uncontrolled, due to med  availability  Patient Instructions  Let's increase the Trulicity.  You can use up what you have by taking 1 shot every 5 days.   Please continue the same insulin and .   check your blood sugar twice a day.  vary the time of day when you check, between before the 3 meals, and at bedtime.  also check if you have symptoms of your blood sugar being too high or too low.  please keep a record of the readings and bring it to your next appointment here (or you can bring the meter itself).  You can write it on any piece of paper.  please call [place] sooner if your blood sugar goes below 70, or if you have a lot of readings over 200.   Please come back for a follow-up appointment in 3 months.

## 2021-08-08 ENCOUNTER — Other Ambulatory Visit: Payer: Self-pay | Admitting: Sports Medicine

## 2021-08-08 DIAGNOSIS — R29898 Other symptoms and signs involving the musculoskeletal system: Secondary | ICD-10-CM

## 2021-08-14 ENCOUNTER — Encounter: Payer: Self-pay | Admitting: Family Medicine

## 2021-08-14 ENCOUNTER — Ambulatory Visit: Payer: BC Managed Care – PPO | Admitting: Family Medicine

## 2021-08-14 ENCOUNTER — Other Ambulatory Visit: Payer: Self-pay

## 2021-08-14 VITALS — BP 129/59 | HR 79 | Ht 69.0 in | Wt 311.0 lb

## 2021-08-14 DIAGNOSIS — Z1211 Encounter for screening for malignant neoplasm of colon: Secondary | ICD-10-CM

## 2021-08-14 DIAGNOSIS — Z23 Encounter for immunization: Secondary | ICD-10-CM | POA: Diagnosis not present

## 2021-08-14 DIAGNOSIS — E1165 Type 2 diabetes mellitus with hyperglycemia: Secondary | ICD-10-CM | POA: Diagnosis not present

## 2021-08-14 DIAGNOSIS — I10 Essential (primary) hypertension: Secondary | ICD-10-CM | POA: Diagnosis not present

## 2021-08-19 NOTE — Assessment & Plan Note (Signed)
Blood pressure is well controlled at this time.  Continue current dose of lisinopril.

## 2021-08-19 NOTE — Assessment & Plan Note (Signed)
Management per endocrinology.  Fairly stable at this time.

## 2021-08-19 NOTE — Progress Notes (Signed)
Larry Rose - 56 y.o. male MRN [id number]  Date of birth: 15-Jun-1966  Subjective No chief complaint on file.   HPI Larry Rose is a 56 year old male here today for follow-up visit.  Reports overall he is doing well.  Continues to see endocrinology for management of diabetes.  He remains on high-dose of insulin and A1c is remained stable at 7.8%.  Blood pressures remain well controlled with current medications.  No issues with tolerance at this time.  Denies chest pain, shortness of breath, palpitations, headaches or vision changes.  He is due for colon cancer screening.  Requests referral for colonoscopy.  ROS:  A comprehensive ROS was completed and negative except as noted per HPI  Allergies  Allergen Reactions   Enalapril Maleate    Lovastatin     Past Medical History:  Diagnosis Date   ALCOHOLISM 11/30/2009   ALLERGIC RHINITIS 04/01/2007   CARPAL TUNNEL SYNDROME, RIGHT 10/06/2008   DIABETES MELLITUS, TYPE II 04/01/2007   Hepatic steatosis    HYPERLIPIDEMIA, ATHEROGENIC 09/29/2007   HYPERTENSION 04/01/2007   Rosacea 05/14/2010   TINEA CRURIS 11/07/2008    Past Surgical History:  Procedure Laterality Date   carpel tunnel     FRACTURE SURGERY      Social History   Socioeconomic History   Marital status: Married    Spouse name: Not on file   Number of children: Not on file   Years of education: Not on file   Highest education level: Not on file  Occupational History   Occupation: Works Quarry manager  Tobacco Use   Smoking status: Never   Smokeless tobacco: Never  Substance and Sexual Activity   Alcohol use: Not Currently    Comment: quit 6 mths ago   Drug use: Not Currently   Sexual activity: Not on file  Other Topics Concern   Not on file  Social History Narrative   Not on file   Social Determinants of Health   Financial Resource Strain: Not on file  Food Insecurity: Not on file  Transportation Needs: Not on file  Physical Activity: Not on file   Stress: Not on file  Social Connections: Not on file    Family History  Problem Relation Age of Onset   Heart attack Maternal Grandfather    Stroke Paternal Grandfather    Cancer Neg Hx     Health Maintenance  Topic Date Due   Hepatitis C Screening  Never done   Zoster Vaccines- Shingrix (1 of 2) Never done   COVID-19 Vaccine (4 - Booster for [employer] series) 08/30/2021 (Originally 10/02/2020)   INFLUENZA VACCINE  11/16/2021 (Originally 03/19/2021)   OPHTHALMOLOGY EXAM  11/16/2021 (Originally 12/19/2020)   Fecal DNA (Cologuard)  09/01/2021   HEMOGLOBIN A1C  01/31/2022   FOOT EXAM  04/13/2022   TETANUS/TDAP  06/15/2029   Pneumococcal Vaccine 34-96 Years old  Completed   HIV Screening  Completed   HPV VACCINES  Aged Out     ----------------------------------------------------------------------------------------------------------------------------------------------------------------------------------------------------------------- Physical Exam BP (!) 129/59    Pulse 79    Ht 5\' 9"  (1.753 m)    Wt (!) 311 lb (141.1 kg)    SpO2 97%    BMI 45.93 kg/m   Physical Exam Constitutional:      Appearance: Normal appearance.  HENT:     Head: Normocephalic and atraumatic.  Eyes:     General: No scleral icterus. Cardiovascular:     Rate and Rhythm: Normal rate and regular rhythm.  Pulmonary:  Effort: Pulmonary effort is normal.     Breath sounds: Normal breath sounds.  Musculoskeletal:     Cervical back: Neck supple.  Neurological:     General: No focal deficit present.     Mental Status: He is alert.  Psychiatric:        Mood and Affect: Mood normal.        Behavior: Behavior normal.    ------------------------------------------------------------------------------------------------------------------------------------------------------------------------------------------------------------------- Assessment and Plan  Essential hypertension Blood pressure is well controlled at  this time.  Continue current dose of lisinopril.  Diabetes Management per endocrinology.  Fairly stable at this time.   No orders of the defined types were placed in this encounter.   No follow-ups on file.    This visit occurred during the SARS-CoV-2 public health emergency.  Safety protocols were in place, including screening questions prior to the visit, additional usage of staff PPE, and extensive cleaning of exam room while observing appropriate contact time as indicated for disinfecting solutions.

## 2021-09-08 ENCOUNTER — Other Ambulatory Visit: Payer: Self-pay | Admitting: Endocrinology

## 2021-09-09 ENCOUNTER — Other Ambulatory Visit: Payer: Self-pay | Admitting: Family Medicine

## 2021-09-09 DIAGNOSIS — I10 Essential (primary) hypertension: Secondary | ICD-10-CM

## 2021-10-31 ENCOUNTER — Other Ambulatory Visit: Payer: Self-pay

## 2021-10-31 ENCOUNTER — Encounter: Payer: Self-pay | Admitting: Endocrinology

## 2021-10-31 ENCOUNTER — Ambulatory Visit: Payer: BC Managed Care – PPO | Admitting: Endocrinology

## 2021-10-31 VITALS — BP 124/70 | HR 64 | Ht 69.0 in | Wt 307.4 lb

## 2021-10-31 DIAGNOSIS — E119 Type 2 diabetes mellitus without complications: Secondary | ICD-10-CM

## 2021-10-31 LAB — POCT GLYCOSYLATED HEMOGLOBIN (HGB A1C): Hemoglobin A1C: 6.5 % — AB (ref 4.0–5.6)

## 2021-10-31 MED ORDER — TRULICITY 4.5 MG/0.5ML ~~LOC~~ SOAJ: 4.5000 mg | SUBCUTANEOUS | 3 refills | Status: DC

## 2021-10-31 NOTE — Progress Notes (Signed)
? ?Subjective:  ? ? Patient ID: Larry Rose, male    DOB: [birth year], 56 y.o.   MRN: [id number] ? ?HPI ?Pt returns for f/u of diabetes mellitus:  ?DM type: Insulin-requiring type 2 ?Dx'ed: 1993 ?Complications: none ?Therapy: insulin since 2009, Trulicity, and Farxiga.  ?DKA: never.  ?Severe hypoglycemia: never.    ?Pancreatitis: never.   ?SDOH: Ins declined Mounjaro; He works 2PM-11PM. ?Other: he takes multiple daily injections; he declines weight-loss surgery;  Ins declined CGM; she has severe insulin resistance.   ?Interval history: No recent steroids.  He takes meds as rx'ed.  He seldom checks CBG.   ?Past Medical History:  ?Diagnosis Date  ? ALCOHOLISM 11/30/2009  ? ALLERGIC RHINITIS 04/01/2007  ? CARPAL TUNNEL SYNDROME, RIGHT 10/06/2008  ? DIABETES MELLITUS, TYPE II 04/01/2007  ? Hepatic steatosis   ? HYPERLIPIDEMIA, ATHEROGENIC 09/29/2007  ? HYPERTENSION 04/01/2007  ? Rosacea 05/14/2010  ? TINEA CRURIS 11/07/2008  ? ? ?Past Surgical History:  ?Procedure Laterality Date  ? carpel tunnel    ? FRACTURE SURGERY    ? ? ?Social History  ? ?Socioeconomic History  ? Marital status: Married  ?  Spouse name: Not on file  ? Number of children: Not on file  ? Years of education: Not on file  ? Highest education level: Not on file  ?Occupational History  ? Occupation: Works Quarry manager  ?Tobacco Use  ? Smoking status: Never  ? Smokeless tobacco: Never  ?Substance and Sexual Activity  ? Alcohol use: Not Currently  ?  Comment: quit 6 mths ago  ? Drug use: Not Currently  ? Sexual activity: Not on file  ?Other Topics Concern  ? Not on file  ?Social History Narrative  ? Not on file  ? ?Social Determinants of Health  ? ?Financial Resource Strain: Not on file  ?Food Insecurity: Not on file  ?Transportation Needs: Not on file  ?Physical Activity: Not on file  ?Stress: Not on file  ?Social Connections: Not on file  ?Intimate Partner Violence: Not on file  ? ? ?Current Outpatient Medications on File Prior to Visit   ?Medication Sig Dispense Refill  ? BD PEN NEEDLE NANO 2ND GEN 32G X 4 MM MISC USE 3 TIMES PER DAY. 100 each 10  ? Dulaglutide (TRULICITY) 4.5 MG/0.5ML SOPN Inject 4.5 mg as directed once a week. 6 mL 3  ? FARXIGA 5 MG TABS tablet TAKE 5 MG BY MOUTH DAILY BEFORE BREAKFAST. 30 tablet 11  ? furosemide (LASIX) 20 MG tablet TAKE 1 TABLET BY MOUTH EVERY DAY 90 tablet 3  ? glucose blood (CONTOUR NEXT TEST) test strip 1 each by Other route in the morning and at bedtime. And lancets 3/day 200 each 3  ? hydrocortisone (ANUSOL-HC) 25 MG suppository Place 1 suppository (25 mg total) rectally 2 (two) times daily. 12 suppository 0  ? insulin regular human CONCENTRATED (HUMULIN R U-500 KWIKPEN) 500 UNIT/ML kwikpen 3 times a day (just before each meal) 150-100-150 units 50 mL 3  ? lisinopril (ZESTRIL) 10 MG tablet TAKE 2 TABLETS BY MOUTH EVERY DAY 30 tablet 0  ? lisinopril (ZESTRIL) 20 MG tablet Take 1 tablet (20 mg total) by mouth daily. 90 tablet 3  ? meloxicam (MOBIC) 15 MG tablet TAKE 1 TABLET EVERY MORNING WITH A MEAL FOR 2 WEEKS,THEN DAILY FOR PAIN 30 tablet 3  ? MITIGARE 0.6 MG CAPS TAKE 2 TABS (1.2MG ) BY MOUTH FOLLOWED 1 TAB 1 HOUR LATER. CONTINUE 1 TAB DAILY UNTIL RESOLUTION. 30 capsule  0  ? Multiple Vitamin (MULTIVITAMIN) tablet Take 1 tablet by mouth daily.    ? ?No current facility-administered medications on file prior to visit.  ? ? ?Allergies  ?Allergen Reactions  ? Enalapril Maleate   ? Lovastatin   ? ? ?Family History  ?Problem Relation Age of Onset  ? Heart attack Maternal Grandfather   ? Stroke Paternal Grandfather   ? Cancer Neg Hx   ? ? ?BP 124/70 (BP Location: Left Arm, Patient Position: Sitting, Cuff Size: Normal)   Pulse 64   Ht 5\' 9"  (1.753 m)   Wt (!) 307 lb 6.4 oz (139.4 kg)   SpO2 99%   BMI 45.40 kg/m?  ? ? ?Review of Systems ?He denies hypoglycemia ?   ?Objective:  ? Physical Exam ?VITAL SIGNS:  See vs page ?GENERAL: no distress ?Pulses: dorsalis pedis intact bilat.   ?MSK: no deformity of the  feet ?CV: trace bilat leg edema ?Skin:  no ulcer on the feet.  normal color and temp on the feet. ?Neuro: sensation is intact to touch on the feet ? ?Lab Results  ?Component Value Date  ? HGBA1C 6.5 (A) 10/31/2021  ? ? ? ?   ?Assessment & Plan:  ?Insulin-requiring type 2 DM: well-controlled.  Please continue the same medications. ? ? ?

## 2021-10-31 NOTE — Patient Instructions (Addendum)
Please continue the same insulin, Trulicity, and [place].   ?check your blood sugar twice a day.  vary the time of day when you check, between before the 3 meals, and at bedtime.  also check if you have symptoms of your blood sugar being too high or too low.  please keep a record of the readings and bring it to your next appointment here (or you can bring the meter itself).  You can write it on any piece of paper.  please call us sooner if your blood sugar goes below 70, or if you have a lot of readings over 200.   ?Please come back for a follow-up appointment in 4 months.   ?

## 2021-12-07 ENCOUNTER — Other Ambulatory Visit: Payer: Self-pay | Admitting: Endocrinology

## 2022-01-21 ENCOUNTER — Other Ambulatory Visit (HOSPITAL_COMMUNITY): Payer: Self-pay

## 2022-01-22 ENCOUNTER — Other Ambulatory Visit (HOSPITAL_COMMUNITY): Payer: Self-pay

## 2022-01-22 MED ORDER — TRULICITY 3 MG/0.5ML ~~LOC~~ SOAJ
3.0000 mg | SUBCUTANEOUS | 3 refills | Status: DC
  Filled 2022-01-22 – 2022-03-20 (×2): qty 6, 84d supply, fill #0

## 2022-01-22 MED ORDER — TRIAMCINOLONE ACETONIDE 0.1 % EX OINT
1.0000 "application " | TOPICAL_OINTMENT | Freq: Two times a day (BID) | CUTANEOUS | 1 refills | Status: DC
  Filled 2022-01-22 – 2022-05-14 (×2): qty 80, 30d supply, fill #0

## 2022-01-22 MED ORDER — TRULICITY 4.5 MG/0.5ML ~~LOC~~ SOAJ
4.5000 mg | SUBCUTANEOUS | 6 refills | Status: DC
  Filled 2022-01-22: qty 2, 28d supply, fill #0

## 2022-01-22 MED ORDER — DAPAGLIFLOZIN PROPANEDIOL 5 MG PO TABS
5.0000 mg | ORAL_TABLET | Freq: Every day | ORAL | 11 refills | Status: DC
Start: 2021-09-12 — End: 2022-09-11
  Filled 2022-01-22: qty 30, 30d supply, fill #0
  Filled 2022-03-12: qty 30, 30d supply, fill #1
  Filled 2022-04-15: qty 30, 30d supply, fill #2
  Filled 2022-05-14: qty 30, 30d supply, fill #3
  Filled 2022-06-10: qty 30, 30d supply, fill #4
  Filled 2022-07-08: qty 30, 30d supply, fill #5
  Filled 2022-08-05: qty 30, 30d supply, fill #6

## 2022-01-22 MED ORDER — UNIFINE PENTIPS 32G X 4 MM MISC
1.0000 | Freq: Three times a day (TID) | 10 refills | Status: DC
Start: 2021-12-07 — End: 2022-12-05
  Filled 2022-01-22: qty 100, 33d supply, fill #0
  Filled 2022-03-20: qty 100, 33d supply, fill #1
  Filled 2022-04-20: qty 100, 33d supply, fill #2
  Filled 2022-06-10: qty 100, 33d supply, fill #3
  Filled 2022-07-08: qty 100, 33d supply, fill #4
  Filled 2022-08-10: qty 100, 33d supply, fill #5
  Filled 2022-09-12: qty 100, 33d supply, fill #6
  Filled 2022-11-02: qty 100, 33d supply, fill #7

## 2022-01-22 MED ORDER — FUROSEMIDE 20 MG PO TABS
20.0000 mg | ORAL_TABLET | Freq: Every day | ORAL | 3 refills | Status: DC
  Filled 2022-01-22: qty 90, 90d supply, fill #0
  Filled 2022-04-20: qty 90, 90d supply, fill #1

## 2022-01-22 MED ORDER — TRULICITY 3 MG/0.5ML ~~LOC~~ SOAJ
3.0000 mg | SUBCUTANEOUS | 6 refills | Status: DC
Start: 2021-06-01 — End: 2022-01-22
  Filled 2022-01-22: qty 2, 28d supply, fill #0

## 2022-01-24 ENCOUNTER — Other Ambulatory Visit (HOSPITAL_COMMUNITY): Payer: Self-pay

## 2022-01-31 ENCOUNTER — Telehealth: Payer: Self-pay | Admitting: Pharmacy Technician

## 2022-01-31 NOTE — Telephone Encounter (Signed)
Received notification from [facility] regarding a prior authorization for  Trulicity . Authorization has been APPROVED from 01/29/22 to 01/29/23.   Authorization # [id number] Phone # [phone]

## 2022-03-10 ENCOUNTER — Other Ambulatory Visit: Payer: Self-pay | Admitting: Endocrinology

## 2022-03-10 DIAGNOSIS — E119 Type 2 diabetes mellitus without complications: Secondary | ICD-10-CM

## 2022-03-12 ENCOUNTER — Other Ambulatory Visit (HOSPITAL_COMMUNITY): Payer: Self-pay

## 2022-03-12 ENCOUNTER — Other Ambulatory Visit: Payer: Self-pay | Admitting: Family Medicine

## 2022-03-12 MED ORDER — LISINOPRIL 20 MG PO TABS
20.0000 mg | ORAL_TABLET | Freq: Every day | ORAL | 0 refills | Status: DC
  Filled 2022-03-12 – 2022-03-20 (×2): qty 90, 90d supply, fill #0

## 2022-03-20 ENCOUNTER — Other Ambulatory Visit (HOSPITAL_COMMUNITY): Payer: Self-pay

## 2022-04-04 ENCOUNTER — Other Ambulatory Visit: Payer: Self-pay | Admitting: Sports Medicine

## 2022-04-04 ENCOUNTER — Other Ambulatory Visit (HOSPITAL_COMMUNITY): Payer: Self-pay

## 2022-04-04 DIAGNOSIS — R29898 Other symptoms and signs involving the musculoskeletal system: Secondary | ICD-10-CM

## 2022-04-11 ENCOUNTER — Ambulatory Visit: Payer: 59 | Admitting: Orthopedic Surgery

## 2022-04-11 ENCOUNTER — Encounter: Payer: Self-pay | Admitting: Orthopedic Surgery

## 2022-04-11 VITALS — BP 144/82 | HR 71 | Temp 97.3°F | Resp 20 | Ht 69.0 in | Wt 308.6 lb

## 2022-04-11 DIAGNOSIS — E1165 Type 2 diabetes mellitus with hyperglycemia: Secondary | ICD-10-CM

## 2022-04-11 DIAGNOSIS — Z6841 Body Mass Index (BMI) 40.0 and over, adult: Secondary | ICD-10-CM

## 2022-04-11 DIAGNOSIS — Z794 Long term (current) use of insulin: Secondary | ICD-10-CM

## 2022-04-11 DIAGNOSIS — M5412 Radiculopathy, cervical region: Secondary | ICD-10-CM

## 2022-04-11 DIAGNOSIS — I10 Essential (primary) hypertension: Secondary | ICD-10-CM | POA: Diagnosis not present

## 2022-04-11 DIAGNOSIS — F1010 Alcohol abuse, uncomplicated: Secondary | ICD-10-CM

## 2022-04-11 NOTE — Progress Notes (Signed)
Careteam: Patient Care Team: Everrett Coombe, DO as PCP - General (Family Medicine)  Seen by: Hazle Nordmann, AGNP-C  PLACE OF SERVICE:  [facility]  Advanced Directive information    Allergies  Allergen Reactions   Enalapril Maleate    Lovastatin     Chief Complaint  Patient presents with   New Patient (Initial Visit)    Patient presents today for a new patient appointment.     HPI: Patient is a 56 y.o. male seen today to establish at [facility].   Previous PCP Dr. Everrett Coombe. Medical record reviewed. Wanted closer provider. Originally from [place]. 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 No children. Lives in single story home. 2 dogs.   T2DM- followed by Dr. Everardo All, diagnosed with diabetes in his 20's, maternal grandmother had diabetes, does not check sugars often, remains on insulin/Trulicity/Farxiga, stopped taking asa due to past gout episode, not on cholesterol medication, diabetic eye exam 2022- followed by Dr.Cooper  Obesity- Mounjaro declined by insurance 05/2021, he has lost 30 lbs since starting Trulicity, see diet below  HTN- remains on lisinopril and furosemide  Neck pain- on meloxicam prn, injections by Dr. [name]  No recent hospitalizations or injuries.   Past surgeries:  Right hand surgery- 06/16/2019  Carpal tunnel to both hands- 2012  Mother aged 60- memory issues, mental health issues, hyponatremia Father aged 52- HTN, hypothyroidism, RA, PAD, CHF Brother passed at 9- brain aneurysm   Procedures:  Colonoscopy- completed cologuard 08/2021  Smoking: never smoked Alcohol: drinks 6 beers daily, no past alcohol abuse Illicit drugs: past use of marijuana as teenager, does not use drugs now Sex: monogamous relationship with wife  Diet: eats 3 meals daily and one snack, eats carb in every meal, has fast food/restaurant food about 4x/week, drinks about 4-5 diet soft drinks daily Exercise: does not follow scheduled  exercise plan  Dentist: last cleaning about 6 months   Does not have living will  Review of Systems:  Review of Systems  Constitutional:  Negative for chills, fever and malaise/fatigue.  HENT:  Positive for congestion. Negative for sore throat.   Respiratory:  Negative for cough, shortness of breath and wheezing.   Cardiovascular:  Negative for chest pain and leg swelling.  Gastrointestinal:  Negative for abdominal pain, blood in stool, constipation, diarrhea, heartburn, nausea and vomiting.  Genitourinary:  Negative for dysuria.  Musculoskeletal:  Positive for joint pain. Negative for falls.  Skin:  Negative for rash.  Neurological:  Negative for dizziness, sensory change, weakness and headaches.  Psychiatric/Behavioral:  Negative for depression. The patient is not nervous/anxious and does not have insomnia.     Past Medical History:  Diagnosis Date   ALCOHOLISM 11/30/2009   ALLERGIC RHINITIS 04/01/2007   CARPAL TUNNEL SYNDROME, RIGHT 10/06/2008   DIABETES MELLITUS, TYPE II 04/01/2007   Hepatic steatosis    HYPERLIPIDEMIA, ATHEROGENIC 09/29/2007   HYPERTENSION 04/01/2007   Rosacea 05/14/2010   TINEA CRURIS 11/07/2008   Past Surgical History:  Procedure Laterality Date   carpel tunnel     FRACTURE SURGERY     Social History:   reports that he has never smoked. He has never used smokeless tobacco. He reports that he does not currently use alcohol. He reports that he does not currently use drugs.  Family History  Problem Relation Age of Onset   Heart attack Maternal Grandfather    Stroke Paternal Grandfather    Cancer Neg Hx     Medications: Patient's  Medications  New Prescriptions   No medications on file  Previous Medications   BD PEN NEEDLE NANO 2ND GEN 32G X 4 MM MISC    USE 3 TIMES PER DAY.   DAPAGLIFLOZIN PROPANEDIOL (FARXIGA) 5 MG TABS TABLET    Take 1 tablet (5 mg total) by mouth daily before breakfast.   DULAGLUTIDE (TRULICITY) 3 MG/0.5ML SOPN    Inject 3 mg as  directed once a week.   DULAGLUTIDE (TRULICITY) 4.5 MG/0.5ML SOPN    Inject 4.5 mg as directed once a week.   FARXIGA 5 MG TABS TABLET    TAKE 5 MG BY MOUTH DAILY BEFORE BREAKFAST.   FUROSEMIDE (LASIX) 20 MG TABLET    TAKE 1 TABLET BY MOUTH EVERY DAY   FUROSEMIDE (LASIX) 20 MG TABLET    Take 1 tablet (20 mg total) by mouth daily.   GLUCOSE BLOOD (CONTOUR NEXT TEST) TEST STRIP    1 each by Other route in the morning and at bedtime. And lancets 3/day   HYDROCORTISONE (ANUSOL-HC) 25 MG SUPPOSITORY    Place 1 suppository (25 mg total) rectally 2 (two) times daily.   INSULIN PEN NEEDLE (UNIFINE PENTIPS) 32G X 4 MM MISC    use 3 times per day   INSULIN REGULAR HUMAN CONCENTRATED (HUMULIN R U-500 KWIKPEN) 500 UNIT/ML KWIKPEN    3 times a day (just before each meal) 150-100-150 units   LISINOPRIL (ZESTRIL) 20 MG TABLET    Take 1 tablet (20 mg total) by mouth daily.   MELOXICAM (MOBIC) 15 MG TABLET    TAKE 1 TABLET EVERY MORNING WITH A MEAL FOR 2 WEEKS,THEN DAILY FOR PAIN   MITIGARE 0.6 MG CAPS    TAKE 2 TABS (1.2MG ) BY MOUTH FOLLOWED 1 TAB 1 HOUR LATER. CONTINUE 1 TAB DAILY UNTIL RESOLUTION.   MULTIPLE VITAMIN (MULTIVITAMIN) TABLET    Take 1 tablet by mouth daily.   TRIAMCINOLONE OINTMENT (KENALOG) 0.1 %    Apply 1 application topically to itchy scaly patches 2 (two) times daily.  Modified Medications   No medications on file  Discontinued Medications   No medications on file    Physical Exam:  There were no vitals filed for this visit. There is no height or weight on file to calculate BMI. Wt Readings from Last 3 Encounters:  10/31/21 (!) 307 lb 6.4 oz (139.4 kg)  08/14/21 (!) 311 lb (141.1 kg)  08/02/21 (!) 311 lb 6.4 oz (141.3 kg)    Physical Exam Vitals reviewed.  Constitutional:      General: He is not in acute distress.    Appearance: He is obese.  HENT:     Head: Normocephalic.  Eyes:     General:        Right eye: No discharge.        Left eye: No discharge.  Cardiovascular:      Rate and Rhythm: Normal rate and regular rhythm.     Pulses: Normal pulses.     Heart sounds: Normal heart sounds.  Pulmonary:     Effort: Pulmonary effort is normal. No respiratory distress.     Breath sounds: Normal breath sounds. No wheezing.  Abdominal:     General: Bowel sounds are normal. There is no distension.     Palpations: Abdomen is soft.     Tenderness: There is no abdominal tenderness.  Musculoskeletal:     Cervical back: Neck supple.     Right lower leg: No edema.     Left  lower leg: No edema.  Skin:    General: Skin is warm and dry.     Capillary Refill: Capillary refill takes less than 2 seconds.     Comments: Rosacea to face  Neurological:     General: No focal deficit present.     Mental Status: He is alert and oriented to person, place, and time.     Motor: No weakness.     Gait: Gait normal.  Psychiatric:        Mood and Affect: Mood normal.        Behavior: Behavior normal.     Labs reviewed: Basic Metabolic Panel: No results for input(s): "NA", "K", "CL", "CO2", "GLUCOSE", "BUN", "CREATININE", "CALCIUM", "MG", "PHOS", "TSH" in the last 8760 hours. Liver Function Tests: No results for input(s): "AST", "ALT", "ALKPHOS", "BILITOT", "PROT", "ALBUMIN" in the last 8760 hours. No results for input(s): "LIPASE", "AMYLASE" in the last 8760 hours. No results for input(s): "AMMONIA" in the last 8760 hours. CBC: No results for input(s): "WBC", "NEUTROABS", "HGB", "HCT", "MCV", "PLT" in the last 8760 hours. Lipid Panel: No results for input(s): "CHOL", "HDL", "LDLCALC", "TRIG", "CHOLHDL", "LDLDIRECT" in the last 8760 hours. TSH: No results for input(s): "TSH" in the last 8760 hours. A1C: Lab Results  Component Value Date   HGBA1C 6.5 (A) 10/31/2021     Assessment/Plan 1. Type 2 diabetes mellitus with hyperglycemia, with long-term current use of insulin (HCC) - followed by endocrinology - A1C 6.5 10/31/2021 - cont SSI, Trulicity, Farixga - off asa  due to gout flare - not on statin - advised to schedule diabetic eye exam - discussed carb modified diet  2. Morbid obesity with BMI of 45.0-49.9, adult (HCC) - BMI 45/57 - advised to track calories x 1 week - consume < 2000 calories/day  - exercise 150 minutes/week - advised to stop drinking soda  3. Essential hypertension - controlled - cont lisinopril and furosemide - EKG- future  4. Radiculitis of right cervical region - followed by Dr. [name] - cont meloxicam prn- advised to take with food  5. Alcohol use disorder, mild, abuse - drinking 6 beers daily - AST/ALT 30/36 01/2021 - cmp- future  Future labs/tests: EKG  Total time: 48 minutes. Greater than 50% of total time spent doing patient education regarding health maintenance, diabetes, weight loss, and symptom/medication management.    Next appt: 05/16/2022  Hazle Nordmann, Juel Burrow  [facility] [phone]

## 2022-04-11 NOTE — Patient Instructions (Addendum)
Count calories with "My fitness pal"  2,000 calories/day maintain weight  < 1800 calories/day to lose weight  Recommend exercise 150 minutes/week  Eliminate diet soft drinks  Tylenol 1000 mg daily as needed for pain  Voltaren gel - similar to Advil but topical- apply to area of pain 3-4x/daily

## 2022-04-15 ENCOUNTER — Other Ambulatory Visit (HOSPITAL_COMMUNITY): Payer: Self-pay

## 2022-04-23 ENCOUNTER — Other Ambulatory Visit (HOSPITAL_COMMUNITY): Payer: Self-pay

## 2022-05-14 ENCOUNTER — Other Ambulatory Visit (HOSPITAL_COMMUNITY): Payer: Self-pay

## 2022-05-16 ENCOUNTER — Ambulatory Visit: Payer: 59 | Admitting: Orthopedic Surgery

## 2022-05-21 ENCOUNTER — Other Ambulatory Visit: Payer: BC Managed Care – PPO

## 2022-05-22 ENCOUNTER — Other Ambulatory Visit (INDEPENDENT_AMBULATORY_CARE_PROVIDER_SITE_OTHER): Payer: Self-pay

## 2022-05-22 DIAGNOSIS — E119 Type 2 diabetes mellitus without complications: Secondary | ICD-10-CM

## 2022-05-23 LAB — COMPREHENSIVE METABOLIC PANEL
ALT: 32 U/L (ref 0–53)
AST: 31 U/L (ref 0–37)
Albumin: 4.3 g/dL (ref 3.5–5.2)
Alkaline Phosphatase: 55 U/L (ref 39–117)
BUN: 20 mg/dL (ref 6–23)
CO2: 31 mEq/L (ref 19–32)
Calcium: 9.9 mg/dL (ref 8.4–10.5)
Chloride: 99 mEq/L (ref 96–112)
Creatinine, Ser: 0.99 mg/dL (ref 0.40–1.50)
GFR: 86.8 mL/min (ref >60.00)
Glucose, Bld: 146 mg/dL — ABNORMAL HIGH (ref 70–99)
Potassium: 4.4 mEq/L (ref 3.5–5.1)
Sodium: 138 mEq/L (ref 135–145)
Total Bilirubin: 0.6 mg/dL (ref 0.2–1.2)
Total Protein: 7.4 g/dL (ref 6.0–8.3)

## 2022-05-23 LAB — HEMOGLOBIN A1C: Hgb A1c MFr Bld: 7.4 % — ABNORMAL HIGH (ref 4.6–6.5)

## 2022-05-23 LAB — MICROALBUMIN / CREATININE URINE RATIO
Creatinine,U: 75.1 mg/dL
Microalb Creat Ratio: 0.9 mg/g (ref 0.0–30.0)
Microalb, Ur: <0.7 mg/dL (ref 0.0–1.9)

## 2022-05-23 LAB — LIPID PANEL
Cholesterol: 244 mg/dL — ABNORMAL HIGH (ref 0–200)
HDL: 42.2 mg/dL (ref >39.00)
Total CHOL/HDL Ratio: 6
Triglycerides: 401 mg/dL — ABNORMAL HIGH (ref 0.0–149.0)

## 2022-05-23 LAB — LDL CHOLESTEROL, DIRECT: Direct LDL: 123 mg/dL

## 2022-05-24 ENCOUNTER — Other Ambulatory Visit: Payer: BC Managed Care – PPO

## 2022-05-27 ENCOUNTER — Encounter: Payer: Self-pay | Admitting: Endocrinology

## 2022-05-27 ENCOUNTER — Other Ambulatory Visit (HOSPITAL_COMMUNITY): Payer: Self-pay

## 2022-05-27 ENCOUNTER — Ambulatory Visit: Payer: 59 | Admitting: Endocrinology

## 2022-05-27 VITALS — BP 112/60 | HR 79 | Ht 69.0 in | Wt 310.2 lb

## 2022-05-27 DIAGNOSIS — E1165 Type 2 diabetes mellitus with hyperglycemia: Secondary | ICD-10-CM

## 2022-05-27 DIAGNOSIS — I10 Essential (primary) hypertension: Secondary | ICD-10-CM | POA: Diagnosis not present

## 2022-05-27 DIAGNOSIS — Z794 Long term (current) use of insulin: Secondary | ICD-10-CM

## 2022-05-27 DIAGNOSIS — E782 Mixed hyperlipidemia: Secondary | ICD-10-CM | POA: Diagnosis not present

## 2022-05-27 MED ORDER — TIRZEPATIDE 5 MG/0.5ML ~~LOC~~ SOAJ
5.0000 mg | SUBCUTANEOUS | 0 refills | Status: DC
  Filled 2022-05-27: qty 2, 28d supply, fill #0

## 2022-05-27 NOTE — Progress Notes (Unsigned)
Patient ID: Larry Rose, male   DOB: 04-17-1966, 56 y.o.   MRN: [id number]            Reason for Appointment: Type II Diabetes follow-up   History of Present Illness   Diagnosis date: 1993  Previous history:  Non-insulin hypoglycemic drugs previously used:?  Metformin, Farxiga, Trulicity Insulin was started in 2009  A1c range in the last few years is: 6.6-9%  Recent history:     Non-insulin hypoglycemic drugs: Farxiga 5 mg daily, Trulicity 3 mg weekly     Insulin regimen: Humulin R before meals 150 - 130 - 140 units       Side effects from medications: diarrhea from Trulicity  Current self management, blood sugar patterns and problems identified:  A1c is 7.4 He says that he is having higher blood sugars lately because of stress with family illness and eating poorly Also because of lack of motivation he is checking his blood sugars very infrequently and does not know what they are He thinks he may have been prescribed CGM sensor last year but because of lack of insurance coverage he did not start this However he is reluctant to consider this as an option He takes his insulin consistently but is only taking it either right before or 10 minutes before eating Even though he has been on [place] for more than a year he has only been taking 5 mg Currently he says that he tends to get diarrhea periodically with taking Trulicity Lab glucose 146 mid afternoon  Exercise: No formal exercise, is walking throughout the day at work  Diet management: Not planning meals well and eating quick meals     Hypoglycemia:  2 weeks ago before lunch  Glucometer: Freestyle lite   Dietician visit: Most recent: Years ago     Weight control:  Wt Readings from Last 3 Encounters:  05/27/22 (!) 310 lb 3.2 oz (140.7 kg)  04/11/22 (!) 308 lb 9.6 oz (140 kg)  10/31/21 (!) 307 lb 6.4 oz (139.4 kg)            Diabetes labs:  Lab Results  Component Value Date   HGBA1C 7.4 (H) 05/22/2022    HGBA1C 6.5 (A) 10/31/2021   HGBA1C 7.8 (A) 08/02/2021   Lab Results  Component Value Date   MICROALBUR <0.7 05/22/2022   LDLCALC  02/09/2021     Comment:     . LDL cholesterol not calculated. Triglyceride levels greater than 400 mg/dL invalidate calculated LDL results. . Reference range: <100 . Desirable range <100 mg/dL for primary prevention;   <70 mg/dL for patients with CHD or diabetic patients  with > or = 2 CHD risk factors. Marland Kitchen LDL-C is now calculated using the Martin-Hopkins  calculation, which is a validated novel method providing  better accuracy than the Friedewald equation in the  estimation of LDL-C.  Horald Pollen et al. Lenox Ahr. 6387;564(33): 2061-2068  (http://education.[facility].com/faq/FAQ164)    CREATININE 0.99 05/22/2022    No results found for: "FRUCTOSAMINE"   Allergies as of 05/27/2022       Reactions   Enalapril Maleate    Lovastatin         Medication List        Accurate as of May 27, 2022  4:29 PM. If you have any questions, ask your nurse or doctor.          STOP taking these medications    Trulicity 3 MG/0.5ML Sopn Generic drug: Dulaglutide Stopped by: Reather Littler, MD  TAKE these medications    BD Pen Needle Nano 2nd Gen 32G X 4 MM Misc Generic drug: Insulin Pen Needle USE 3 TIMES PER DAY.   TechLite Pen Needles 32G X 4 MM Misc Generic drug: Insulin Pen Needle use 3 times per day   Contour Next Test test strip Generic drug: glucose blood 1 each by Other route in the morning and at bedtime. And lancets 3/day   Farxiga 5 MG Tabs tablet Generic drug: dapagliflozin propanediol Take 1 tablet (5 mg total) by mouth daily before breakfast.   furosemide 20 MG tablet Commonly known as: LASIX Take 1 tablet (20 mg total) by mouth daily.   HumuLIN R U-500 KwikPen 500 UNIT/ML KwikPen Generic drug: insulin regular human CONCENTRATED 3 times a day (just before each meal) 150-100-150 units What changed: additional  instructions   hydrocortisone 25 MG suppository Commonly known as: ANUSOL-HC Place 1 suppository (25 mg total) rectally 2 (two) times daily.   lisinopril 20 MG tablet Commonly known as: ZESTRIL Take 1 tablet (20 mg total) by mouth daily.   meloxicam 15 MG tablet Commonly known as: MOBIC TAKE 1 TABLET EVERY MORNING WITH A MEAL FOR 2 WEEKS,THEN DAILY FOR PAIN   Mitigare 0.6 MG Caps Generic drug: Colchicine TAKE 2 TABS (1.2MG ) BY MOUTH FOLLOWED 1 TAB 1 HOUR LATER. CONTINUE 1 TAB DAILY UNTIL RESOLUTION.   multivitamin tablet Take 1 tablet by mouth daily.   tirzepatide 5 MG/0.5ML Pen Commonly known as: MOUNJARO Inject 5 mg into the skin once a week. Started by: Elayne Snare, MD   triamcinolone ointment 0.1 % Commonly known as: KENALOG Apply 1 application topically to itchy scaly patches 2 (two) times daily.        Allergies:  Allergies  Allergen Reactions   Enalapril Maleate    Lovastatin     Past Medical History:  Diagnosis Date   ALCOHOLISM 11/30/2009   ALLERGIC RHINITIS 04/01/2007   CARPAL TUNNEL SYNDROME, RIGHT 10/06/2008   DIABETES MELLITUS, TYPE II 04/01/2007   Hepatic steatosis    HYPERLIPIDEMIA, ATHEROGENIC 09/29/2007   HYPERTENSION 04/01/2007   Rosacea 05/14/2010   TINEA CRURIS 11/07/2008    Past Surgical History:  Procedure Laterality Date   carpel tunnel     FRACTURE SURGERY      Family History  Problem Relation Age of Onset   Hypertension Father    Pneumonia Father    Diabetes Maternal Grandmother    Heart attack Maternal Grandfather    Stroke Paternal Grandfather    Cancer Neg Hx     Social History:  reports that he has never smoked. He has never used smokeless tobacco. He reports current alcohol use. He reports that he does not currently use drugs.  Review of Systems:  Last diabetic eye exam date  Last urine microalbumin date:  Last foot exam date:  Symptoms of neuropathy:none  Hypertension:   ACE/ARB medication:  BP Readings from  Last 3 Encounters:  05/27/22 112/60  04/11/22 (!) 144/82  10/31/21 124/70    Lipid management: none  Beer 2 shots    Lab Results  Component Value Date   CHOL 244 (H) 05/22/2022   CHOL 237 (H) 02/09/2021   CHOL 245 (H) 06/02/2018   Lab Results  Component Value Date   HDL 42.20 05/22/2022   HDL 40 02/09/2021   HDL 27.70 (L) 06/02/2018   Lab Results  Component Value Date   [facility]  02/09/2021     Comment:     . LDL  cholesterol not calculated. Triglyceride levels greater than 400 mg/dL invalidate calculated LDL results. . Reference range: <100 . Desirable range <100 mg/dL for primary prevention;   <70 mg/dL for patients with CHD or diabetic patients  with > or = 2 CHD risk factors. Marland Kitchen LDL-C is now calculated using the Martin-Hopkins  calculation, which is a validated novel method providing  better accuracy than the Friedewald equation in the  estimation of LDL-C.  Horald Pollen et al. Lenox Ahr. 4270;623(76): 2061-2068  (http://education.[facility].com/faq/FAQ164)    LDLCALC NOT CALC 08/23/2014   Lab Results  Component Value Date   TRIG (H) 05/22/2022    401.0 Triglyceride is over 400; calculations on Lipids are invalid.   TRIG 514 (H) 02/09/2021   TRIG (H) 06/02/2018    725.0 Triglyceride is over 400; calculations on Lipids are invalid.   Lab Results  Component Value Date   CHOLHDL 6 05/22/2022   CHOLHDL 5.9 (H) 02/09/2021   CHOLHDL 9 06/02/2018   Lab Results  Component Value Date   LDLDIRECT 123.0 05/22/2022   LDLDIRECT 112 (H) 02/09/2021   LDLDIRECT 80.0 06/02/2018    Unable to calculate Fibrosis 4 Score. Requires ALT, AST, and platelet count within the last 6 months.      Examination:   BP 112/60   Pulse 79   Ht 5\' 9"  (1.753 m)   Wt (!) 310 lb 3.2 oz (140.7 kg)   SpO2 97%   BMI 45.81 kg/m   Body mass index is 45.81 kg/m.    ASSESSMENT/ PLAN:    Diabetes type 2 on insulin:   Current regimen: Trulicity, Farxiga 5 mg and Humulin R U-500  insulin  See history of present illness for detailed discussion of current diabetes management, blood sugar patterns and problems identified  A1c is 7.4 but usually better than this  Unable to judge his blood sugar patterns because of not checking his readings at home Also not getting adequate management of his meal planning or exercise He is not aware of the need for taking his Humulin R 30 minutes at least before eating  Recommendations:  He thinks he can start doing better with his diet and is planning once his family situation is improved Also needs to consistently check his blood sugars at different times by rotation and discussed blood sugar targets both fasting and after meals He is reluctant to use the freestyle libre or Dexcom sensor despite discussing the benefits of these but I was able to convince him to try a sample to be shown to him by the diabetes educator He needs to change his insulin dose to 30 minutes before eating consistently at least at breakfast and dinner Increase Farxiga to 10 mg Trial of Mounjaro instead of Trulicity to see if he tolerates it better and also potentially may have better effect on glucose control and weight, he will switch to 5 mg weekly Discussed with the patient the action of GIP/GLP-1 drugs, the effects on pancreatic and liver function, effects on brain and stomach with improved satiety, slowing gastric emptying, improving satiety and reducing liver glucose output.  Discussed the effects on promoting weight loss. Explained possible side effects of MOUNJARO, most commonly nausea that usually improves over time; discussed safety information in package insert.  Discussed that the injection device is similar; also after 4 weeks he can potentially go up to 10 mg Patient brochure on Mounjaro and explained use of the available co-pay card  No change in insulin dose yet but will adjust  based on his follow-up blood sugars, to call if he has any  hypoglycemia  HYPERLIPIDEMIA: This is poorly controlled with consistently high LDL and triglycerides although his labs were nonfasting  He can start with 5 mg of Crestor to get his LDL below 100 and discussed rationale for doing this Also check fasting lipids on the next visit and consider adding at least fenofibrate if triglycerides still high He will need to cut back on alcohol intake, carbohydrates and high-fat meals Weight loss is important   Patient Instructions  Take insulin 30 min before meals  Check blood sugars on waking up days    Also check blood sugars about 2 hours after meals and do this after different meals by rotation  Recommended blood sugar levels on waking up are 90-130 and about 2 hours after meal is 130-180  Please bring your blood sugar monitor to each visit, thank you  Farxiga 2 pills     Total visit time for evaluation and management of multiple problems, review of all relevant records, counseling on the above subjects = 40 minutes  Reather Littler 05/27/2022, 4:29 PM

## 2022-05-27 NOTE — Patient Instructions (Addendum)
Take insulin 30 min before meals  Check blood sugars on waking up days    Also check blood sugars about 2 hours after meals and do this after different meals by rotation  Recommended blood sugar levels on waking up are 90-130 and about 2 hours after meal is 130-180  Please bring your blood sugar monitor to each visit, thank you  Farxiga 2 pills   Lasix as needed only

## 2022-05-28 ENCOUNTER — Other Ambulatory Visit (HOSPITAL_COMMUNITY): Payer: Self-pay

## 2022-05-28 MED ORDER — ROSUVASTATIN CALCIUM 5 MG PO TABS
5.0000 mg | ORAL_TABLET | Freq: Every day | ORAL | 3 refills | Status: DC
  Filled 2022-05-28 – 2022-06-05 (×2): qty 30, 30d supply, fill #0
  Filled 2022-07-08: qty 30, 30d supply, fill #1
  Filled 2022-08-05: qty 30, 30d supply, fill #2
  Filled 2022-09-04: qty 30, 30d supply, fill #3

## 2022-05-29 ENCOUNTER — Other Ambulatory Visit (HOSPITAL_COMMUNITY): Payer: Self-pay

## 2022-06-01 ENCOUNTER — Encounter: Payer: Self-pay | Admitting: Endocrinology

## 2022-06-05 ENCOUNTER — Other Ambulatory Visit (HOSPITAL_COMMUNITY): Payer: Self-pay

## 2022-06-10 ENCOUNTER — Other Ambulatory Visit (HOSPITAL_COMMUNITY): Payer: Self-pay

## 2022-06-10 ENCOUNTER — Other Ambulatory Visit: Payer: Self-pay | Admitting: Family Medicine

## 2022-06-11 ENCOUNTER — Other Ambulatory Visit (HOSPITAL_COMMUNITY): Payer: Self-pay

## 2022-06-16 ENCOUNTER — Other Ambulatory Visit: Payer: Self-pay | Admitting: Family Medicine

## 2022-06-16 ENCOUNTER — Other Ambulatory Visit: Payer: Self-pay | Admitting: Endocrinology

## 2022-06-16 DIAGNOSIS — I10 Essential (primary) hypertension: Secondary | ICD-10-CM

## 2022-06-17 ENCOUNTER — Other Ambulatory Visit (HOSPITAL_COMMUNITY): Payer: Self-pay

## 2022-06-17 MED ORDER — LISINOPRIL 20 MG PO TABS
20.0000 mg | ORAL_TABLET | Freq: Every day | ORAL | 0 refills | Status: DC
  Filled 2022-06-17: qty 30, 30d supply, fill #0

## 2022-06-17 NOTE — Telephone Encounter (Signed)
Pls contact patient to schedule HTN appt. Last seen 07/2021. Sending 30 day medication refill. Thanks

## 2022-06-17 NOTE — Telephone Encounter (Signed)
I spoke with pt and he confirmed that he had switched providers.

## 2022-06-19 ENCOUNTER — Other Ambulatory Visit (HOSPITAL_COMMUNITY): Payer: Self-pay

## 2022-06-19 MED ORDER — MOUNJARO 5 MG/0.5ML ~~LOC~~ SOAJ
5.0000 mg | SUBCUTANEOUS | 0 refills | Status: DC
  Filled 2022-06-19: qty 2, 28d supply, fill #0

## 2022-06-28 ENCOUNTER — Encounter: Payer: Self-pay | Admitting: Endocrinology

## 2022-07-08 ENCOUNTER — Other Ambulatory Visit (HOSPITAL_COMMUNITY): Payer: Self-pay

## 2022-07-12 ENCOUNTER — Other Ambulatory Visit (HOSPITAL_COMMUNITY): Payer: Self-pay

## 2022-07-14 ENCOUNTER — Other Ambulatory Visit: Payer: Self-pay | Admitting: Family Medicine

## 2022-07-14 DIAGNOSIS — I10 Essential (primary) hypertension: Secondary | ICD-10-CM

## 2022-07-15 ENCOUNTER — Other Ambulatory Visit (HOSPITAL_COMMUNITY): Payer: Self-pay

## 2022-07-15 ENCOUNTER — Other Ambulatory Visit: Payer: Self-pay | Admitting: Family Medicine

## 2022-07-15 DIAGNOSIS — I10 Essential (primary) hypertension: Secondary | ICD-10-CM

## 2022-07-17 ENCOUNTER — Other Ambulatory Visit (HOSPITAL_COMMUNITY): Payer: Self-pay

## 2022-07-17 ENCOUNTER — Telehealth: Payer: Self-pay | Admitting: Family Medicine

## 2022-07-17 DIAGNOSIS — I10 Essential (primary) hypertension: Secondary | ICD-10-CM

## 2022-07-17 MED ORDER — LISINOPRIL 20 MG PO TABS
20.0000 mg | ORAL_TABLET | Freq: Every day | ORAL | 0 refills | Status: DC
  Filled 2022-07-17: qty 30, 30d supply, fill #0

## 2022-07-17 MED ORDER — FUROSEMIDE 20 MG PO TABS
20.0000 mg | ORAL_TABLET | Freq: Every day | ORAL | 0 refills | Status: DC
  Filled 2022-07-17: qty 30, 30d supply, fill #0

## 2022-07-17 NOTE — Telephone Encounter (Signed)
He should contact new PCP for refills.

## 2022-07-17 NOTE — Telephone Encounter (Signed)
Spoke with patient, patient states that he has changed PCP to Dr.Fargo at [facility]!

## 2022-07-17 NOTE — Telephone Encounter (Signed)
Please contact patient to schedule appt with Dr. Ashley Royalty. Last seen 08/14/2021. Sending 30 day refills until appt. Thanks

## 2022-07-18 ENCOUNTER — Other Ambulatory Visit (HOSPITAL_COMMUNITY): Payer: Self-pay

## 2022-07-23 ENCOUNTER — Ambulatory Visit: Payer: 59 | Admitting: Family Medicine

## 2022-08-01 ENCOUNTER — Other Ambulatory Visit (HOSPITAL_COMMUNITY): Payer: Self-pay

## 2022-08-01 ENCOUNTER — Other Ambulatory Visit: Payer: Self-pay | Admitting: Endocrinology

## 2022-08-01 DIAGNOSIS — E1165 Type 2 diabetes mellitus with hyperglycemia: Secondary | ICD-10-CM

## 2022-08-01 MED ORDER — HUMULIN R U-500 KWIKPEN 500 UNIT/ML ~~LOC~~ SOPN
PEN_INJECTOR | SUBCUTANEOUS | 3 refills | Status: DC
  Filled 2022-08-01: qty 24, 28d supply, fill #0
  Filled 2022-09-04: qty 24, 28d supply, fill #1
  Filled 2022-10-04: qty 24, 28d supply, fill #2
  Filled 2022-11-02: qty 24, 28d supply, fill #3
  Filled 2022-11-30: qty 24, 28d supply, fill #4
  Filled 2022-12-28: qty 24, 28d supply, fill #5
  Filled 2023-01-27: qty 24, 28d supply, fill #6
  Filled 2023-03-04: qty 24, 28d supply, fill #7
  Filled 2023-03-31: qty 6, 7d supply, fill #8

## 2022-08-01 MED ORDER — MOUNJARO 5 MG/0.5ML ~~LOC~~ SOAJ
5.0000 mg | SUBCUTANEOUS | 0 refills | Status: DC
  Filled 2022-08-01: qty 2, 28d supply, fill #0

## 2022-08-02 ENCOUNTER — Other Ambulatory Visit (HOSPITAL_COMMUNITY): Payer: Self-pay

## 2022-08-05 ENCOUNTER — Other Ambulatory Visit: Payer: Self-pay

## 2022-08-05 ENCOUNTER — Other Ambulatory Visit: Payer: Self-pay | Admitting: Family Medicine

## 2022-08-05 DIAGNOSIS — I10 Essential (primary) hypertension: Secondary | ICD-10-CM

## 2022-08-13 ENCOUNTER — Other Ambulatory Visit (HOSPITAL_COMMUNITY): Payer: Self-pay

## 2022-08-15 ENCOUNTER — Other Ambulatory Visit (HOSPITAL_COMMUNITY): Payer: Self-pay

## 2022-08-15 ENCOUNTER — Encounter: Payer: Self-pay | Admitting: Orthopedic Surgery

## 2022-08-15 DIAGNOSIS — I10 Essential (primary) hypertension: Secondary | ICD-10-CM

## 2022-08-15 MED ORDER — LISINOPRIL 20 MG PO TABS
20.0000 mg | ORAL_TABLET | Freq: Every day | ORAL | 1 refills | Status: DC
  Filled 2022-08-15: qty 90, 90d supply, fill #0
  Filled 2022-11-11: qty 90, 90d supply, fill #1

## 2022-08-15 NOTE — Telephone Encounter (Signed)
Patient requested refill.  ?Pended Rx and sent to Amy for approval due to HIGH ALERT Warning.  ?

## 2022-08-15 NOTE — Telephone Encounter (Signed)
Will refill medication. Need appointment within next 2 months with me in order to continue future refills. I have seen him once since new patient encounter. I asked for 4 week f/u at that time.

## 2022-08-16 ENCOUNTER — Other Ambulatory Visit (HOSPITAL_COMMUNITY): Payer: Self-pay

## 2022-08-28 ENCOUNTER — Other Ambulatory Visit: Payer: Self-pay | Admitting: Endocrinology

## 2022-08-28 ENCOUNTER — Other Ambulatory Visit (HOSPITAL_COMMUNITY): Payer: Self-pay

## 2022-08-28 DIAGNOSIS — E1165 Type 2 diabetes mellitus with hyperglycemia: Secondary | ICD-10-CM

## 2022-08-28 MED ORDER — MOUNJARO 5 MG/0.5ML ~~LOC~~ SOAJ
5.0000 mg | SUBCUTANEOUS | 2 refills | Status: DC
  Filled 2022-08-28: qty 2, 28d supply, fill #0
  Filled 2022-09-22: qty 2, 28d supply, fill #1

## 2022-09-04 ENCOUNTER — Other Ambulatory Visit: Payer: Self-pay

## 2022-09-04 ENCOUNTER — Other Ambulatory Visit: Payer: Self-pay | Admitting: Family Medicine

## 2022-09-06 ENCOUNTER — Other Ambulatory Visit (HOSPITAL_COMMUNITY): Payer: Self-pay

## 2022-09-07 ENCOUNTER — Other Ambulatory Visit (HOSPITAL_COMMUNITY): Payer: Self-pay

## 2022-09-09 ENCOUNTER — Other Ambulatory Visit (HOSPITAL_COMMUNITY): Payer: Self-pay

## 2022-09-10 ENCOUNTER — Other Ambulatory Visit: Payer: Self-pay | Admitting: Endocrinology

## 2022-09-10 ENCOUNTER — Other Ambulatory Visit: Payer: Self-pay | Admitting: Orthopedic Surgery

## 2022-09-10 ENCOUNTER — Other Ambulatory Visit (HOSPITAL_COMMUNITY): Payer: Self-pay

## 2022-09-10 MED ORDER — FUROSEMIDE 20 MG PO TABS
20.0000 mg | ORAL_TABLET | Freq: Every day | ORAL | 0 refills | Status: DC
  Filled 2022-09-10: qty 30, 30d supply, fill #0

## 2022-09-11 ENCOUNTER — Other Ambulatory Visit: Payer: Self-pay

## 2022-09-11 ENCOUNTER — Other Ambulatory Visit (HOSPITAL_COMMUNITY): Payer: Self-pay

## 2022-09-11 DIAGNOSIS — E1165 Type 2 diabetes mellitus with hyperglycemia: Secondary | ICD-10-CM

## 2022-09-11 MED ORDER — DAPAGLIFLOZIN PROPANEDIOL 5 MG PO TABS
10.0000 mg | ORAL_TABLET | Freq: Every day | ORAL | 2 refills | Status: DC
  Filled 2022-09-11: qty 60, fill #0
  Filled 2022-09-12: qty 60, 30d supply, fill #0

## 2022-09-12 ENCOUNTER — Other Ambulatory Visit: Payer: Self-pay

## 2022-09-12 ENCOUNTER — Other Ambulatory Visit (HOSPITAL_COMMUNITY): Payer: Self-pay

## 2022-09-12 ENCOUNTER — Encounter: Payer: Self-pay | Admitting: Endocrinology

## 2022-09-12 DIAGNOSIS — E1165 Type 2 diabetes mellitus with hyperglycemia: Secondary | ICD-10-CM

## 2022-09-12 MED ORDER — DAPAGLIFLOZIN PROPANEDIOL 10 MG PO TABS
10.0000 mg | ORAL_TABLET | Freq: Every day | ORAL | 2 refills | Status: DC
  Filled 2022-09-12: qty 30, 30d supply, fill #0
  Filled 2022-10-11 (×2): qty 30, 30d supply, fill #1
  Filled 2022-11-04: qty 30, 30d supply, fill #2

## 2022-09-19 DIAGNOSIS — E113291 Type 2 diabetes mellitus with mild nonproliferative diabetic retinopathy without macular edema, right eye: Secondary | ICD-10-CM | POA: Diagnosis not present

## 2022-09-19 DIAGNOSIS — D3132 Benign neoplasm of left choroid: Secondary | ICD-10-CM | POA: Diagnosis not present

## 2022-09-19 DIAGNOSIS — H40013 Open angle with borderline findings, low risk, bilateral: Secondary | ICD-10-CM | POA: Diagnosis not present

## 2022-09-19 LAB — HM DIABETES EYE EXAM

## 2022-09-30 ENCOUNTER — Other Ambulatory Visit (INDEPENDENT_AMBULATORY_CARE_PROVIDER_SITE_OTHER): Payer: Commercial Managed Care - PPO

## 2022-09-30 DIAGNOSIS — Z794 Long term (current) use of insulin: Secondary | ICD-10-CM

## 2022-09-30 DIAGNOSIS — E782 Mixed hyperlipidemia: Secondary | ICD-10-CM | POA: Diagnosis not present

## 2022-09-30 DIAGNOSIS — E1165 Type 2 diabetes mellitus with hyperglycemia: Secondary | ICD-10-CM | POA: Diagnosis not present

## 2022-10-01 LAB — LIPID PANEL
Cholesterol: 165 mg/dL (ref 0–200)
HDL: 34.1 mg/dL — ABNORMAL LOW (ref >39.00)
NonHDL: 130.87
Total CHOL/HDL Ratio: 5
Triglycerides: 335 mg/dL — ABNORMAL HIGH (ref 0.0–149.0)
VLDL: 67 mg/dL — ABNORMAL HIGH (ref 0.0–40.0)

## 2022-10-01 LAB — COMPREHENSIVE METABOLIC PANEL
ALT: 39 U/L (ref 0–53)
AST: 33 U/L (ref 0–37)
Albumin: 4.3 g/dL (ref 3.5–5.2)
Alkaline Phosphatase: 52 U/L (ref 39–117)
BUN: 15 mg/dL (ref 6–23)
CO2: 30 mEq/L (ref 19–32)
Calcium: 10.5 mg/dL (ref 8.4–10.5)
Chloride: 100 mEq/L (ref 96–112)
Creatinine, Ser: 1.03 mg/dL (ref 0.40–1.50)
GFR: 82.56 mL/min (ref >60.00)
Glucose, Bld: 137 mg/dL — ABNORMAL HIGH (ref 70–99)
Potassium: 4.6 mEq/L (ref 3.5–5.1)
Sodium: 139 mEq/L (ref 135–145)
Total Bilirubin: 0.7 mg/dL (ref 0.2–1.2)
Total Protein: 7.2 g/dL (ref 6.0–8.3)

## 2022-10-01 LAB — LDL CHOLESTEROL, DIRECT: Direct LDL: 80 mg/dL

## 2022-10-01 LAB — HEMOGLOBIN A1C: Hgb A1c MFr Bld: 8.8 % — ABNORMAL HIGH (ref 4.6–6.5)

## 2022-10-02 ENCOUNTER — Encounter: Payer: Self-pay | Admitting: Endocrinology

## 2022-10-04 ENCOUNTER — Other Ambulatory Visit (HOSPITAL_COMMUNITY): Payer: Self-pay

## 2022-10-04 ENCOUNTER — Other Ambulatory Visit: Payer: Self-pay | Admitting: Endocrinology

## 2022-10-04 ENCOUNTER — Other Ambulatory Visit: Payer: Self-pay | Admitting: Orthopedic Surgery

## 2022-10-04 ENCOUNTER — Other Ambulatory Visit: Payer: Self-pay

## 2022-10-04 MED ORDER — FUROSEMIDE 20 MG PO TABS
20.0000 mg | ORAL_TABLET | Freq: Every day | ORAL | 0 refills | Status: DC
  Filled 2022-10-04: qty 30, 30d supply, fill #0

## 2022-10-04 MED ORDER — ROSUVASTATIN CALCIUM 5 MG PO TABS
5.0000 mg | ORAL_TABLET | Freq: Every day | ORAL | 3 refills | Status: DC
  Filled 2022-10-04: qty 30, 30d supply, fill #0
  Filled 2022-11-02: qty 30, 30d supply, fill #1
  Filled 2022-11-30: qty 30, 30d supply, fill #2
  Filled 2022-12-18: qty 30, 30d supply, fill #3

## 2022-10-07 ENCOUNTER — Ambulatory Visit: Payer: Commercial Managed Care - PPO | Admitting: Endocrinology

## 2022-10-07 ENCOUNTER — Other Ambulatory Visit (HOSPITAL_COMMUNITY): Payer: Self-pay

## 2022-10-07 ENCOUNTER — Encounter: Payer: Self-pay | Admitting: Endocrinology

## 2022-10-07 VITALS — BP 130/74 | HR 65 | Ht 69.0 in | Wt 306.8 lb

## 2022-10-07 DIAGNOSIS — Z794 Long term (current) use of insulin: Secondary | ICD-10-CM

## 2022-10-07 DIAGNOSIS — E1165 Type 2 diabetes mellitus with hyperglycemia: Secondary | ICD-10-CM

## 2022-10-07 DIAGNOSIS — E782 Mixed hyperlipidemia: Secondary | ICD-10-CM | POA: Diagnosis not present

## 2022-10-07 MED ORDER — TIRZEPATIDE 10 MG/0.5ML ~~LOC~~ SOAJ
10.0000 mg | SUBCUTANEOUS | 1 refills | Status: DC
  Filled 2022-10-07 – 2022-10-11 (×2): qty 2, 28d supply, fill #0
  Filled 2022-11-20: qty 2, 28d supply, fill #1

## 2022-10-07 MED ORDER — FREESTYLE LIBRE 3 SENSOR MISC
1.0000 | 2 refills | Status: DC
  Filled 2022-10-07: qty 2, 28d supply, fill #0

## 2022-10-07 NOTE — Patient Instructions (Signed)
Insulin 1/2 hr before meals

## 2022-10-07 NOTE — Progress Notes (Signed)
Patient ID: Larry Rose, male   DOB: 04-13-1966, 57 y.o.   MRN: [id number]            Reason for Appointment: Type II Diabetes follow-up   History of Present Illness   Diagnosis date: 1993  Previous history:  Non-insulin hypoglycemic drugs previously used:?  Metformin, Farxiga, Trulicity Insulin was started in 2009  A1c range in the last few years is: 6.6-9%  Recent history:     Non-insulin hypoglycemic drugs: Farxiga 10 mg daily,     Insulin regimen: Humulin R before meals 150 - 130 - 140 units       Side effects from medications: diarrhea from Trulicity  Current self management, blood sugar patterns and problems identified:  A1c is 8.8 Not clear why his A1c is much higher than before He says that at times he has been too busy to take his insulin coverage at lunchtime at work but this may be only occasionally At times he will take only 90 to 100 units instead of the full dose at lunch if he is late in doing the injection Again because of lack of motivation he is not checking his blood sugars and not clear if he has a functioning meter He has been able to tolerate Mounjaro 5 mg weekly and no diarrhea with this compared to Trulicity However has lost only 4 pounds, still not likely consistent with diet However he thinks he is eliminating alcohol more recently Taking Farxiga regularly Lab glucose 137 mid afternoon Also no hypoglycemia including overnight  Exercise: No formal exercise, is walking throughout the day at work  Diet management: Not planning meals well and eating quick meals  Glucometer: Freestyle lite  Dietician visit: Most recent: Years ago     Weight control:  Wt Readings from Last 3 Encounters:  10/07/22 (!) 306 lb 12.8 oz (139.2 kg)  05/27/22 (!) 310 lb 3.2 oz (140.7 kg)  04/11/22 (!) 308 lb 9.6 oz (140 kg)            Diabetes labs:  Lab Results  Component Value Date   HGBA1C 8.8 (H) 09/30/2022   HGBA1C 7.4 (H) 05/22/2022   HGBA1C 6.5  (A) 10/31/2021   Lab Results  Component Value Date   MICROALBUR <0.7 05/22/2022   [place]  02/09/2021     Comment:     . LDL cholesterol not calculated. Triglyceride levels greater than 400 mg/dL invalidate calculated LDL results. . Reference range: <100 . Desirable range <100 mg/dL for primary prevention;   <70 mg/dL for patients with CHD or diabetic patients  with > or = 2 CHD risk factors. Marland Kitchen LDL-C is now calculated using the Martin-Hopkins  calculation, which is a validated novel method providing  better accuracy than the Friedewald equation in the  estimation of LDL-C.  Cresenciano Genre et al. Annamaria Helling. [id number]): 2061-2068  (http://education.[facility].com/faq/FAQ164)    CREATININE 1.03 09/30/2022    No results found for: "FRUCTOSAMINE"   Allergies as of 10/07/2022       Reactions   Enalapril Maleate    Lovastatin         Medication List        Accurate as of October 07, 2022  4:09 PM. If you have any questions, ask your nurse or doctor.          BD Pen Needle Nano 2nd Gen 32G X 4 MM Misc Generic drug: Insulin Pen Needle USE 3 TIMES PER DAY.   TechLite Pen Needles 32G X 4  MM Misc Generic drug: Insulin Pen Needle use 3 times per day   Contour Next Test test strip Generic drug: glucose blood 1 each by Other route in the morning and at bedtime. And lancets 3/day   Farxiga 10 MG Tabs tablet Generic drug: dapagliflozin propanediol Take 1 tablet (10 mg total) by mouth daily before breakfast.   furosemide 20 MG tablet Commonly known as: LASIX Take 1 tablet (20 mg total) by mouth daily.   HumuLIN R U-500 KwikPen 500 UNIT/ML KwikPen Generic drug: insulin regular human CONCENTRATED Inject 3 times a day into the skin (just before each meal) 150-140-150 units.   hydrocortisone 25 MG suppository Commonly known as: ANUSOL-HC Place 1 suppository (25 mg total) rectally 2 (two) times daily.   lisinopril 20 MG tablet Commonly known as: ZESTRIL Take 1  tablet (20 mg total) by mouth daily.   meloxicam 15 MG tablet Commonly known as: MOBIC TAKE 1 TABLET EVERY MORNING WITH A MEAL FOR 2 WEEKS,THEN DAILY FOR PAIN   Mitigare 0.6 MG Caps Generic drug: Colchicine TAKE 2 TABS (1.2MG) BY MOUTH FOLLOWED 1 TAB 1 HOUR LATER. CONTINUE 1 TAB DAILY UNTIL RESOLUTION.   Mounjaro 5 MG/0.5ML Pen Generic drug: tirzepatide Inject 5 mg into the skin once a week.   multivitamin tablet Take 1 tablet by mouth daily.   rosuvastatin 5 MG tablet Commonly known as: Crestor Take 1 tablet (5 mg total) by mouth daily.   triamcinolone ointment 0.1 % Commonly known as: KENALOG Apply 1 application topically to itchy scaly patches 2 (two) times daily.        Allergies:  Allergies  Allergen Reactions   Enalapril Maleate    Lovastatin     Past Medical History:  Diagnosis Date   ALCOHOLISM 11/30/2009   ALLERGIC RHINITIS 04/01/2007   CARPAL TUNNEL SYNDROME, RIGHT 10/06/2008   DIABETES MELLITUS, TYPE II 04/01/2007   Hepatic steatosis    HYPERLIPIDEMIA, ATHEROGENIC 09/29/2007   HYPERTENSION 04/01/2007   Rosacea 05/14/2010   TINEA CRURIS 11/07/2008    Past Surgical History:  Procedure Laterality Date   carpel tunnel     FRACTURE SURGERY      Family History  Problem Relation Age of Onset   Hypertension Father    Pneumonia Father    Diabetes Maternal Grandmother    Heart attack Maternal Grandfather    Stroke Paternal Grandfather    Cancer Neg Hx     Social History:  reports that he has never smoked. He has never used smokeless tobacco. He reports current alcohol use. He reports that he does not currently use drugs.  Review of Systems:  Last diabetic eye exam date  Last urine microalbumin date:  Last foot exam date:  Symptoms of neuropathy:none  Hypertension:   ACE/ARB medication:  BP Readings from Last 3 Encounters:  10/07/22 130/74  05/27/22 112/60  04/11/22 (!) 144/82    Lipid management: Not taking Crestor 5 mg daily with  significant improvement in his lipids except triglycerides  He is trying to cut back on alcohol intake  Lab Results  Component Value Date   CHOL 165 09/30/2022   CHOL 244 (H) 05/22/2022   CHOL 237 (H) 02/09/2021   Lab Results  Component Value Date   HDL 34.10 (L) 09/30/2022   HDL 42.20 05/22/2022   HDL 40 02/09/2021   Lab Results  Component Value Date   LDLCALC  02/09/2021     Comment:     . LDL cholesterol not calculated. Triglyceride levels greater  than 400 mg/dL invalidate calculated LDL results. . Reference range: <100 . Desirable range <100 mg/dL for primary prevention;   <70 mg/dL for patients with CHD or diabetic patients  with > or = 2 CHD risk factors. Marland Kitchen LDL-C is now calculated using the Martin-Hopkins  calculation, which is a validated novel method providing  better accuracy than the Friedewald equation in the  estimation of LDL-C.  Cresenciano Genre et al. Annamaria Helling. [id number]): 2061-2068  (http://education.[facility].com/faq/FAQ164)    LDLCALC NOT CALC 08/23/2014   Lab Results  Component Value Date   TRIG 335.0 (H) 09/30/2022   TRIG (H) 05/22/2022    401.0 Triglyceride is over 400; calculations on Lipids are invalid.   TRIG 514 (H) 02/09/2021   Lab Results  Component Value Date   CHOLHDL 5 09/30/2022   CHOLHDL 6 05/22/2022   CHOLHDL 5.9 (H) 02/09/2021   Lab Results  Component Value Date   LDLDIRECT 80.0 09/30/2022   LDLDIRECT 123.0 05/22/2022   LDLDIRECT 112 (H) 02/09/2021    Unable to calculate Fibrosis 4 Score. Requires ALT, AST, and platelet count within the last 6 months.      Examination:   BP 130/74 (BP Location: Left Arm, Patient Position: Sitting, Cuff Size: Normal)   Pulse 65   Ht 5' 9"$  (1.753 m)   Wt (!) 306 lb 12.8 oz (139.2 kg)   SpO2 97%   BMI 45.31 kg/m   Body mass index is 45.31 kg/m.    ASSESSMENT/ PLAN:    Diabetes type 2 on insulin:   Current regimen: Mounjaro 5 mg, Farxiga 10 mg and Humulin R U-500 insulin  See  history of present illness for detailed discussion of current diabetes management, blood sugar patterns and problems identified  A1c is 8.8  Difficulties with current control, monitoring, management of his insulin and other medications was discussed in detail  Recommendations:  After his current prescription runs out he will go to 10 mg Mounjaro To call if he has any difficulties with this dose Discussed benefits of the CGM and he is agreeable to trying the [place] 3 if it is covered Discussed in detail the actions and timing of injection of U-500 insulin or and can take it 30 to 60 minutes before eating instead of taking it right at mealtimes or later No change in dose unless blood sugars show abnormal patterns on CGM If he is unable to get the CGM we will need to start fingersticks regularly Consultation with diabetes educator for comprehensive education and start CGM if needing help  HYPERLIPIDEMIA: This is proved with Crestor although triglycerides still high likely to be from continued insulin resistance, hyperglycemia has cut down on alcohol intake  There are no Patient Instructions on file for this visit.    Elayne Snare 10/07/2022, 4:09 PM

## 2022-10-08 ENCOUNTER — Other Ambulatory Visit (HOSPITAL_COMMUNITY): Payer: Self-pay

## 2022-10-10 ENCOUNTER — Other Ambulatory Visit (HOSPITAL_COMMUNITY): Payer: Self-pay

## 2022-10-11 ENCOUNTER — Other Ambulatory Visit (HOSPITAL_COMMUNITY): Payer: Self-pay

## 2022-10-21 ENCOUNTER — Other Ambulatory Visit (HOSPITAL_COMMUNITY): Payer: Self-pay

## 2022-10-24 ENCOUNTER — Other Ambulatory Visit: Payer: Self-pay

## 2022-11-02 ENCOUNTER — Other Ambulatory Visit: Payer: Self-pay | Admitting: Orthopedic Surgery

## 2022-11-04 ENCOUNTER — Other Ambulatory Visit (HOSPITAL_COMMUNITY): Payer: Self-pay

## 2022-11-04 MED ORDER — FUROSEMIDE 20 MG PO TABS
20.0000 mg | ORAL_TABLET | Freq: Every day | ORAL | 0 refills | Status: DC
  Filled 2022-11-04: qty 5, 5d supply, fill #0

## 2022-11-07 ENCOUNTER — Other Ambulatory Visit (HOSPITAL_COMMUNITY): Payer: Self-pay

## 2022-11-11 ENCOUNTER — Other Ambulatory Visit: Payer: Self-pay | Admitting: Orthopedic Surgery

## 2022-11-11 ENCOUNTER — Other Ambulatory Visit (HOSPITAL_COMMUNITY): Payer: Self-pay

## 2022-11-11 MED ORDER — FUROSEMIDE 20 MG PO TABS
20.0000 mg | ORAL_TABLET | Freq: Every day | ORAL | 0 refills | Status: DC
  Filled 2022-11-11: qty 5, 5d supply, fill #0

## 2022-11-11 NOTE — Telephone Encounter (Signed)
Left message on voicemail for patient to return call when available. Reason for call: Patient is overdue for an appointment.

## 2022-11-12 ENCOUNTER — Other Ambulatory Visit: Payer: Self-pay

## 2022-11-12 ENCOUNTER — Other Ambulatory Visit (HOSPITAL_COMMUNITY): Payer: Self-pay

## 2022-11-14 ENCOUNTER — Ambulatory Visit: Payer: Commercial Managed Care - PPO | Admitting: Orthopedic Surgery

## 2022-11-14 ENCOUNTER — Encounter: Payer: Self-pay | Admitting: Orthopedic Surgery

## 2022-11-14 ENCOUNTER — Other Ambulatory Visit (HOSPITAL_COMMUNITY): Payer: Self-pay

## 2022-11-14 VITALS — BP 118/70 | HR 67 | Temp 96.5°F | Resp 18 | Ht 69.0 in | Wt 300.6 lb

## 2022-11-14 DIAGNOSIS — Z794 Long term (current) use of insulin: Secondary | ICD-10-CM | POA: Diagnosis not present

## 2022-11-14 DIAGNOSIS — Z6841 Body Mass Index (BMI) 40.0 and over, adult: Secondary | ICD-10-CM

## 2022-11-14 DIAGNOSIS — R600 Localized edema: Secondary | ICD-10-CM

## 2022-11-14 DIAGNOSIS — E1165 Type 2 diabetes mellitus with hyperglycemia: Secondary | ICD-10-CM | POA: Diagnosis not present

## 2022-11-14 DIAGNOSIS — F1010 Alcohol abuse, uncomplicated: Secondary | ICD-10-CM | POA: Diagnosis not present

## 2022-11-14 DIAGNOSIS — I10 Essential (primary) hypertension: Secondary | ICD-10-CM | POA: Diagnosis not present

## 2022-11-14 DIAGNOSIS — Z1211 Encounter for screening for malignant neoplasm of colon: Secondary | ICD-10-CM

## 2022-11-14 MED ORDER — LISINOPRIL 20 MG PO TABS
20.0000 mg | ORAL_TABLET | Freq: Every day | ORAL | 1 refills | Status: DC
  Filled 2022-11-14 – 2023-02-07 (×2): qty 90, 90d supply, fill #0
  Filled 2023-05-08: qty 90, 90d supply, fill #1

## 2022-11-14 MED ORDER — FUROSEMIDE 20 MG PO TABS
20.0000 mg | ORAL_TABLET | Freq: Every day | ORAL | 0 refills | Status: DC | PRN
  Filled 2022-11-14: qty 5, 5d supply, fill #0

## 2022-11-14 NOTE — Progress Notes (Signed)
Careteam: Patient Care Team: Yvonna Alanis, NP as PCP - General (Adult Health Nurse Practitioner)  Seen by: Windell Moulding, AGNP-C  PLACE OF SERVICE:  [place] Directive information    Allergies  Allergen Reactions   Enalapril Maleate    Lovastatin     Chief Complaint  Patient presents with   Medical Management of Chronic Issues    Patient is requesting prescription refill.      HPI: Patient is a 57 y.o. male seen today for medical management of chronic conditions.   Followed by Dr. Dwyane Dee for T2DM. Does nont check blood sugar. Remains on Monjuaro, Farxiga, Humulin and ACE, statin. Diabetic eye exam Dr. Peter Garter last month.   BP controlled. Remains on lisinopril. He came late to appointment today. Plan to do EKG in 4 weeks. Denies chest pain, sob, blurred vision and headaches. He only takes furosemide when ankles are mildly swollen. Requesting lisinopril refill.   Obesity- BMI 44.39. Lost 30 lbs in past few years. Unable to lose additional weight. He believes it is due to stress. Discussed [facility]> not interested at this time.   Quit drinking alcohol 2 months ago.   Cologuard completed 2019. Agrees to have new study.   Not interested in additional covid vaccines.    Review of Systems:  Review of Systems  Constitutional: Negative.   HENT: Negative.    Eyes: Negative.   Respiratory: Negative.    Cardiovascular:  Positive for leg swelling.  Gastrointestinal: Negative.   Genitourinary: Negative.   Musculoskeletal: Negative.   Skin: Negative.   Neurological: Negative.   Psychiatric/Behavioral: Negative.      Past Medical History:  Diagnosis Date   ALCOHOLISM 11/30/2009   ALLERGIC RHINITIS 04/01/2007   CARPAL TUNNEL SYNDROME, RIGHT 10/06/2008   DIABETES MELLITUS, TYPE II 04/01/2007   Hepatic steatosis    HYPERLIPIDEMIA, ATHEROGENIC 09/29/2007   HYPERTENSION 04/01/2007   Rosacea 05/14/2010   TINEA CRURIS 11/07/2008   Past Surgical  History:  Procedure Laterality Date   carpel tunnel     FRACTURE SURGERY     Social History:   reports that he has never smoked. He has never used smokeless tobacco. He reports current alcohol use. He reports that he does not currently use drugs.  Family History  Problem Relation Age of Onset   Hypertension Father    Pneumonia Father    Diabetes Maternal Grandmother    Heart attack Maternal Grandfather    Stroke Paternal Grandfather    Cancer Neg Hx     Medications: Patient's Medications  New Prescriptions   No medications on file  Previous Medications   BD PEN NEEDLE NANO 2ND GEN 32G X 4 MM MISC    USE 3 TIMES PER DAY.   CONTINUOUS BLOOD GLUC SENSOR (FREESTYLE LIBRE 3 SENSOR) MISC    Apply 1 sensor on upper arm every 14 days for continuous glucose monitoring   DAPAGLIFLOZIN PROPANEDIOL (FARXIGA) 10 MG TABS TABLET    Take 1 tablet (10 mg total) by mouth daily before breakfast.   FUROSEMIDE (LASIX) 20 MG TABLET    Take 1 tablet (20 mg total) by mouth daily. OVERDUE FOR AN APPOINTMENT   GLUCOSE BLOOD (CONTOUR NEXT TEST) TEST STRIP    1 each by Other route in the morning and at bedtime. And lancets 3/day   HYDROCORTISONE (ANUSOL-HC) 25 MG SUPPOSITORY    Place 1 suppository (25 mg total) rectally 2 (two) times daily.   INSULIN PEN  NEEDLE (UNIFINE PENTIPS) 32G X 4 MM MISC    use 3 times per day   INSULIN REGULAR HUMAN CONCENTRATED (HUMULIN R U-500 KWIKPEN) 500 UNIT/ML KWIKPEN    Inject 3 times a day into the skin (just before each meal) 150-140-150 units.   LISINOPRIL (ZESTRIL) 20 MG TABLET    Take 1 tablet (20 mg total) by mouth daily.   MELOXICAM (MOBIC) 15 MG TABLET    TAKE 1 TABLET EVERY MORNING WITH A MEAL FOR 2 WEEKS,THEN DAILY FOR PAIN   MITIGARE 0.6 MG CAPS    TAKE 2 TABS (1.2MG ) BY MOUTH FOLLOWED 1 TAB 1 HOUR LATER. CONTINUE 1 TAB DAILY UNTIL RESOLUTION.   MULTIPLE VITAMIN (MULTIVITAMIN) TABLET    Take 1 tablet by mouth daily.   ROSUVASTATIN (CRESTOR) 5 MG TABLET    Take 1  tablet (5 mg total) by mouth daily.   TIRZEPATIDE (MOUNJARO) 10 MG/0.5ML PEN    Inject 10 mg into the skin once a week.   TRIAMCINOLONE OINTMENT (KENALOG) 0.1 %    Apply 1 application topically to itchy scaly patches 2 (two) times daily.  Modified Medications   No medications on file  Discontinued Medications   No medications on file    Physical Exam:  There were no vitals filed for this visit. There is no height or weight on file to calculate BMI. Wt Readings from Last 3 Encounters:  10/07/22 (!) 306 lb 12.8 oz (139.2 kg)  05/27/22 (!) 310 lb 3.2 oz (140.7 kg)  04/11/22 (!) 308 lb 9.6 oz (140 kg)    Physical Exam Vitals reviewed.  Constitutional:      General: He is not in acute distress.    Appearance: He is obese.  HENT:     Head: Normocephalic.  Eyes:     General:        Right eye: No discharge.        Left eye: No discharge.  Cardiovascular:     Rate and Rhythm: Normal rate and regular rhythm.     Pulses: Normal pulses.     Heart sounds: Normal heart sounds.  Pulmonary:     Effort: Pulmonary effort is normal. No respiratory distress.     Breath sounds: Normal breath sounds. No wheezing.  Abdominal:     General: Bowel sounds are normal. There is no distension.     Palpations: Abdomen is soft.     Tenderness: There is no abdominal tenderness.  Musculoskeletal:     Cervical back: Neck supple.     Right lower leg: No edema.     Left lower leg: No edema.  Skin:    General: Skin is warm and dry.     Capillary Refill: Capillary refill takes less than 2 seconds.  Neurological:     General: No focal deficit present.     Mental Status: He is alert and oriented to person, place, and time.  Psychiatric:        Mood and Affect: Mood normal.        Behavior: Behavior normal.     Labs reviewed: Basic Metabolic Panel: Recent Labs    05/22/22 1550 09/30/22 1549  NA 138 139  K 4.4 4.6  CL 99 100  CO2 31 30  GLUCOSE 146* 137*  BUN 20 15  CREATININE 0.99 1.03   CALCIUM 9.9 10.5   Liver Function Tests: Recent Labs    05/22/22 1550 09/30/22 1549  AST 31 33  ALT 32 39  ALKPHOS 55  52  BILITOT 0.6 0.7  PROT 7.4 7.2  ALBUMIN 4.3 4.3   No results for input(s): "LIPASE", "AMYLASE" in the last 8760 hours. No results for input(s): "AMMONIA" in the last 8760 hours. CBC: No results for input(s): "WBC", "NEUTROABS", "HGB", "HCT", "MCV", "PLT" in the last 8760 hours. Lipid Panel: Recent Labs    05/22/22 1550 09/30/22 1549  CHOL 244* 165  HDL 42.20 34.10*  TRIG 401.0 Triglyceride is over 400; calculations on Lipids are invalid.* 335.0*  CHOLHDL 6 5  LDLDIRECT 123.0 80.0   TSH: No results for input(s): "TSH" in the last 8760 hours. A1C: Lab Results  Component Value Date   HGBA1C 8.8 (H) 09/30/2022     Assessment/Plan 1. Essential hypertension - controlled - BUN/creat 15/1.03 09/30/2022 - cont lisinopril - EKG in 4 weeks  2. Type 2 diabetes mellitus with hyperglycemia, with long-term current use of insulin (HCC) - A1c 8.8 - followed by Dr. Dwyane Dee - now on [place], Farxiga and SSI - does not check sugars - denies hypoglycemias - reports diabetic eye exam last month with Dr. Peter Garter will contact his office - cont ACE and lisinopril  3. Morbid obesity with BMI of 45.0-49.9, adult (HCC) - BMI 44.39 - refused [facility] referral - discussed limiting calories and portion sizes, < 2000 calories/day - recommend exercise 150 min week  4. Colon cancer screening - cologuard 2019> will reorder today  5. Alcohol use disorder, mild, abuse - quit drinking 2 months ago - AST/ALT normal  6. Edema of both extremities - no edema - change furosemide to daily prn  Total time: 33 minutes. Greater than 50% of total time spent doing patient education regarding health maintenance, T2DM, HTN and weight loss inclusing symptom/medication management.    Next appt: Visit date not found  [place] [phone]

## 2022-11-14 NOTE — Patient Instructions (Addendum)
Please schedule in 1 month for EKG  Recommend eating < 2000 calories daily  Recommend exercise 150 minutes/ week  Consider getting Shingrix> shingles vaccine at local pharmacy

## 2022-11-15 ENCOUNTER — Other Ambulatory Visit (HOSPITAL_COMMUNITY): Payer: Self-pay

## 2022-11-21 ENCOUNTER — Other Ambulatory Visit (HOSPITAL_COMMUNITY): Payer: Self-pay

## 2022-12-02 ENCOUNTER — Other Ambulatory Visit (HOSPITAL_COMMUNITY): Payer: Self-pay

## 2022-12-02 ENCOUNTER — Other Ambulatory Visit: Payer: Self-pay

## 2022-12-05 ENCOUNTER — Other Ambulatory Visit (HOSPITAL_COMMUNITY): Payer: Self-pay

## 2022-12-05 DIAGNOSIS — Z1211 Encounter for screening for malignant neoplasm of colon: Secondary | ICD-10-CM | POA: Diagnosis not present

## 2022-12-09 ENCOUNTER — Other Ambulatory Visit (HOSPITAL_COMMUNITY): Payer: Self-pay

## 2022-12-09 ENCOUNTER — Other Ambulatory Visit: Payer: Self-pay | Admitting: Endocrinology

## 2022-12-09 DIAGNOSIS — E1165 Type 2 diabetes mellitus with hyperglycemia: Secondary | ICD-10-CM

## 2022-12-09 MED ORDER — DAPAGLIFLOZIN PROPANEDIOL 10 MG PO TABS
10.0000 mg | ORAL_TABLET | Freq: Every day | ORAL | 2 refills | Status: DC
  Filled 2022-12-09: qty 30, 30d supply, fill #0
  Filled 2023-01-06: qty 30, 30d supply, fill #1
  Filled 2023-02-07: qty 30, 30d supply, fill #2

## 2022-12-12 ENCOUNTER — Ambulatory Visit: Payer: Commercial Managed Care - PPO | Admitting: Orthopedic Surgery

## 2022-12-13 ENCOUNTER — Other Ambulatory Visit (HOSPITAL_COMMUNITY): Payer: Self-pay

## 2022-12-13 LAB — COLOGUARD: COLOGUARD: NEGATIVE

## 2022-12-18 ENCOUNTER — Other Ambulatory Visit (HOSPITAL_COMMUNITY): Payer: Self-pay

## 2022-12-18 ENCOUNTER — Other Ambulatory Visit: Payer: Self-pay | Admitting: Endocrinology

## 2022-12-19 ENCOUNTER — Other Ambulatory Visit (HOSPITAL_COMMUNITY): Payer: Self-pay

## 2022-12-19 MED ORDER — MOUNJARO 10 MG/0.5ML ~~LOC~~ SOAJ
10.0000 mg | SUBCUTANEOUS | 1 refills | Status: DC
  Filled 2022-12-19: qty 2, 28d supply, fill #0
  Filled 2023-01-12: qty 2, 28d supply, fill #1

## 2022-12-26 DIAGNOSIS — H02882 Meibomian gland dysfunction right lower eyelid: Secondary | ICD-10-CM | POA: Diagnosis not present

## 2022-12-26 DIAGNOSIS — H16223 Keratoconjunctivitis sicca, not specified as Sjogren's, bilateral: Secondary | ICD-10-CM | POA: Diagnosis not present

## 2022-12-26 DIAGNOSIS — H02885 Meibomian gland dysfunction left lower eyelid: Secondary | ICD-10-CM | POA: Diagnosis not present

## 2022-12-30 ENCOUNTER — Other Ambulatory Visit (HOSPITAL_COMMUNITY): Payer: Self-pay

## 2023-01-12 ENCOUNTER — Other Ambulatory Visit: Payer: Self-pay | Admitting: Endocrinology

## 2023-01-12 MED ORDER — ROSUVASTATIN CALCIUM 5 MG PO TABS
5.0000 mg | ORAL_TABLET | Freq: Every day | ORAL | 3 refills | Status: DC
  Filled 2023-01-12: qty 30, 30d supply, fill #0
  Filled 2023-02-18: qty 30, 30d supply, fill #1
  Filled 2023-03-18: qty 30, 30d supply, fill #2
  Filled 2023-04-14: qty 30, 30d supply, fill #3

## 2023-01-14 ENCOUNTER — Other Ambulatory Visit: Payer: Self-pay

## 2023-01-14 ENCOUNTER — Other Ambulatory Visit (HOSPITAL_COMMUNITY): Payer: Self-pay

## 2023-01-16 ENCOUNTER — Other Ambulatory Visit (HOSPITAL_COMMUNITY): Payer: Self-pay

## 2023-01-16 ENCOUNTER — Other Ambulatory Visit: Payer: Self-pay | Admitting: Orthopedic Surgery

## 2023-01-16 DIAGNOSIS — I10 Essential (primary) hypertension: Secondary | ICD-10-CM

## 2023-01-16 MED ORDER — FUROSEMIDE 20 MG PO TABS
20.0000 mg | ORAL_TABLET | Freq: Every day | ORAL | 3 refills | Status: DC | PRN
Start: 2023-01-16 — End: 2023-05-14
  Filled 2023-01-16: qty 30, 30d supply, fill #0
  Filled 2023-02-18: qty 30, 30d supply, fill #1
  Filled 2023-03-18: qty 30, 30d supply, fill #2
  Filled 2023-04-25: qty 26, 26d supply, fill #3
  Filled 2023-04-25: qty 4, 4d supply, fill #3

## 2023-01-16 NOTE — Telephone Encounter (Signed)
Refill request for furosemide. Prescription written for 5 tablets, is patient suppose to continue medication daily?  Medication pended and sent to Fletcher Anon, NP

## 2023-01-17 ENCOUNTER — Other Ambulatory Visit (HOSPITAL_COMMUNITY): Payer: Self-pay

## 2023-01-27 ENCOUNTER — Other Ambulatory Visit (HOSPITAL_COMMUNITY): Payer: Self-pay

## 2023-01-28 ENCOUNTER — Telehealth: Payer: Self-pay

## 2023-01-28 ENCOUNTER — Other Ambulatory Visit (HOSPITAL_COMMUNITY): Payer: Self-pay

## 2023-01-28 NOTE — Telephone Encounter (Signed)
Patient is unable to get mounjaro 10mg .  Please advise.

## 2023-01-29 ENCOUNTER — Other Ambulatory Visit (HOSPITAL_COMMUNITY): Payer: Self-pay

## 2023-01-30 ENCOUNTER — Other Ambulatory Visit: Payer: Self-pay | Admitting: Orthopedic Surgery

## 2023-01-30 ENCOUNTER — Other Ambulatory Visit (HOSPITAL_COMMUNITY): Payer: Self-pay

## 2023-01-30 MED ORDER — TECHLITE PEN NEEDLES 32G X 4 MM MISC
1.0000 | Freq: Three times a day (TID) | 10 refills | Status: DC
  Filled 2023-01-30: qty 100, 30d supply, fill #0
  Filled 2023-03-04: qty 100, 30d supply, fill #1
  Filled 2023-03-31: qty 100, 30d supply, fill #2
  Filled 2023-05-04: qty 100, 30d supply, fill #3
  Filled 2023-06-05: qty 100, 30d supply, fill #4
  Filled 2023-07-03: qty 100, 30d supply, fill #5
  Filled 2023-08-01: qty 100, 30d supply, fill #6
  Filled 2023-09-01: qty 100, 30d supply, fill #7
  Filled 2023-09-30: qty 100, 30d supply, fill #8
  Filled 2023-10-31: qty 100, 30d supply, fill #9
  Filled 2023-11-29: qty 100, 30d supply, fill #10

## 2023-01-30 MED ORDER — TIRZEPATIDE 7.5 MG/0.5ML ~~LOC~~ SOAJ
7.5000 mg | SUBCUTANEOUS | 2 refills | Status: DC
  Filled 2023-01-30: qty 2, 28d supply, fill #0

## 2023-01-30 NOTE — Telephone Encounter (Signed)
High Risk Warning Populated when attempting to refill, I will send to Provider for further review 

## 2023-02-03 ENCOUNTER — Encounter: Payer: Commercial Managed Care - PPO | Admitting: Endocrinology

## 2023-02-03 ENCOUNTER — Other Ambulatory Visit (INDEPENDENT_AMBULATORY_CARE_PROVIDER_SITE_OTHER): Payer: Commercial Managed Care - PPO

## 2023-02-03 DIAGNOSIS — E1165 Type 2 diabetes mellitus with hyperglycemia: Secondary | ICD-10-CM

## 2023-02-03 DIAGNOSIS — Z794 Long term (current) use of insulin: Secondary | ICD-10-CM | POA: Diagnosis not present

## 2023-02-03 NOTE — Progress Notes (Unsigned)
This encounter was created in error - please disregard.

## 2023-02-04 LAB — BASIC METABOLIC PANEL
BUN: 16 mg/dL (ref 6–23)
CO2: 27 mEq/L (ref 19–32)
Calcium: 9.7 mg/dL (ref 8.4–10.5)
Chloride: 102 mEq/L (ref 96–112)
Creatinine, Ser: 0.91 mg/dL (ref 0.40–1.50)
GFR: 95.56 mL/min (ref >60.00)
Glucose, Bld: 111 mg/dL — ABNORMAL HIGH (ref 70–99)
Potassium: 4 mEq/L (ref 3.5–5.1)
Sodium: 141 mEq/L (ref 135–145)

## 2023-02-04 LAB — HEMOGLOBIN A1C: Hgb A1c MFr Bld: 8.5 % — ABNORMAL HIGH (ref 4.6–6.5)

## 2023-02-06 ENCOUNTER — Encounter: Payer: Self-pay | Admitting: Orthopedic Surgery

## 2023-02-06 ENCOUNTER — Ambulatory Visit: Payer: Commercial Managed Care - PPO | Admitting: Orthopedic Surgery

## 2023-02-06 VITALS — BP 118/74 | HR 64 | Temp 97.0°F | Resp 18 | Ht 69.0 in | Wt 293.8 lb

## 2023-02-06 DIAGNOSIS — Z6841 Body Mass Index (BMI) 40.0 and over, adult: Secondary | ICD-10-CM | POA: Diagnosis not present

## 2023-02-06 DIAGNOSIS — R9431 Abnormal electrocardiogram [ECG] [EKG]: Secondary | ICD-10-CM

## 2023-02-06 DIAGNOSIS — Z794 Long term (current) use of insulin: Secondary | ICD-10-CM | POA: Diagnosis not present

## 2023-02-06 DIAGNOSIS — I10 Essential (primary) hypertension: Secondary | ICD-10-CM | POA: Diagnosis not present

## 2023-02-06 DIAGNOSIS — E1165 Type 2 diabetes mellitus with hyperglycemia: Secondary | ICD-10-CM

## 2023-02-06 NOTE — Progress Notes (Signed)
Careteam: Patient Care Team: Octavia Heir, NP as PCP - General (Adult Health Nurse Practitioner) Gelene Mink, OD as Referring Physician (Optometry)  Seen by: Hazle Nordmann, AGNP-C  PLACE OF SERVICE:  [facility]  Advanced Directive information Does Patient Have a Medical Advance Directive?: No, Would patient like information on creating a medical advance directive?: No - Patient declined  Allergies  Allergen Reactions   Enalapril Maleate    Lovastatin     Chief Complaint  Patient presents with   Acute Visit    Patient is here to do EKG that was missed in April.     HPI: Patient is a 57 y.o. male seen today for medical management of chronic conditions.   HTN- BP 118/74 in office today, BUN/creat 16/0.91 02/03/2023, remains on lisinopril. EKG done today> NSR with QRS widening.   Family h/o heart disease. Maternal grandfather passed age 19 of MI. Stress test 2017. He would like cardiology referral.   A1c 8.5> was 8.8. He is followed by endocrinology. He took first dose of monjuaro this week. Does not like [employer]. Requested standard glucometer from endocrinology.   Triglycerides improved to 335> was 401.   Cologard negative 12/05/2022.   Review of Systems:  Review of Systems  Constitutional: Negative.   HENT: Negative.    Eyes: Negative.   Respiratory:  Negative for cough, shortness of breath and wheezing.   Cardiovascular:  Negative for chest pain and leg swelling.  Gastrointestinal: Negative.   Genitourinary: Negative.   Musculoskeletal: Negative.   Skin: Negative.   Neurological: Negative.   Endo/Heme/Allergies: Negative.   Psychiatric/Behavioral: Negative.      Past Medical History:  Diagnosis Date   ALCOHOLISM 11/30/2009   ALLERGIC RHINITIS 04/01/2007   CARPAL TUNNEL SYNDROME, RIGHT 10/06/2008   DIABETES MELLITUS, TYPE II 04/01/2007   Hepatic steatosis    HYPERLIPIDEMIA, ATHEROGENIC 09/29/2007   HYPERTENSION 04/01/2007   Rosacea 05/14/2010   TINEA  CRURIS 11/07/2008   Past Surgical History:  Procedure Laterality Date   carpel tunnel     FRACTURE SURGERY     Social History:   reports that he has never smoked. He has never used smokeless tobacco. He reports that he does not currently use alcohol. He reports that he does not currently use drugs.  Family History  Problem Relation Age of Onset   Hypertension Father    Pneumonia Father    Diabetes Maternal Grandmother    Heart attack Maternal Grandfather    Stroke Paternal Grandfather    Cancer Neg Hx     Medications: Patient's Medications  New Prescriptions   No medications on file  Previous Medications   BD PEN NEEDLE NANO 2ND GEN 32G X 4 MM MISC    USE 3 TIMES PER DAY.   COLCHICINE (MITIGARE) 0.6 MG CAPS    Take 1 capsule by mouth as needed.   DAPAGLIFLOZIN PROPANEDIOL (FARXIGA) 10 MG TABS TABLET    Take 1 tablet (10 mg total) by mouth daily before breakfast.   FUROSEMIDE (LASIX) 20 MG TABLET    Take 1 tablet (20 mg total) by mouth daily as needed.   INSULIN PEN NEEDLE (TECHLITE PEN NEEDLES) 32G X 4 MM MISC    Use 3 (three) times daily.   INSULIN REGULAR HUMAN CONCENTRATED (HUMULIN R U-500 KWIKPEN) 500 UNIT/ML KWIKPEN    Inject 3 times a day into the skin (just before each meal) 150-140-150 units.   LISINOPRIL (ZESTRIL) 20 MG TABLET    Take  1 tablet (20 mg total) by mouth daily.   MULTIPLE VITAMIN (MULTIVITAMIN) TABLET    Take 1 tablet by mouth daily.   ROSUVASTATIN (CRESTOR) 5 MG TABLET    Take 1 tablet (5 mg total) by mouth daily.   TIRZEPATIDE (MOUNJARO) 10 MG/0.5ML PEN    Inject 10 mg into the skin once a week.   TRIAMCINOLONE CREAM (KENALOG) 0.1 %    Apply 1 Application topically as needed.  Modified Medications   No medications on file  Discontinued Medications   TIRZEPATIDE (MOUNJARO) 7.5 MG/0.5ML PEN    Inject 7.5 mg into the skin once a week.    Physical Exam:  Vitals:   02/06/23 1115  BP: 118/74  Pulse: 64  Resp: 18  Temp: (!) 97 F (36.1 C)  SpO2: 97%   Weight: 293 lb 12.8 oz (133.3 kg)  Height: 5\' 9"  (1.753 m)   Body mass index is 43.39 kg/m. Wt Readings from Last 3 Encounters:  02/06/23 293 lb 12.8 oz (133.3 kg)  11/14/22 (!) 300 lb 9.6 oz (136.4 kg)  10/07/22 (!) 306 lb 12.8 oz (139.2 kg)    Physical Exam Vitals reviewed.  Constitutional:      General: He is not in acute distress.    Appearance: He is obese.  HENT:     Head: Normocephalic.  Eyes:     General:        Right eye: No discharge.        Left eye: No discharge.  Neck:     Vascular: No carotid bruit.     Comments: No JVD Cardiovascular:     Rate and Rhythm: Normal rate and regular rhythm.     Pulses: Normal pulses.     Heart sounds: Normal heart sounds.  Pulmonary:     Effort: Pulmonary effort is normal. No respiratory distress.     Breath sounds: Normal breath sounds. No wheezing.  Abdominal:     General: Bowel sounds are normal.     Palpations: Abdomen is soft.  Musculoskeletal:     Cervical back: Neck supple.     Right lower leg: No edema.     Left lower leg: No edema.  Skin:    General: Skin is warm.     Capillary Refill: Capillary refill takes less than 2 seconds.  Neurological:     General: No focal deficit present.     Mental Status: He is alert and oriented to person, place, and time.  Psychiatric:        Behavior: Behavior normal.     Labs reviewed: Basic Metabolic Panel: Recent Labs    05/22/22 1550 09/30/22 1549 02/03/23 1620  NA 138 139 141  K 4.4 4.6 4.0  CL 99 100 102  CO2 31 30 27   GLUCOSE 146* 137* 111*  BUN 20 15 16   CREATININE 0.99 1.03 0.91  CALCIUM 9.9 10.5 9.7   Liver Function Tests: Recent Labs    05/22/22 1550 09/30/22 1549  AST 31 33  ALT 32 39  ALKPHOS 55 52  BILITOT 0.6 0.7  PROT 7.4 7.2  ALBUMIN 4.3 4.3   No results for input(s): "LIPASE", "AMYLASE" in the last 8760 hours. No results for input(s): "AMMONIA" in the last 8760 hours. CBC: No results for input(s): "WBC", "NEUTROABS", "HGB", "HCT",  "MCV", "PLT" in the last 8760 hours. Lipid Panel: Recent Labs    05/22/22 1550 09/30/22 1549  CHOL 244* 165  HDL 42.20 34.10*  TRIG 401.0 Triglyceride is over  400; calculations on Lipids are invalid.* 335.0*  CHOLHDL 6 5  LDLDIRECT 123.0 80.0   TSH: No results for input(s): "TSH" in the last 8760 hours. A1C: Lab Results  Component Value Date   HGBA1C 8.5 (H) 02/03/2023     Assessment/Plan 1. Essential hypertension - controlled, goal < 130/80 - BUN/creat 16/0.91 02/03/2023 - cont lisinopril - EKG 12-Lead  2. Abnormal EKG - NSR with wide QRS - stress test 2017 - he would like cardiology referral d/t h/o heart disease in family - Ambulatory referral to Cardiology  3. Morbid obesity with BMI of 45.0-49.9, adult (HCC) - BMI 43.39 - weight down 7 lbs  - started on Monjuaro per endocrinology - refused [facility]  - recommend diet < 1800 calories/daily - recommend 150 min exercise weekly   4. Type 2 diabetes mellitus with hyperglycemia, with long-term current use of insulin (HCC) - followed by Dr. Lucianne Muss - A1c 8.5 - on insulin, Farixga and Monjuaro - no hypoglycemia   Total time: 32 minutes. Greater than 50% of total time spent doing patient education regarding health maintenance, T2DM, obesity, HTN, EKG results including symptom/medication management.    Next appt: 05/15/2023  Hazle Nordmann, Juel Burrow  [facility] [phone]

## 2023-02-07 ENCOUNTER — Other Ambulatory Visit: Payer: Self-pay

## 2023-02-07 ENCOUNTER — Other Ambulatory Visit (HOSPITAL_COMMUNITY): Payer: Self-pay

## 2023-02-18 ENCOUNTER — Other Ambulatory Visit: Payer: Self-pay | Admitting: Endocrinology

## 2023-02-18 ENCOUNTER — Other Ambulatory Visit: Payer: Self-pay

## 2023-02-18 ENCOUNTER — Other Ambulatory Visit (HOSPITAL_COMMUNITY): Payer: Self-pay

## 2023-02-18 MED ORDER — MOUNJARO 10 MG/0.5ML ~~LOC~~ SOAJ
10.0000 mg | SUBCUTANEOUS | 1 refills | Status: DC
  Filled 2023-02-18: qty 2, 28d supply, fill #0
  Filled 2023-03-18: qty 2, 28d supply, fill #1

## 2023-02-25 ENCOUNTER — Ambulatory Visit: Payer: Commercial Managed Care - PPO | Admitting: Endocrinology

## 2023-02-25 DIAGNOSIS — Z794 Long term (current) use of insulin: Secondary | ICD-10-CM | POA: Diagnosis not present

## 2023-02-25 DIAGNOSIS — E1165 Type 2 diabetes mellitus with hyperglycemia: Secondary | ICD-10-CM | POA: Diagnosis not present

## 2023-02-25 NOTE — Progress Notes (Signed)
Patient ID: Larry Rose, male   DOB: 03/10/1966, 57 y.o.   MRN: [id number]            Reason for Appointment: Type II Diabetes follow-up   History of Present Illness   Diagnosis date: 1993  Previous history:  Non-insulin hypoglycemic drugs previously used:?  Metformin, Farxiga, Trulicity Insulin was started in 2009  A1c range in the last few years is: 6.6-9%  Recent history:     Non-insulin hypoglycemic drugs: Farxiga 10 mg daily, Mounjaro 10 mg weekly     Insulin regimen: Humulin R before meals 150 - 130 - 140 units       Side effects from medications: diarrhea from Trulicity  Current self management, blood sugar patterns and problems identified:  A1c is 8.5 as of 02/04/2023 He was told to start back on his freestyle libre on the last visit but he has not done so even though this was available on his insurance He says that he may knock the sensor off when working but did not report this problem He is reluctant to use the sensor and wants to use his fingersticks but lately has not done any readings and did not bring any records As of last month appears to have lost some weight with increasing his Mounjaro A1c is not much better, last year was 7.4 He says he is taking his insulin 3 times a day but not always timing it appropriately before his meals Lab glucose late afternoon was 111  Exercise: No formal exercise, is walking throughout the day at work  Diet management: Not planning meals well and eating quick meals  Dietician visit: Most recent: Years ago     Weight control:  Wt Readings from Last 3 Encounters:  02/06/23 293 lb 12.8 oz (133.3 kg)  11/14/22 (!) 300 lb 9.6 oz (136.4 kg)  10/07/22 (!) 306 lb 12.8 oz (139.2 kg)            Diabetes labs:  Lab Results  Component Value Date   HGBA1C 8.5 (H) 02/03/2023   HGBA1C 8.8 (H) 09/30/2022   HGBA1C 7.4 (H) 05/22/2022   Lab Results  Component Value Date   MICROALBUR <0.7 05/22/2022   LDLCALC   02/09/2021     Comment:     . LDL cholesterol not calculated. Triglyceride levels greater than 400 mg/dL invalidate calculated LDL results. . Reference range: <100 . Desirable range <100 mg/dL for primary prevention;   <70 mg/dL for patients with CHD or diabetic patients  with > or = 2 CHD risk factors. Marland Kitchen LDL-C is now calculated using the Martin-Hopkins  calculation, which is a validated novel method providing  better accuracy than the Friedewald equation in the  estimation of LDL-C.  Horald Pollen et al. Lenox Ahr. 4098;119(14): 2061-2068  (http://education.[facility].com/faq/FAQ164)    CREATININE 0.91 02/03/2023    No results found for: "FRUCTOSAMINE"   Allergies as of 02/25/2023       Reactions   Enalapril Maleate    Lovastatin         Medication List        Accurate as of February 25, 2023  5:00 PM. If you have any questions, ask your nurse or doctor.          BD Pen Needle Nano 2nd Gen 32G X 4 MM Misc Generic drug: Insulin Pen Needle USE 3 TIMES PER DAY.   TechLite Plus Pen Needles 32G X 4 MM Misc Generic drug: Insulin Pen Needle Use 3 (three) times  daily.   Farxiga 10 MG Tabs tablet Generic drug: dapagliflozin propanediol Take 1 tablet (10 mg total) by mouth daily before breakfast.   furosemide 20 MG tablet Commonly known as: LASIX Take 1 tablet (20 mg total) by mouth daily as needed.   HumuLIN R U-500 KwikPen 500 UNIT/ML KwikPen Generic drug: insulin regular human CONCENTRATED Inject 3 times a day into the skin (just before each meal) 150-140-150 units. What changed: additional instructions   lisinopril 20 MG tablet Commonly known as: ZESTRIL Take 1 tablet (20 mg total) by mouth daily.   Mitigare 0.6 MG Caps Generic drug: Colchicine Take 1 capsule by mouth as needed.   Mounjaro 10 MG/0.5ML Pen Generic drug: tirzepatide Inject 10 mg into the skin once a week.   multivitamin tablet Take 1 tablet by mouth daily.   rosuvastatin 5 MG  tablet Commonly known as: Crestor Take 1 tablet (5 mg total) by mouth daily.   triamcinolone cream 0.1 % Commonly known as: KENALOG Apply 1 Application topically as needed.        Allergies:  Allergies  Allergen Reactions   Enalapril Maleate    Lovastatin     Past Medical History:  Diagnosis Date   ALCOHOLISM 11/30/2009   ALLERGIC RHINITIS 04/01/2007   CARPAL TUNNEL SYNDROME, RIGHT 10/06/2008   DIABETES MELLITUS, TYPE II 04/01/2007   Hepatic steatosis    HYPERLIPIDEMIA, ATHEROGENIC 09/29/2007   HYPERTENSION 04/01/2007   Rosacea 05/14/2010   TINEA CRURIS 11/07/2008    Past Surgical History:  Procedure Laterality Date   carpel tunnel     FRACTURE SURGERY      Family History  Problem Relation Age of Onset   Hypertension Father    Pneumonia Father    Diabetes Maternal Grandmother    Heart attack Maternal Grandfather    Stroke Paternal Grandfather    Cancer Neg Hx     Social History:  reports that he has never smoked. He has never used smokeless tobacco. He reports that he does not currently use alcohol. He reports that he does not currently use drugs.  Review of Systems:   Symptoms of neuropathy:none  Hypertension: Treated by PCP ACE/ARB medication: Lisinopril 20 mg  BP Readings from Last 3 Encounters:  02/06/23 118/74  11/14/22 118/70  10/07/22 130/74    Lipid management: Currently taking Crestor 5 mg daily with significant improvement in his lipids except triglycerides  Has been encouraged to cut back on alcohol  Lab Results  Component Value Date   CHOL 165 09/30/2022   CHOL 244 (H) 05/22/2022   CHOL 237 (H) 02/09/2021   Lab Results  Component Value Date   HDL 34.10 (L) 09/30/2022   HDL 42.20 05/22/2022   HDL 40 02/09/2021   Lab Results  Component Value Date   LDLCALC  02/09/2021     Comment:     . LDL cholesterol not calculated. Triglyceride levels greater than 400 mg/dL invalidate calculated LDL results. . Reference range:  <100 . Desirable range <100 mg/dL for primary prevention;   <70 mg/dL for patients with CHD or diabetic patients  with > or = 2 CHD risk factors. Marland Kitchen LDL-C is now calculated using the Martin-Hopkins  calculation, which is a validated novel method providing  better accuracy than the Friedewald equation in the  estimation of LDL-C.  Horald Pollen et al. Lenox Ahr. 1610;960(45): 2061-2068  (http://education.[facility].com/faq/FAQ164)    LDLCALC NOT CALC 08/23/2014   Lab Results  Component Value Date   TRIG 335.0 (H) 09/30/2022  TRIG (H) 05/22/2022    401.0 Triglyceride is over 400; calculations on Lipids are invalid.   TRIG 514 (H) 02/09/2021   Lab Results  Component Value Date   CHOLHDL 5 09/30/2022   CHOLHDL 6 05/22/2022   CHOLHDL 5.9 (H) 02/09/2021   Lab Results  Component Value Date   LDLDIRECT 80.0 09/30/2022   LDLDIRECT 123.0 05/22/2022   LDLDIRECT 112 (H) 02/09/2021    Unable to calculate Fibrosis 4 Score. Requires ALT, AST, and platelet count within the last 6 months.      Examination:   There were no vitals taken for this visit.  There is no height or weight on file to calculate BMI.    ASSESSMENT/ PLAN:    Diabetes type 2 on insulin:   Current regimen: Mounjaro 10 mg, Farxiga 10 mg and Humulin R U-500 insulin  See history of present illness for detailed discussion of current diabetes management, blood sugar patterns and problems identified  A1c is 8.5  Difficulties with current control, monitoring, management of his insulin and other medications was discussed in detail  Recommendations:  Discussed importance of using CGM to monitor his sugars, alert him of high and low readings, help adjustment of the insulin doses especially the last dose of the day He also needs to use his CGM to help adjust his diet and establish what makes his blood sugar go up After much persuasion he is finally agreeing to give a trial of the Dexcom sensor with the help of the  diabetes educator today Reassured him that this can be used on his abdomen which will be not affected by his work activities Currently unable to adjust his insulin dose and will reassess on the next visit  There are no Patient Instructions on file for this visit.    Reather Littler 02/25/2023, 5:00 PM

## 2023-03-04 ENCOUNTER — Other Ambulatory Visit: Payer: Self-pay | Admitting: Endocrinology

## 2023-03-04 ENCOUNTER — Other Ambulatory Visit: Payer: Self-pay

## 2023-03-04 ENCOUNTER — Encounter: Payer: Self-pay | Admitting: Endocrinology

## 2023-03-04 ENCOUNTER — Other Ambulatory Visit (HOSPITAL_COMMUNITY): Payer: Self-pay

## 2023-03-04 ENCOUNTER — Telehealth: Payer: Self-pay | Admitting: Nutrition

## 2023-03-04 DIAGNOSIS — E1165 Type 2 diabetes mellitus with hyperglycemia: Secondary | ICD-10-CM

## 2023-03-04 MED ORDER — DAPAGLIFLOZIN PROPANEDIOL 10 MG PO TABS
10.0000 mg | ORAL_TABLET | Freq: Every day | ORAL | 2 refills | Status: DC
Start: 2023-03-04 — End: 2023-06-06
  Filled 2023-03-04: qty 30, 30d supply, fill #0
  Filled 2023-04-09: qty 30, 30d supply, fill #1
  Filled 2023-05-04: qty 30, 30d supply, fill #2

## 2023-03-04 NOTE — Telephone Encounter (Signed)
Texted patient that he can come in anytime until 4:45 today to download his reader

## 2023-03-05 ENCOUNTER — Encounter: Payer: Self-pay | Admitting: Endocrinology

## 2023-03-05 ENCOUNTER — Other Ambulatory Visit (HOSPITAL_COMMUNITY): Payer: Self-pay

## 2023-03-05 MED ORDER — DEXCOM G7 SENSOR MISC
2 refills | Status: DC
Start: 2023-03-05 — End: 2024-03-30
  Filled 2023-03-05: qty 3, 30d supply, fill #0
  Filled 2023-05-04: qty 3, 30d supply, fill #1
  Filled 2023-10-03 – 2023-12-19 (×2): qty 3, 30d supply, fill #2

## 2023-03-31 ENCOUNTER — Other Ambulatory Visit (HOSPITAL_COMMUNITY): Payer: Self-pay

## 2023-04-04 ENCOUNTER — Other Ambulatory Visit (HOSPITAL_COMMUNITY): Payer: Self-pay

## 2023-04-04 ENCOUNTER — Other Ambulatory Visit: Payer: Self-pay | Admitting: Endocrinology

## 2023-04-04 DIAGNOSIS — Z794 Long term (current) use of insulin: Secondary | ICD-10-CM

## 2023-04-07 ENCOUNTER — Other Ambulatory Visit: Payer: Self-pay

## 2023-04-07 ENCOUNTER — Other Ambulatory Visit (HOSPITAL_COMMUNITY): Payer: Self-pay

## 2023-04-07 MED ORDER — HUMULIN R U-500 KWIKPEN 500 UNIT/ML ~~LOC~~ SOPN
PEN_INJECTOR | SUBCUTANEOUS | 1 refills | Status: DC
Start: 2023-04-07 — End: 2023-04-23
  Filled 2023-04-07: qty 24, 28d supply, fill #0

## 2023-04-08 ENCOUNTER — Other Ambulatory Visit (HOSPITAL_COMMUNITY): Payer: Self-pay

## 2023-04-09 ENCOUNTER — Other Ambulatory Visit (HOSPITAL_COMMUNITY): Payer: Self-pay

## 2023-04-09 ENCOUNTER — Encounter: Payer: Self-pay | Admitting: Endocrinology

## 2023-04-14 ENCOUNTER — Other Ambulatory Visit: Payer: Self-pay

## 2023-04-14 ENCOUNTER — Other Ambulatory Visit: Payer: Self-pay | Admitting: Endocrinology

## 2023-04-15 ENCOUNTER — Other Ambulatory Visit (HOSPITAL_COMMUNITY): Payer: Self-pay

## 2023-04-15 MED ORDER — MOUNJARO 10 MG/0.5ML ~~LOC~~ SOAJ
10.0000 mg | SUBCUTANEOUS | 1 refills | Status: DC
  Filled 2023-04-15: qty 2, 28d supply, fill #0

## 2023-04-17 ENCOUNTER — Ambulatory Visit: Payer: Commercial Managed Care - PPO | Attending: Interventional Cardiology | Admitting: Interventional Cardiology

## 2023-04-17 ENCOUNTER — Encounter: Payer: Self-pay | Admitting: Interventional Cardiology

## 2023-04-17 ENCOUNTER — Other Ambulatory Visit (HOSPITAL_COMMUNITY): Payer: Self-pay

## 2023-04-17 VITALS — BP 102/62 | HR 76 | Ht 69.0 in | Wt 301.4 lb

## 2023-04-17 DIAGNOSIS — E782 Mixed hyperlipidemia: Secondary | ICD-10-CM | POA: Diagnosis not present

## 2023-04-17 DIAGNOSIS — I10 Essential (primary) hypertension: Secondary | ICD-10-CM | POA: Diagnosis not present

## 2023-04-17 DIAGNOSIS — E1165 Type 2 diabetes mellitus with hyperglycemia: Secondary | ICD-10-CM

## 2023-04-17 DIAGNOSIS — R072 Precordial pain: Secondary | ICD-10-CM

## 2023-04-17 DIAGNOSIS — R9431 Abnormal electrocardiogram [ECG] [EKG]: Secondary | ICD-10-CM

## 2023-04-17 MED ORDER — METOPROLOL TARTRATE 100 MG PO TABS
100.0000 mg | ORAL_TABLET | Freq: Once | ORAL | 0 refills | Status: DC
  Filled 2023-04-17: qty 1, 1d supply, fill #0

## 2023-04-17 NOTE — Progress Notes (Signed)
Cardiology Office Note   Date:  04/17/2023   ID:  Larry Rose, DOB 1966/01/26, MRN [id number]  PCP:  Octavia Heir, NP    No chief complaint on file.    Wt Readings from Last 3 Encounters:  04/17/23 (!) 301 lb 6.4 oz (136.7 kg)  02/06/23 293 lb 12.8 oz (133.3 kg)  11/14/22 (!) 300 lb 9.6 oz (136.4 kg)       History of Present Illness: Larry Rose is a 57 y.o. male who is being seen today for the evaluation of abnormal ECG at the request of [place], Amy E, NP.   Negative stress test in 2017.  Both parents have passed away within the last year.    Abnormal routine ECG.   He works at [employer] and walks a lot.  He feels generally stiff.  He has back pain.  He has had some chest pain.  Occurs at random.  Activity is limited by joint issues.   Type 2 DM or 20 years.   Past Medical History:  Diagnosis Date   ALCOHOLISM 11/30/2009   ALLERGIC RHINITIS 04/01/2007   CARPAL TUNNEL SYNDROME, RIGHT 10/06/2008   DIABETES MELLITUS, TYPE II 04/01/2007   Hepatic steatosis    HYPERLIPIDEMIA, ATHEROGENIC 09/29/2007   HYPERTENSION 04/01/2007   Rosacea 05/14/2010   TINEA CRURIS 11/07/2008    Past Surgical History:  Procedure Laterality Date   carpel tunnel     FRACTURE SURGERY       Current Outpatient Medications  Medication Sig Dispense Refill   BD PEN NEEDLE NANO 2ND GEN 32G X 4 MM MISC USE 3 TIMES PER DAY. 100 each 10   Colchicine (MITIGARE) 0.6 MG CAPS Take 1 capsule by mouth as needed.     Continuous Glucose Sensor (DEXCOM G7 SENSOR) MISC Use to check glucose continuously 9 each 2   dapagliflozin propanediol (FARXIGA) 10 MG TABS tablet Take 1 tablet (10 mg total) by mouth daily before breakfast. 30 tablet 2   furosemide (LASIX) 20 MG tablet Take 1 tablet (20 mg total) by mouth daily as needed. 30 tablet 3   Insulin Pen Needle (TECHLITE PEN NEEDLES) 32G X 4 MM MISC Use 3 (three) times daily. 100 each 10   insulin regular human CONCENTRATED (HUMULIN R U-500 KWIKPEN) 500  UNIT/ML KwikPen Inject 3 times a day into the skin (just before each meal) 150-140-150 units. 48 mL 1   lisinopril (ZESTRIL) 20 MG tablet Take 1 tablet (20 mg total) by mouth daily. 90 tablet 1   Multiple Vitamin (MULTIVITAMIN) tablet Take 1 tablet by mouth daily.     rosuvastatin (CRESTOR) 5 MG tablet Take 1 tablet (5 mg total) by mouth daily. 30 tablet 3   tirzepatide (MOUNJARO) 10 MG/0.5ML Pen Inject 10 mg into the skin once a week. 2 mL 1   triamcinolone cream (KENALOG) 0.1 % Apply 1 Application topically as needed.     No current facility-administered medications for this visit.    Allergies:   Enalapril maleate and Lovastatin    Social History:  The patient  reports that he has never smoked. He has never used smokeless tobacco. He reports that he does not currently use alcohol. He reports that he does not currently use drugs.   Family History:  The patient's family history includes Diabetes in his maternal grandmother; Heart attack in his maternal grandfather; Hypertension in his father; Pneumonia in his father; Stroke in his paternal grandfather.    ROS:  Please see  the history of present illness.   Otherwise, review of systems are positive for difficult to keep sugars down.   All other systems are reviewed and negative.    PHYSICAL EXAM: VS:  BP 102/62   Pulse 76   Ht 5\' 9"  (1.753 m)   Wt (!) 301 lb 6.4 oz (136.7 kg)   SpO2 96%   BMI 44.51 kg/m  , BMI Body mass index is 44.51 kg/m. GEN: Well nourished, well developed, in no acute distress HEENT: normal Neck: no JVD, carotid bruits, or masses Cardiac: RRR; no murmurs, rubs, or gallops,no edema  Respiratory:  clear to auscultation bilaterally, normal work of breathing GI: soft, nontender, nondistended, + BS MS: no deformity or atrophy Skin: warm and dry, no rash Neuro:  Strength and sensation are intact Psych: euthymic mood, full affect   EKG:   The ekg ordered today demonstrates NSR, LAFB, mild QRS widening, poor R  wave progression   Recent Labs: 09/30/2022: ALT 39 02/03/2023: BUN 16; Creatinine, Ser 0.91; Potassium 4.0; Sodium 141   Lipid Panel    Component Value Date/Time   CHOL 165 09/30/2022 1549   TRIG 335.0 (H) 09/30/2022 1549   HDL 34.10 (L) 09/30/2022 1549   CHOLHDL 5 09/30/2022 1549   VLDL 67.0 (H) 09/30/2022 1549   LDLCALC  02/09/2021 0000     Comment:     . LDL cholesterol not calculated. Triglyceride levels greater than 400 mg/dL invalidate calculated LDL results. . Reference range: <100 . Desirable range <100 mg/dL for primary prevention;   <70 mg/dL for patients with CHD or diabetic patients  with > or = 2 CHD risk factors. Marland Kitchen LDL-C is now calculated using the Martin-Hopkins  calculation, which is a validated novel method providing  better accuracy than the Friedewald equation in the  estimation of LDL-C.  Horald Pollen et al. Lenox Ahr. 8119;147(82): 2061-2068  (http://education.Quest[facility].com/faq/FAQ164)    LDLDIRECT 80.0 09/30/2022 1549     Other studies Reviewed: Additional studies/ records that were reviewed today with results demonstrating: February 2024 total cholesterol 165 HDL 34 LDL 80 triglycerides 335.   ASSESSMENT AND PLAN:  Chest pain: abnormal ECG. . Plan for a CT angiogram of the coronary arteries.  Plan for metoprolol 100 mg prior to the scan; hold lisinopril the day of the scan to avoid low blood pressures.  Depending on the CT results,  could consider echocardiogram in the future  Hyperlipidemia: Continue rosuvastatin.  High triglycerides in February 2024,Should improve with sugars coming down Diabetes: A1c 8.5 in June 2024. HTN: The current medical regimen is effective;  continue present plan and medications.  Continue lisinopril. Long-term, he will benefit from weight loss.  This will help his blood sugars as well.  Recommend whole food, plant-based diet.  High-fiber diet.  Avoid processed foods.    Current medicines are reviewed at length with the  patient today.  The patient concerns regarding his medicines were addressed.  The following changes have been made:  No change   Labs/ tests ordered today include:   Orders Placed This Encounter  Procedures   EKG 12-Lead    Recommend 150 minutes/week of aerobic exercise Low fat, low carb, high fiber diet recommended  Disposition:   FU in 1 year   Signed, Lance Muss, MD  04/17/2023 9:01 AM    [facility] [address] [place], [place], [place]  [address] Phone: [phone]; Fax: [phone]

## 2023-04-17 NOTE — Patient Instructions (Signed)
Medication Instructions:  Your physician recommends that you continue on your current medications as directed. Please refer to the Current Medication list given to you today.  *If you need a refill on your cardiac medications before your next appointment, please call your pharmacy*  Lab Work: TODAY: BMET If you have labs (blood work) drawn today and your tests are completely normal, you will receive your results only by: MyChart Message (if you have MyChart) OR A paper copy in the mail If you have any lab test that is abnormal or we need to change your treatment, we will call you to review the results.  Testing/Procedures: Your physician has requested that you have cardiac CT (coronary CTA). Cardiac computed tomography (CT) is a painless test that uses an x-ray machine to take clear, detailed pictures of your heart. For further information please visit [email or web address] Please follow instruction sheet as given.    Follow-Up: Will be based on results of coronary CTA.  Other Instructions   Your cardiac CT will be scheduled at:   [facility] [address] [place], [place] [address] [phone]  Please arrive at the [facility] Entrance (Entrance C2) of [facility] 30 minutes prior to test start time. You can use the FREE valet parking offered at entrance C (encouraged to control the heart rate for the test). Proceed to the [facility] Department (first floor) to check-in and test prep.  All radiology patients and guests should use entrance C2 at [facility], accessed from [facility], even though the hospital's physical address listed is [address].   Please follow these instructions carefully (unless otherwise directed):  An IV will be required for this test and Nitroglycerin will be given.  Hold all erectile dysfunction medications at least 3 days (72 hrs) prior to test. (Ie viagra, cialis, sildenafil,  tadalafil, etc)   On the Night Before the Test: Be sure to Drink plenty of water. Do not consume any caffeinated/decaffeinated beverages or chocolate 12 hours prior to your test. Do not take any antihistamines 12 hours prior to your test.  On the Day of the Test: Drink plenty of water until 1 hour prior to the test. Do not eat any food 1 hour prior to test. You may take your regular medications prior to the test.  Take metoprolol (Lopressor) 100mg  two hours prior to test. If you take Furosemide, please HOLD on the morning of the test.  After the Test: Drink plenty of water. After receiving IV contrast, you may experience a mild flushed feeling. This is normal. On occasion, you may experience a mild rash up to 24 hours after the test. This is not dangerous. If this occurs, you can take Benadryl 25 mg and increase your fluid intake. If you experience trouble breathing, this can be serious. If it is severe call 911 IMMEDIATELY. If it is mild, please call our office. If you take any of these medications: Glipizide/Metformin, Avandament, Glucavance, please do not take 48 hours after completing test unless otherwise instructed.  We will call to schedule your test 2-4 weeks out understanding that some insurance companies will need an authorization prior to the service being performed.   For more information and frequently asked questions, please visit our website : [email or web address]  For non-scheduling related questions, please contact the cardiac imaging nurse navigator should you have any questions/concerns: Cardiac Imaging Nurse Navigators Direct Office Dial: [phone]   For scheduling needs, including cancellations and rescheduling,  please call [place], [phone].

## 2023-04-18 LAB — BASIC METABOLIC PANEL
BUN/Creatinine Ratio: 21 — ABNORMAL HIGH (ref 9–20)
BUN: 16 mg/dL (ref 6–24)
CO2: 24 mmol/L (ref 20–29)
Calcium: 9.2 mg/dL (ref 8.7–10.2)
Chloride: 100 mmol/L (ref 96–106)
Creatinine, Ser: 0.78 mg/dL (ref 0.76–1.27)
Glucose: 215 mg/dL — ABNORMAL HIGH (ref 70–99)
Potassium: 4.2 mmol/L (ref 3.5–5.2)
Sodium: 140 mmol/L (ref 134–144)
eGFR: 106 mL/min/{1.73_m2} (ref >59)

## 2023-04-22 ENCOUNTER — Encounter: Payer: Self-pay | Admitting: Endocrinology

## 2023-04-22 ENCOUNTER — Other Ambulatory Visit (HOSPITAL_COMMUNITY): Payer: Self-pay

## 2023-04-22 ENCOUNTER — Ambulatory Visit: Payer: Commercial Managed Care - PPO | Admitting: Endocrinology

## 2023-04-22 VITALS — BP 130/65 | HR 80 | Ht 69.0 in | Wt 296.0 lb

## 2023-04-22 DIAGNOSIS — E1165 Type 2 diabetes mellitus with hyperglycemia: Secondary | ICD-10-CM | POA: Diagnosis not present

## 2023-04-22 DIAGNOSIS — Z794 Long term (current) use of insulin: Secondary | ICD-10-CM | POA: Diagnosis not present

## 2023-04-22 MED ORDER — GVOKE HYPOPEN 1-PACK 1 MG/0.2ML ~~LOC~~ SOAJ
1.0000 mg | SUBCUTANEOUS | 2 refills | Status: DC | PRN
  Filled 2023-04-22: qty 0.4, 4d supply, fill #0

## 2023-04-22 MED ORDER — TIRZEPATIDE 2.5 MG/0.5ML ~~LOC~~ SOAJ
2.5000 mg | SUBCUTANEOUS | 4 refills | Status: DC
  Filled 2023-04-22: qty 6, 84d supply, fill #0

## 2023-04-22 NOTE — Progress Notes (Signed)
Outpatient Endocrinology Note Larry Kavin Weckwerth, MD  04/23/23  Patient's Name: Larry Rose    DOB: [birth year]    MRN: [id number]                                                    REASON OF VISIT: Follow up for type 2 diabetes mellitus  PCP: Octavia Heir, NP  HISTORY OF PRESENT ILLNESS:   Larry Rose is a 57 y.o. old male with past medical history listed below, is here for follow up for type 2 diabetes mellitus.   Pertinent Diabetes History: Patient was diagnosed with type 2 diabetes mellitus in 1993.  He was started on insulin therapy in 2009.  His prior medication includes metformin, Farxiga and Trulicity.  He had variable control of blood sugar/diabetes with hemoglobin A1c in the range of 6.6 to 9%.  Chronic Diabetes Complications : Retinopathy: no. Last ophthalmology exam was done on annually, reportedly. Nephropathy: no, on lisinopril Peripheral neuropathy: no Coronary artery disease: no Stroke: no  Relevant comorbidities and cardiovascular risk factors: Obesity: yes Body mass index is 43.71 kg/m.  Hypertension: yes Hyperlipidemia. Yes, on statin  Current / Home Diabetic regimen includes: Humulin R U-500 insulin 150 - 130 - 140 units   with meals 3 times a day. Farxiga 10 mg daily. Mounjaro 10 mg weekly.   Prior diabetic medications: Trulicity stopped due to diarrhea.  Glycemic data:   No glucometer data to review.  Hypoglycemia: Patient has hypoglycemic episodes, reportedly, no glucometer data to review. Patient has hypoglycemia awareness.   Factors modifying glucose control: 1.  Diabetic diet assessment: 3 meals a day, frequently snacking.  2.  Staying active or exercising: No formal exercise.  Active at work.  Works for [employer].  3.  Medication compliance: compliant most of the time.  Interval history 04/23/23 No glucose data to review.  He reports he sometimes gets low up to 60s.  He has not been checking blood sugar for about a month.  He  had used Dexcom CGM in the past however not currently using it.  He reports compliance with his U-500 insulin.  No other complaints today.  REVIEW OF SYSTEMS As per history of present illness.   PAST MEDICAL HISTORY: Past Medical History:  Diagnosis Date   ALCOHOLISM 11/30/2009   ALLERGIC RHINITIS 04/01/2007   CARPAL TUNNEL SYNDROME, RIGHT 10/06/2008   DIABETES MELLITUS, TYPE II 04/01/2007   Hepatic steatosis    HYPERLIPIDEMIA, ATHEROGENIC 09/29/2007   HYPERTENSION 04/01/2007   Rosacea 05/14/2010   TINEA CRURIS 11/07/2008    PAST SURGICAL HISTORY: Past Surgical History:  Procedure Laterality Date   carpel tunnel     FRACTURE SURGERY      ALLERGIES: Allergies  Allergen Reactions   Enalapril Maleate    Lovastatin     FAMILY HISTORY:  Family History  Problem Relation Age of Onset   Hypertension Father    Pneumonia Father    Diabetes Maternal Grandmother    Heart attack Maternal Grandfather    Stroke Paternal Grandfather    Cancer Neg Hx     SOCIAL HISTORY: Social History   Socioeconomic History   Marital status: Married    Spouse name: Not on file   Number of children: Not on file   Years of education: Not on file   Highest  education level: Not on file  Occupational History   Occupation: Works Quarry manager  Tobacco Use   Smoking status: Never   Smokeless tobacco: Never  Vaping Use   Vaping status: Never Used  Substance and Sexual Activity   Alcohol use: Not Currently   Drug use: Not Currently   Sexual activity: Not on file  Other Topics Concern   Not on file  Social History Narrative   Not on file   Social Determinants of Health   Financial Resource Strain: Not on file  Food Insecurity: Not on file  Transportation Needs: Not on file  Physical Activity: Not on file  Stress: Not on file  Social Connections: Unknown (01/01/2022)   Received from [facility]   Social Network    Social Network: Not on file    MEDICATIONS:  Current  Outpatient Medications  Medication Sig Dispense Refill   BD PEN NEEDLE NANO 2ND GEN 32G X 4 MM MISC USE 3 TIMES PER DAY. 100 each 10   Colchicine (MITIGARE) 0.6 MG CAPS Take 1 capsule by mouth as needed.     dapagliflozin propanediol (FARXIGA) 10 MG TABS tablet Take 1 tablet (10 mg total) by mouth daily before breakfast. 30 tablet 2   furosemide (LASIX) 20 MG tablet Take 1 tablet (20 mg total) by mouth daily as needed. 30 tablet 3   Glucagon (GVOKE HYPOPEN 1-PACK) 1 MG/0.2ML SOAJ Inject 1 mg into the skin as needed (low blood sugar with impaired consciousness). 0.4 mL 2   Insulin Pen Needle (TECHLITE PEN NEEDLES) 32G X 4 MM MISC Use 3 (three) times daily. 100 each 10   lisinopril (ZESTRIL) 20 MG tablet Take 1 tablet (20 mg total) by mouth daily. 90 tablet 1   Multiple Vitamin (MULTIVITAMIN) tablet Take 1 tablet by mouth daily.     rosuvastatin (CRESTOR) 5 MG tablet Take 1 tablet (5 mg total) by mouth daily. 30 tablet 3   tirzepatide (MOUNJARO) 12.5 MG/0.5ML Pen Inject 12.5 mg into the skin once a week. 6 mL 4   triamcinolone cream (KENALOG) 0.1 % Apply 1 Application topically as needed.     Continuous Glucose Sensor (DEXCOM G7 SENSOR) MISC Use to check glucose continuously (Patient not taking: Reported on 04/22/2023) 9 each 2   insulin regular human CONCENTRATED (HUMULIN R U-500 KWIKPEN) 500 UNIT/ML KwikPen Inject 3 times a day into the skin (just before each meal) 150-130-140 units. 48 mL 1   metoprolol tartrate (LOPRESSOR) 100 MG tablet Take 1 tablet (100 mg total) by mouth once for 1 dose. Take 90-120 minutes prior to scan. Hold for SBP less than 110. 1 tablet 0   No current facility-administered medications for this visit.    PHYSICAL EXAM: Vitals:   04/22/23 1423  BP: 130/65  Pulse: 80  SpO2: 95%  Weight: 296 lb (134.3 kg)  Height: 5\' 9"  (1.753 m)   Body mass index is 43.71 kg/m.  Wt Readings from Last 3 Encounters:  04/22/23 296 lb (134.3 kg)  04/17/23 (!) 301 lb 6.4 oz (136.7 kg)   02/06/23 293 lb 12.8 oz (133.3 kg)    General: Well developed, well nourished male in no apparent distress.  HEENT: AT/[place], no external lesions.  Eyes: Conjunctiva clear and no icterus. Neck: Neck supple  Lungs: Respirations not labored Neurologic: Alert, oriented, normal speech Extremities / Skin: Dry. No sores or rashes noted.  Psychiatric: Does not appear depressed or anxious  Diabetic Foot Exam - Simple   No data filed  LABS Reviewed Lab Results  Component Value Date   HGBA1C 8.5 (H) 02/03/2023   HGBA1C 8.8 (H) 09/30/2022   HGBA1C 7.4 (H) 05/22/2022   No results found for: "FRUCTOSAMINE" Lab Results  Component Value Date   CHOL 165 09/30/2022   HDL 34.10 (L) 09/30/2022   LDLCALC  02/09/2021     Comment:     . LDL cholesterol not calculated. Triglyceride levels greater than 400 mg/dL invalidate calculated LDL results. . Reference range: <100 . Desirable range <100 mg/dL for primary prevention;   <70 mg/dL for patients with CHD or diabetic patients  with > or = 2 CHD risk factors. Marland Kitchen LDL-C is now calculated using the Martin-Hopkins  calculation, which is a validated novel method providing  better accuracy than the Friedewald equation in the  estimation of LDL-C.  Larry Rose et al. Lenox Ahr. 1610;960(45): 2061-2068  (http://education.[facility].com/faq/FAQ164)    LDLDIRECT 80.0 09/30/2022   TRIG 335.0 (H) 09/30/2022   CHOLHDL 5 09/30/2022   Lab Results  Component Value Date   MICRALBCREAT 0.9 05/22/2022   MICRALBCREAT 1.1 06/02/2018   Lab Results  Component Value Date   CREATININE 0.78 04/17/2023   Lab Results  Component Value Date   GFR 95.56 02/03/2023    ASSESSMENT / PLAN  1. Uncontrolled type 2 diabetes mellitus with hyperglycemia, with long-term current use of insulin (HCC)     Diabetes Mellitus type 2, complicated by no known complications. - Diabetic status / severity: Uncontrolled  Lab Results  Component Value Date   HGBA1C 8.5  (H) 02/03/2023    - Hemoglobin A1c goal : <7%  Discussed about type 2 diabetes mellitus and importance of blood sugar control and to prevent chronic diabetic complications.  Discussed about compliance with medications.  Discussed about importance of blood sugar monitoring and checking.  Patient is advised to use Dexcom CGM he has at home.  - Medications: See below  I) increase Mounjaro from 10 to 12.5 mg weekly. II) continue Farxiga 10 mg daily. III) continue Humulin R U-500 insulin 150 - 130 - 140 units with meals 3 times a day.  - Home glucose testing: 3-4 times a day.  He has Dexcom G7 advised to use it. - Discussed/ Gave Hypoglycemia treatment plan.  Glucagon Emergency Kit prescribed.  Advised to use glucose tablets for correction of hypoglycemia.  Discussed that patient is on high-dose of concentrated insulin and has risks of having hypoglycemia.  # Consult : not required at this time.   # Annual urine for microalbuminuria/ creatinine ratio, no microalbuminuria currently, continue ACE/ARB/lisinopril. Last  Lab Results  Component Value Date   MICRALBCREAT 0.9 05/22/2022    # Foot check nightly.  # Annual dilated diabetic eye exams.   - Diet: Make healthy diabetic food choices - Life style / activity / exercise: Discussed.  2. Blood pressure  -  BP Readings from Last 1 Encounters:  04/22/23 130/65    - Control is in target.  - No change in current plans.  3. Lipid status / Hyperlipidemia - Last  Lab Results  Component Value Date   [facility]  02/09/2021     Comment:     . LDL cholesterol not calculated. Triglyceride levels greater than 400 mg/dL invalidate calculated LDL results. . Reference range: <100 . Desirable range <100 mg/dL for primary prevention;   <70 mg/dL for patients with CHD or diabetic patients  with > or = 2 CHD risk factors. Marland Kitchen LDL-C is now calculated using the Martin-Hopkins  calculation, which is a validated novel method providing  better  accuracy than the Friedewald equation in the  estimation of LDL-C.  Larry Rose et al. Lenox Ahr. 4098;119(14): 2061-2068  (http://education.[facility].com/faq/FAQ164)    - Continue rosuvastatin 5 mg daily.  Diagnoses and all orders for this visit:  Uncontrolled type 2 diabetes mellitus with hyperglycemia, with long-term current use of insulin (HCC) -     insulin regular human CONCENTRATED (HUMULIN R U-500 KWIKPEN) 500 UNIT/ML KwikPen; Inject 3 times a day into the skin (just before each meal) 150-130-140 units.  Other orders -     Glucagon (GVOKE HYPOPEN 1-PACK) 1 MG/0.2ML SOAJ; Inject 1 mg into the skin as needed (low blood sugar with impaired consciousness). -     Discontinue: tirzepatide (MOUNJARO) 2.5 MG/0.5ML Pen; Inject 2.5 mg into the skin once a week. -     tirzepatide (MOUNJARO) 12.5 MG/0.5ML Pen; Inject 12.5 mg into the skin once a week.    DISPOSITION Follow up in clinic in 6 weeks suggested.   All questions answered and patient verbalized understanding of the plan.  Larry Lamir Racca, MD [facility] Endocrinology [facility] [address] [place], [address] [place], [place] [address] Phone # [phone]  At least part of this note was generated using voice recognition software. Inadvertent word errors may have occurred, which were not recognized during the proofreading process.

## 2023-04-22 NOTE — Patient Instructions (Addendum)
Increase mounjaro 12.5 mg weekly.  Continue current dose of farxiga 10 mg daily.  U500 : 150-130-140 units  Use DEXCOM G7   [place] glucose tablet for hypoglycemia / low sugar.  Sent glucagon emergency kit.

## 2023-04-23 ENCOUNTER — Other Ambulatory Visit (HOSPITAL_COMMUNITY): Payer: Self-pay

## 2023-04-23 MED ORDER — TIRZEPATIDE 12.5 MG/0.5ML ~~LOC~~ SOAJ
12.5000 mg | SUBCUTANEOUS | 4 refills | Status: DC
Start: 2023-04-23 — End: 2023-06-12
  Filled 2023-04-23 – 2023-05-04 (×2): qty 6, 84d supply, fill #0
  Filled 2023-05-08: qty 2, 28d supply, fill #0
  Filled 2023-05-31: qty 2, 28d supply, fill #1

## 2023-04-23 MED ORDER — HUMULIN R U-500 KWIKPEN 500 UNIT/ML ~~LOC~~ SOPN
PEN_INJECTOR | SUBCUTANEOUS | 1 refills | Status: DC
Start: 2023-04-23 — End: 2023-08-24
  Filled 2023-04-23: qty 48, 57d supply, fill #0
  Filled 2023-05-04: qty 24, 28d supply, fill #0
  Filled 2023-05-08: qty 48, 57d supply, fill #0
  Filled 2023-06-29: qty 48, 57d supply, fill #1

## 2023-04-25 ENCOUNTER — Other Ambulatory Visit (HOSPITAL_COMMUNITY): Payer: Self-pay

## 2023-04-28 ENCOUNTER — Other Ambulatory Visit: Payer: Self-pay

## 2023-04-29 ENCOUNTER — Encounter (HOSPITAL_COMMUNITY): Payer: Self-pay

## 2023-04-30 ENCOUNTER — Telehealth (HOSPITAL_COMMUNITY): Payer: Self-pay | Admitting: *Deleted

## 2023-04-30 NOTE — Telephone Encounter (Signed)
Reaching out to patient to offer assistance regarding upcoming cardiac imaging study; pt verbalizes understanding of appt date/time, parking situation and where to check in, pre-test NPO status and medications ordered, and verified current allergies; name and call back number provided for further questions should they arise Hayley Sharpe RN Navigator Cardiac Imaging [facility] [phone] office [phone] cell  

## 2023-05-01 ENCOUNTER — Ambulatory Visit (HOSPITAL_COMMUNITY)
Admission: RE | Admit: 2023-05-01 | Discharge: 2023-05-01 | Disposition: A | Discharge status: Home / Self Care | Payer: Commercial Managed Care - PPO | Source: Ambulatory Visit | Attending: Interventional Cardiology | Admitting: Interventional Cardiology

## 2023-05-01 DIAGNOSIS — R072 Precordial pain: Secondary | ICD-10-CM | POA: Diagnosis present

## 2023-05-01 DIAGNOSIS — I2510 Atherosclerotic heart disease of native coronary artery without angina pectoris: Secondary | ICD-10-CM

## 2023-05-01 DIAGNOSIS — R9431 Abnormal electrocardiogram [ECG] [EKG]: Secondary | ICD-10-CM | POA: Diagnosis present

## 2023-05-01 MED ORDER — IOHEXOL 350 MG/ML SOLN
115.0000 mL | Freq: Once | INTRAVENOUS | Status: AC | PRN
  Administered 2023-05-01: 115 mL via INTRAVENOUS

## 2023-05-01 MED ORDER — NITROGLYCERIN 0.4 MG SL SUBL
0.8000 mg | SUBLINGUAL_TABLET | Freq: Once | SUBLINGUAL | Status: AC
  Administered 2023-05-01: 0.8 mg via SUBLINGUAL

## 2023-05-01 MED ORDER — NITROGLYCERIN 0.4 MG SL SUBL
SUBLINGUAL_TABLET | SUBLINGUAL | Status: AC
  Filled 2023-05-01: qty 2

## 2023-05-02 ENCOUNTER — Other Ambulatory Visit (HOSPITAL_COMMUNITY): Payer: Self-pay | Admitting: Emergency Medicine

## 2023-05-02 ENCOUNTER — Ambulatory Visit (HOSPITAL_COMMUNITY)
Admission: RE | Admit: 2023-05-02 | Discharge: 2023-05-02 | Disposition: A | Discharge status: Home / Self Care | Payer: Self-pay | Source: Ambulatory Visit | Attending: Cardiology | Admitting: Cardiology

## 2023-05-02 DIAGNOSIS — R931 Abnormal findings on diagnostic imaging of heart and coronary circulation: Secondary | ICD-10-CM | POA: Diagnosis not present

## 2023-05-04 ENCOUNTER — Encounter: Payer: Self-pay | Admitting: Interventional Cardiology

## 2023-05-04 DIAGNOSIS — E782 Mixed hyperlipidemia: Secondary | ICD-10-CM

## 2023-05-05 ENCOUNTER — Other Ambulatory Visit (HOSPITAL_COMMUNITY): Payer: Self-pay

## 2023-05-05 ENCOUNTER — Other Ambulatory Visit: Payer: Self-pay

## 2023-05-05 NOTE — Telephone Encounter (Signed)
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  He needs aggressive medical therapy. I think f/u with Dr. Cristal Deer or Dr. Duke Salvia, Dr. Flora Lipps or Dr. Anne Fu would be good for prevention and risk factor modification.

## 2023-05-07 ENCOUNTER — Other Ambulatory Visit (HOSPITAL_COMMUNITY): Payer: Self-pay

## 2023-05-07 MED ORDER — ROSUVASTATIN CALCIUM 10 MG PO TABS
10.0000 mg | ORAL_TABLET | Freq: Every day | ORAL | 3 refills | Status: DC
  Filled 2023-05-07: qty 90, 90d supply, fill #0
  Filled 2023-08-01: qty 90, 90d supply, fill #1
  Filled 2023-10-31: qty 90, 90d supply, fill #2
  Filled 2024-01-29: qty 90, 90d supply, fill #3

## 2023-05-07 NOTE — Telephone Encounter (Signed)
Corky Crafts, MD     WOuld increase rosuvastatin to 10 mg daily and repeat liver and lipids in 3 months.  He could f/u in 6 months at [facility]

## 2023-05-08 ENCOUNTER — Other Ambulatory Visit (HOSPITAL_COMMUNITY): Payer: Self-pay

## 2023-05-14 ENCOUNTER — Other Ambulatory Visit: Payer: Self-pay | Admitting: Adult Health

## 2023-05-14 ENCOUNTER — Other Ambulatory Visit (HOSPITAL_COMMUNITY): Payer: Self-pay

## 2023-05-14 ENCOUNTER — Other Ambulatory Visit: Payer: Self-pay | Admitting: Orthopedic Surgery

## 2023-05-14 DIAGNOSIS — I10 Essential (primary) hypertension: Secondary | ICD-10-CM

## 2023-05-14 MED ORDER — FUROSEMIDE 20 MG PO TABS
20.0000 mg | ORAL_TABLET | Freq: Every day | ORAL | 3 refills | Status: DC | PRN
  Filled 2023-05-14 – 2023-05-31 (×2): qty 30, 30d supply, fill #0
  Filled 2023-06-29: qty 30, 30d supply, fill #1
  Filled 2023-08-05: qty 30, 30d supply, fill #2
  Filled 2023-09-05: qty 30, 30d supply, fill #3

## 2023-05-14 MED ORDER — LISINOPRIL 20 MG PO TABS
20.0000 mg | ORAL_TABLET | Freq: Every day | ORAL | 1 refills | Status: DC
  Filled 2023-05-14 – 2023-08-05 (×2): qty 90, 90d supply, fill #0
  Filled 2023-08-15 – 2023-10-31 (×2): qty 90, 90d supply, fill #1

## 2023-05-14 NOTE — Telephone Encounter (Signed)
Patient medication has allergy contraindications. Medication pend and sent to PCP Octavia Heir, NP

## 2023-05-15 ENCOUNTER — Ambulatory Visit: Payer: Commercial Managed Care - PPO | Admitting: Orthopedic Surgery

## 2023-05-15 ENCOUNTER — Encounter: Payer: Self-pay | Admitting: Orthopedic Surgery

## 2023-05-15 VITALS — BP 138/78 | HR 79 | Temp 97.4°F | Resp 18 | Ht 69.0 in | Wt 302.8 lb

## 2023-05-15 DIAGNOSIS — E1165 Type 2 diabetes mellitus with hyperglycemia: Secondary | ICD-10-CM | POA: Diagnosis not present

## 2023-05-15 DIAGNOSIS — Z6841 Body Mass Index (BMI) 40.0 and over, adult: Secondary | ICD-10-CM | POA: Diagnosis not present

## 2023-05-15 DIAGNOSIS — I2510 Atherosclerotic heart disease of native coronary artery without angina pectoris: Secondary | ICD-10-CM | POA: Diagnosis not present

## 2023-05-15 DIAGNOSIS — I10 Essential (primary) hypertension: Secondary | ICD-10-CM

## 2023-05-15 DIAGNOSIS — Z1159 Encounter for screening for other viral diseases: Secondary | ICD-10-CM | POA: Diagnosis not present

## 2023-05-15 NOTE — Patient Instructions (Addendum)
Take statin at night> if you continue to have aching> stop medication and  please notify cardiology  Schedule lab visit in December to recheck cholesterol and other labs

## 2023-05-15 NOTE — Progress Notes (Signed)
Careteam: Patient Care Team: Octavia Heir, NP as PCP - General (Adult Health Nurse Practitioner) Gelene Mink, OD as Referring Physician (Optometry)  Seen by: Hazle Nordmann, AGNP-C  PLACE OF SERVICE:  [facility]  Advanced Directive information    Allergies  Allergen Reactions   Enalapril Maleate    Lovastatin     Chief Complaint  Patient presents with   Medical Management of Chronic Issues    6 month follow up.    Health Maintenance    Discuss the need for Urine microalbumin, and Hepatitis C Screening.    Immunizations    Discuss the need for Shingrix vaccine, and Covid Booster. NCIR Verified.      HPI: Patient is a 57 y.o. male seen today for medical management of chronic conditions.   Followed by [facility] endocrinology for T2DM. Now taking Farixga and Monjuaro. Checking sugars often. No hypoglycemias.    09/13 CT coronary indicated moderate CAD. Rosuvastatin was just increased, reports some muscle aches. He also has a very physical job with [employer] and unsure if aches from job. Denies tea colored urine. Also taking medication in AM. Trying to eat more vegetables and lean meats.  He plans to get flu vaccine next month with employer.   Continues to grieve mothers passing. Denies depression or anxiety.    Review of Systems:  Review of Systems  Constitutional: Negative.   HENT: Negative.    Eyes: Negative.   Respiratory:  Negative for cough, shortness of breath and wheezing.   Cardiovascular:  Negative for chest pain and leg swelling.  Gastrointestinal: Negative.   Genitourinary: Negative.   Musculoskeletal:  Positive for joint pain and myalgias. Negative for falls.  Skin: Negative.   Neurological: Negative.   Endo/Heme/Allergies:  Negative for polydipsia.  Psychiatric/Behavioral: Negative.      Past Medical History:  Diagnosis Date   ALCOHOLISM 11/30/2009   ALLERGIC RHINITIS 04/01/2007   CARPAL TUNNEL SYNDROME, RIGHT 10/06/2008   DIABETES MELLITUS, TYPE  II 04/01/2007   Hepatic steatosis    HYPERLIPIDEMIA, ATHEROGENIC 09/29/2007   HYPERTENSION 04/01/2007   Rosacea 05/14/2010   TINEA CRURIS 11/07/2008   Past Surgical History:  Procedure Laterality Date   carpel tunnel     FRACTURE SURGERY     Social History:   reports that he has never smoked. He has never used smokeless tobacco. He reports that he does not currently use alcohol. He reports that he does not currently use drugs.  Family History  Problem Relation Age of Onset   Hypertension Father    Pneumonia Father    Diabetes Maternal Grandmother    Heart attack Maternal Grandfather    Stroke Paternal Grandfather    Cancer Neg Hx     Medications: Patient's Medications  New Prescriptions   No medications on file  Previous Medications   BD PEN NEEDLE NANO 2ND GEN 32G X 4 MM MISC    USE 3 TIMES PER DAY.   COLCHICINE (MITIGARE) 0.6 MG CAPS    Take 1 capsule by mouth as needed.   CONTINUOUS GLUCOSE SENSOR (DEXCOM G7 SENSOR) MISC    Use to check glucose continuously   DAPAGLIFLOZIN PROPANEDIOL (FARXIGA) 10 MG TABS TABLET    Take 1 tablet (10 mg total) by mouth daily before breakfast.   FUROSEMIDE (LASIX) 20 MG TABLET    Take 1 tablet (20 mg total) by mouth daily as needed.   GLUCAGON (GVOKE HYPOPEN 1-PACK) 1 MG/0.2ML SOAJ    Inject 1 mg  into the skin as needed (low blood sugar with impaired consciousness).   INSULIN PEN NEEDLE (TECHLITE PEN NEEDLES) 32G X 4 MM MISC    Use 3 (three) times daily.   INSULIN REGULAR HUMAN CONCENTRATED (HUMULIN R U-500 KWIKPEN) 500 UNIT/ML KWIKPEN    Inject 150 Units into the skin before morning meal, then inject 130 Units daily before lunch AND 140 Units before evening meal   LISINOPRIL (ZESTRIL) 20 MG TABLET    Take 1 tablet (20 mg total) by mouth daily.   METOPROLOL TARTRATE (LOPRESSOR) 100 MG TABLET    Take 1 tablet (100 mg total) by mouth once for 1 dose. Take 90-120 minutes prior to scan. Hold for SBP less than 110.   MULTIPLE VITAMIN (MULTIVITAMIN)  TABLET    Take 1 tablet by mouth daily.   ROSUVASTATIN (CRESTOR) 10 MG TABLET    Take 1 tablet (10 mg total) by mouth daily.   TIRZEPATIDE (MOUNJARO) 12.5 MG/0.5ML PEN    Inject 12.5 mg into the skin once a week.   TRIAMCINOLONE CREAM (KENALOG) 0.1 %    Apply 1 Application topically as needed.  Modified Medications   No medications on file  Discontinued Medications   No medications on file    Physical Exam:  There were no vitals filed for this visit. There is no height or weight on file to calculate BMI. Wt Readings from Last 3 Encounters:  04/22/23 296 lb (134.3 kg)  04/17/23 (!) 301 lb 6.4 oz (136.7 kg)  02/06/23 293 lb 12.8 oz (133.3 kg)    Physical Exam Vitals reviewed.  Constitutional:      General: He is not in acute distress.    Appearance: He is obese.  HENT:     Head: Normocephalic.  Eyes:     General:        Right eye: No discharge.        Left eye: No discharge.  Cardiovascular:     Rate and Rhythm: Normal rate and regular rhythm.     Pulses: Normal pulses.     Heart sounds: Normal heart sounds.  Pulmonary:     Effort: No respiratory distress.     Breath sounds: Normal breath sounds. No wheezing.  Abdominal:     General: Bowel sounds are normal.     Palpations: Abdomen is soft.  Musculoskeletal:     Cervical back: Neck supple.     Right lower leg: No edema.     Left lower leg: No edema.  Skin:    General: Skin is warm.     Capillary Refill: Capillary refill takes less than 2 seconds.  Neurological:     General: No focal deficit present.     Mental Status: He is alert and oriented to person, place, and time.  Psychiatric:        Mood and Affect: Mood normal.     Labs reviewed: Basic Metabolic Panel: Recent Labs    09/30/22 1549 02/03/23 1620 04/17/23 0936  NA 139 141 140  K 4.6 4.0 4.2  CL 100 102 100  CO2 30 27 24   GLUCOSE 137* 111* 215*  BUN 15 16 16   CREATININE 1.03 0.91 0.78  CALCIUM 10.5 9.7 9.2   Liver Function Tests: Recent  Labs    05/22/22 1550 09/30/22 1549  AST 31 33  ALT 32 39  ALKPHOS 55 52  BILITOT 0.6 0.7  PROT 7.4 7.2  ALBUMIN 4.3 4.3   No results for input(s): "LIPASE", "AMYLASE" in  the last 8760 hours. No results for input(s): "AMMONIA" in the last 8760 hours. CBC: No results for input(s): "WBC", "NEUTROABS", "HGB", "HCT", "MCV", "PLT" in the last 8760 hours. Lipid Panel: Recent Labs    05/22/22 1550 09/30/22 1549  CHOL 244* 165  HDL 42.20 34.10*  TRIG 401.0 Triglyceride is over 400; calculations on Lipids are invalid.* 335.0*  CHOLHDL 6 5  LDLDIRECT 123.0 80.0   TSH: No results for input(s): "TSH" in the last 8760 hours. A1C: Lab Results  Component Value Date   HGBA1C 8.5 (H) 02/03/2023     Assessment/Plan 1. Type 2 diabetes mellitus with hyperglycemia, unspecified whether long term insulin use (HCC) - A1c 8.5 02/03/2023 - followed by endocrinology - no hypoglycemias - cont farixga and monjuaro - Microalbumin/Creatinine Ratio, Urine  2. Morbid obesity with BMI of 45.0-49.9, adult (HCC) - BMI 44.72 - on Monjuaro> recently increased - cont diet that limits calories  - TSH  3. Essential hypertension - controlled, goal < 140/80 - cont lisinopril  4. Need for hepatitis C screening test - Hepatitis C Antibody  5. Coronary artery disease without angina pectoris, unspecified vessel or lesion type, unspecified whether native or transplanted heart - followed by cardiology - CT coronary revealed moderate coronary disease - rosuvastatin recently increased> unsure if muscle aches from medication> if no improvement, stop medication and contact Cardiology  Total time: 32 minutes. Greater than 50% of total time spent doing patient education regarding health maintenance, T2DM, HTN, HLD, CAD, and obesity including symptom/medication management.    Next appt: none Vermelle Cammarata Scherry Ran  [facility] [phone]

## 2023-05-16 ENCOUNTER — Other Ambulatory Visit (HOSPITAL_COMMUNITY): Payer: Self-pay

## 2023-05-16 LAB — TSH: TSH: 3.52 m[IU]/L (ref 0.40–4.50)

## 2023-05-16 LAB — MICROALBUMIN / CREATININE URINE RATIO
Creatinine, Urine: 15 mg/dL — ABNORMAL LOW (ref 20–320)
Microalb Creat Ratio: 13 mg/g{creat} (ref <30)
Microalb, Ur: 0.2 mg/dL

## 2023-05-16 LAB — HEPATITIS C ANTIBODY: Hepatitis C Ab: NONREACTIVE

## 2023-05-21 NOTE — Telephone Encounter (Signed)
I spoke with patient and scheduled fasting lab work for 08/07/23. Appointment made for patient to see Dr Anne Fu on November 06, 2023 for 6 month follow up

## 2023-06-02 ENCOUNTER — Other Ambulatory Visit (HOSPITAL_COMMUNITY): Payer: Self-pay

## 2023-06-03 ENCOUNTER — Other Ambulatory Visit (HOSPITAL_COMMUNITY): Payer: Self-pay

## 2023-06-05 ENCOUNTER — Other Ambulatory Visit: Payer: Self-pay

## 2023-06-06 ENCOUNTER — Other Ambulatory Visit (HOSPITAL_COMMUNITY): Payer: Self-pay

## 2023-06-06 ENCOUNTER — Other Ambulatory Visit: Payer: Self-pay

## 2023-06-06 DIAGNOSIS — E1165 Type 2 diabetes mellitus with hyperglycemia: Secondary | ICD-10-CM

## 2023-06-06 MED ORDER — DAPAGLIFLOZIN PROPANEDIOL 10 MG PO TABS
10.0000 mg | ORAL_TABLET | Freq: Every day | ORAL | 2 refills | Status: DC
  Filled 2023-06-06: qty 30, 30d supply, fill #0
  Filled 2023-07-03: qty 30, 30d supply, fill #1
  Filled 2023-08-01: qty 30, 30d supply, fill #2

## 2023-06-12 ENCOUNTER — Ambulatory Visit: Payer: Commercial Managed Care - PPO | Admitting: Orthopedic Surgery

## 2023-06-12 ENCOUNTER — Ambulatory Visit: Payer: Commercial Managed Care - PPO | Admitting: Endocrinology

## 2023-06-12 ENCOUNTER — Other Ambulatory Visit (HOSPITAL_COMMUNITY): Payer: Self-pay

## 2023-06-12 ENCOUNTER — Encounter: Payer: Self-pay | Admitting: Endocrinology

## 2023-06-12 VITALS — BP 132/60 | HR 69 | Resp 20 | Ht 69.0 in | Wt 296.6 lb

## 2023-06-12 DIAGNOSIS — Z794 Long term (current) use of insulin: Secondary | ICD-10-CM | POA: Diagnosis not present

## 2023-06-12 DIAGNOSIS — E1165 Type 2 diabetes mellitus with hyperglycemia: Secondary | ICD-10-CM

## 2023-06-12 LAB — POCT GLYCOSYLATED HEMOGLOBIN (HGB A1C): Hemoglobin A1C: 8.5 % — AB (ref 4.0–5.6)

## 2023-06-12 MED ORDER — TIRZEPATIDE 15 MG/0.5ML ~~LOC~~ SOAJ
15.0000 mg | SUBCUTANEOUS | 3 refills | Status: DC
Start: 2023-06-12 — End: 2024-06-08
  Filled 2023-06-12 – 2023-06-28 (×3): qty 6, 84d supply, fill #0
  Filled 2023-06-29: qty 2, 28d supply, fill #0
  Filled 2023-07-26: qty 2, 28d supply, fill #1
  Filled 2023-08-24: qty 2, 28d supply, fill #2
  Filled 2023-09-24: qty 2, 28d supply, fill #3
  Filled 2023-10-23 – 2023-10-24 (×3): qty 2, 28d supply, fill #4
  Filled 2023-11-20: qty 2, 28d supply, fill #5
  Filled 2023-12-19: qty 2, 28d supply, fill #6
  Filled 2024-01-14: qty 2, 28d supply, fill #7
  Filled 2024-02-11: qty 2, 28d supply, fill #8
  Filled 2024-03-03 – 2024-03-15 (×2): qty 2, 28d supply, fill #9
  Filled 2024-03-30 – 2024-04-07 (×2): qty 2, 28d supply, fill #10
  Filled 2024-05-06: qty 2, 28d supply, fill #11

## 2023-06-12 NOTE — Progress Notes (Signed)
Outpatient Endocrinology Note Larry Shandy Vi, MD  06/12/23  Patient's Name: Larry Rose    DOB: [birth year]    MRN: [id number]                                                    REASON OF VISIT: Follow up for type 2 diabetes mellitus  PCP: Octavia Heir, NP  HISTORY OF PRESENT ILLNESS:   Larry Rose is a 57 y.o. old male with past medical history listed below, is here for follow up for type 2 diabetes mellitus.   Pertinent Diabetes History: Patient was diagnosed with type 2 diabetes mellitus in 1993.  He was started on insulin therapy in 2009.  His prior medication includes metformin, Farxiga and Trulicity.  He had variable control of blood sugar/diabetes with hemoglobin A1c in the range of 6.6 to 9%.  Chronic Diabetes Complications : Retinopathy: no. Last ophthalmology exam was done on annually, reportedly. Nephropathy: no, on lisinopril Peripheral neuropathy: no Coronary artery disease: no Stroke: no  Relevant comorbidities and cardiovascular risk factors: Obesity: yes Body mass index is 43.8 kg/m.  Hypertension: yes Hyperlipidemia. Yes, on statin  Current / Home Diabetic regimen includes: Humulin R U-500 insulin 150 - 130 - 140 units   with meals 3 times a day. Farxiga 10 mg daily. Mounjaro 12.5 mg weekly.   Prior diabetic medications: Trulicity stopped due to diarrhea.  Glycemic data:   No glucometer data to review.  Hypoglycemia: Patient has hypoglycemic episodes, reportedly, no glucometer data to review. Patient has hypoglycemia awareness.   Factors modifying glucose control: 1.  Diabetic diet assessment: 3 meals a day, frequently snacking.  2.  Staying active or exercising: No formal exercise.  Active at work.  Works for [employer].  3.  Medication compliance: compliant most of the time.  Interval history 06/12/23 He has not been using Dexcom however he has it at home.  He forgot to bring the glucometer in the clinic today.  He denies  hypoglycemia.  He denies GI upset or nausea, vomiting with Mounjaro.  He reports compliance with insulin regimen.  REVIEW OF SYSTEMS As per history of present illness.   PAST MEDICAL HISTORY: Past Medical History:  Diagnosis Date   ALCOHOLISM 11/30/2009   ALLERGIC RHINITIS 04/01/2007   CARPAL TUNNEL SYNDROME, RIGHT 10/06/2008   DIABETES MELLITUS, TYPE II 04/01/2007   Hepatic steatosis    HYPERLIPIDEMIA, ATHEROGENIC 09/29/2007   HYPERTENSION 04/01/2007   Rosacea 05/14/2010   TINEA CRURIS 11/07/2008    PAST SURGICAL HISTORY: Past Surgical History:  Procedure Laterality Date   carpel tunnel     FRACTURE SURGERY      ALLERGIES: Allergies  Allergen Reactions   Enalapril Maleate    Lovastatin     FAMILY HISTORY:  Family History  Problem Relation Age of Onset   Hypertension Father    Pneumonia Father    Diabetes Maternal Grandmother    Heart attack Maternal Grandfather    Stroke Paternal Grandfather    Cancer Neg Hx     SOCIAL HISTORY: Social History   Socioeconomic History   Marital status: Married    Spouse name: Not on file   Number of children: Not on file   Years of education: Not on file   Highest education level: Not on file  Occupational History  Occupation: Works Quarry manager  Tobacco Use   Smoking status: Never   Smokeless tobacco: Never  Vaping Use   Vaping status: Never Used  Substance and Sexual Activity   Alcohol use: Yes    Alcohol/week: 4.0 - 5.0 standard drinks of alcohol    Types: 4 - 5 Standard drinks or equivalent per week   Drug use: Not Currently   Sexual activity: Not on file  Other Topics Concern   Not on file  Social History Narrative   Not on file   Social Determinants of Health   Financial Resource Strain: Patient Declined (05/13/2023)   Overall Financial Resource Strain (CARDIA)    Difficulty of Paying Living Expenses: Patient declined  Food Insecurity: Patient Declined (05/13/2023)   Hunger Vital Sign    Worried  About Running Out of Food in the Last Year: Patient declined    Ran Out of Food in the Last Year: Patient declined  Transportation Needs: Patient Declined (05/13/2023)   PRAPARE - Administrator, Civil Service (Medical): Patient declined    Lack of Transportation (Non-Medical): Patient declined  Physical Activity: Not on file  Stress: Not on file  Social Connections: Unknown (05/13/2023)   Social Connection and Isolation Panel [NHANES]    Frequency of Communication with Friends and Family: Patient declined    Frequency of Social Gatherings with Friends and Family: Patient declined    Attends Religious Services: Patient declined    Database administrator or Organizations: Patient declined    Attends Engineer, structural: Not on file    Marital Status: Patient declined    MEDICATIONS:  Current Outpatient Medications  Medication Sig Dispense Refill   tirzepatide (MOUNJARO) 15 MG/0.5ML Pen Inject 15 mg into the skin once a week. 6 mL 3   BD PEN NEEDLE NANO 2ND GEN 32G X 4 MM MISC USE 3 TIMES PER DAY. 100 each 10   Colchicine (MITIGARE) 0.6 MG CAPS Take 1 capsule by mouth as needed.     Continuous Glucose Sensor (DEXCOM G7 SENSOR) MISC Use to check glucose continuously 9 each 2   dapagliflozin propanediol (FARXIGA) 10 MG TABS tablet Take 1 tablet (10 mg total) by mouth daily before breakfast. 30 tablet 2   furosemide (LASIX) 20 MG tablet Take 1 tablet (20 mg total) by mouth daily as needed. 30 tablet 3   Glucagon (GVOKE HYPOPEN 1-PACK) 1 MG/0.2ML SOAJ Inject 1 mg into the skin as needed (low blood sugar with impaired consciousness). 0.4 mL 2   Insulin Pen Needle (TECHLITE PEN NEEDLES) 32G X 4 MM MISC Use 3 (three) times daily. 100 each 10   insulin regular human CONCENTRATED (HUMULIN R U-500 KWIKPEN) 500 UNIT/ML KwikPen Inject 150 Units into the skin before morning meal, then inject 130 Units daily before lunch AND 140 Units before evening meal 48 mL 1   lisinopril  (ZESTRIL) 20 MG tablet Take 1 tablet (20 mg total) by mouth daily. 90 tablet 1   Multiple Vitamin (MULTIVITAMIN) tablet Take 1 tablet by mouth daily.     rosuvastatin (CRESTOR) 10 MG tablet Take 1 tablet (10 mg total) by mouth daily. 90 tablet 3   triamcinolone cream (KENALOG) 0.1 % Apply 1 Application topically as needed.     No current facility-administered medications for this visit.    PHYSICAL EXAM: Vitals:   06/12/23 1432  BP: 132/60  Pulse: 69  Resp: 20  SpO2: 96%  Weight: 296 lb 9.6 oz (134.5 kg)  Height: 5\' 9"  (1.753 m)   Body mass index is 43.8 kg/m.  Wt Readings from Last 3 Encounters:  06/12/23 296 lb 9.6 oz (134.5 kg)  05/15/23 (!) 302 lb 12.8 oz (137.3 kg)  04/22/23 296 lb (134.3 kg)    General: Well developed, well nourished male in no apparent distress.  HEENT: AT/[place], no external lesions.  Eyes: Conjunctiva clear and no icterus. Neck: Neck supple  Lungs: Respirations not labored Neurologic: Alert, oriented, normal speech Extremities / Skin: Dry. No sores or rashes noted.  Psychiatric: Does not appear depressed or anxious  Diabetic Foot Exam - Simple   No data filed    LABS Reviewed Lab Results  Component Value Date   HGBA1C 8.5 (A) 06/12/2023   HGBA1C 8.5 (H) 02/03/2023   HGBA1C 8.8 (H) 09/30/2022   No results found for: "FRUCTOSAMINE" Lab Results  Component Value Date   CHOL 165 09/30/2022   HDL 34.10 (L) 09/30/2022   LDLCALC  02/09/2021     Comment:     . LDL cholesterol not calculated. Triglyceride levels greater than 400 mg/dL invalidate calculated LDL results. . Reference range: <100 . Desirable range <100 mg/dL for primary prevention;   <70 mg/dL for patients with CHD or diabetic patients  with > or = 2 CHD risk factors. Marland Kitchen LDL-C is now calculated using the Martin-Hopkins  calculation, which is a validated novel method providing  better accuracy than the Friedewald equation in the  estimation of LDL-C.  Horald Pollen et al. Lenox Ahr.  9604;540(98): 2061-2068  (http://education.[facility].com/faq/FAQ164)    LDLDIRECT 80.0 09/30/2022   TRIG 335.0 (H) 09/30/2022   CHOLHDL 5 09/30/2022   Lab Results  Component Value Date   MICRALBCREAT 13 05/15/2023   MICRALBCREAT 0.9 05/22/2022   Lab Results  Component Value Date   CREATININE 0.78 04/17/2023   Lab Results  Component Value Date   GFR 95.56 02/03/2023    ASSESSMENT / PLAN  1. Uncontrolled type 2 diabetes mellitus with hyperglycemia, with long-term current use of insulin (HCC)     Diabetes Mellitus type 2, complicated by no known complications. - Diabetic status / severity: Uncontrolled  Lab Results  Component Value Date   HGBA1C 8.5 (A) 06/12/2023    - Hemoglobin A1c goal : <7%  Discussed about type 2 diabetes mellitus and importance of blood sugar control and to prevent chronic diabetic complications.  Discussed about compliance with medications.  Discussed about importance of blood sugar monitoring and checking.  Patient is advised to use Dexcom CGM he has at home.  - Medications: See below  I) increase Mounjaro from 12.5 to 15 mg weekly. II) continue Farxiga 10 mg daily. III) continue Humulin R U-500 insulin 150 - 130 - 140 units with meals 3 times a day.  - Home glucose testing: 3-4 times a day.  He has Dexcom G7 advised to use it. - Discussed/ Gave Hypoglycemia treatment plan.  Glucagon Emergency Kit prescribed.  Advised to use glucose tablets for correction of hypoglycemia.  Discussed that patient is on high-dose of concentrated insulin and has risks of having hypoglycemia.  # Consult : not required at this time.   # Annual urine for microalbuminuria/ creatinine ratio, no microalbuminuria currently, continue ACE/ARB/lisinopril. Last  Lab Results  Component Value Date   MICRALBCREAT 13 05/15/2023    # Foot check nightly.  # Annual dilated diabetic eye exams.   - Diet: Make healthy diabetic food choices - Life style / activity /  exercise: Discussed.  2.  Blood pressure  -  BP Readings from Last 1 Encounters:  06/12/23 132/60    - Control is in target.  - No change in current plans.  3. Lipid status / Hyperlipidemia - Last  Lab Results  Component Value Date   [facility]  02/09/2021     Comment:     . LDL cholesterol not calculated. Triglyceride levels greater than 400 mg/dL invalidate calculated LDL results. . Reference range: <100 . Desirable range <100 mg/dL for primary prevention;   <70 mg/dL for patients with CHD or diabetic patients  with > or = 2 CHD risk factors. Marland Kitchen LDL-C is now calculated using the Martin-Hopkins  calculation, which is a validated novel method providing  better accuracy than the Friedewald equation in the  estimation of LDL-C.  Horald Pollen et al. Lenox Ahr. 1610;960(45): 2061-2068  (http://education.[facility].com/faq/FAQ164)    - Continue rosuvastatin 5 mg daily.  Diagnoses and all orders for this visit:  Uncontrolled type 2 diabetes mellitus with hyperglycemia, with long-term current use of insulin (HCC) -     POCT glycosylated hemoglobin (Hb A1C)  Other orders -     tirzepatide (MOUNJARO) 15 MG/0.5ML Pen; Inject 15 mg into the skin once a week.    DISPOSITION Follow up in clinic in 3 months suggested.   All questions answered and patient verbalized understanding of the plan.  Larry Navreet Bolda, MD [facility] Endocrinology [facility] [address] [place], [address] [place], [place] [address] Phone # [phone]  At least part of this note was generated using voice recognition software. Inadvertent word errors may have occurred, which were not recognized during the proofreading process.

## 2023-06-12 NOTE — Patient Instructions (Signed)
Increase Mounjaro to 15 mg weekly, rest meds same.  Use DEXCOM to monitor glucose.

## 2023-06-13 ENCOUNTER — Other Ambulatory Visit (HOSPITAL_COMMUNITY): Payer: Self-pay

## 2023-06-28 ENCOUNTER — Other Ambulatory Visit (HOSPITAL_COMMUNITY): Payer: Self-pay

## 2023-06-30 ENCOUNTER — Other Ambulatory Visit: Payer: Self-pay

## 2023-06-30 ENCOUNTER — Other Ambulatory Visit (HOSPITAL_COMMUNITY): Payer: Self-pay

## 2023-07-01 ENCOUNTER — Other Ambulatory Visit (HOSPITAL_COMMUNITY): Payer: Self-pay

## 2023-08-05 ENCOUNTER — Other Ambulatory Visit (HOSPITAL_COMMUNITY): Payer: Self-pay

## 2023-08-07 ENCOUNTER — Ambulatory Visit: Payer: Commercial Managed Care - PPO

## 2023-08-07 DIAGNOSIS — E782 Mixed hyperlipidemia: Secondary | ICD-10-CM

## 2023-08-15 ENCOUNTER — Other Ambulatory Visit (HOSPITAL_COMMUNITY): Payer: Self-pay

## 2023-08-18 ENCOUNTER — Other Ambulatory Visit (HOSPITAL_COMMUNITY): Payer: Self-pay

## 2023-08-24 ENCOUNTER — Other Ambulatory Visit: Payer: Self-pay | Admitting: Endocrinology

## 2023-08-24 DIAGNOSIS — E1165 Type 2 diabetes mellitus with hyperglycemia: Secondary | ICD-10-CM

## 2023-08-25 ENCOUNTER — Other Ambulatory Visit: Payer: Self-pay

## 2023-08-25 ENCOUNTER — Other Ambulatory Visit (HOSPITAL_COMMUNITY): Payer: Self-pay

## 2023-08-25 MED ORDER — HUMULIN R U-500 KWIKPEN 500 UNIT/ML ~~LOC~~ SOPN
PEN_INJECTOR | SUBCUTANEOUS | 1 refills | Status: DC
  Filled 2023-08-25: qty 48, 57d supply, fill #0
  Filled 2023-10-23: qty 48, 57d supply, fill #1

## 2023-08-26 ENCOUNTER — Other Ambulatory Visit (HOSPITAL_COMMUNITY): Payer: Self-pay

## 2023-09-05 ENCOUNTER — Other Ambulatory Visit (HOSPITAL_COMMUNITY): Payer: Self-pay

## 2023-09-05 ENCOUNTER — Other Ambulatory Visit: Payer: Self-pay | Admitting: Endocrinology

## 2023-09-05 DIAGNOSIS — E1165 Type 2 diabetes mellitus with hyperglycemia: Secondary | ICD-10-CM

## 2023-09-05 MED ORDER — DAPAGLIFLOZIN PROPANEDIOL 10 MG PO TABS
10.0000 mg | ORAL_TABLET | Freq: Every day | ORAL | 2 refills | Status: DC
  Filled 2023-09-05: qty 30, 30d supply, fill #0
  Filled 2023-09-30: qty 30, 30d supply, fill #1
  Filled 2023-10-31: qty 30, 30d supply, fill #2

## 2023-09-16 ENCOUNTER — Ambulatory Visit: Payer: Commercial Managed Care - PPO | Admitting: Endocrinology

## 2023-09-22 ENCOUNTER — Encounter: Payer: Self-pay | Admitting: Endocrinology

## 2023-09-22 ENCOUNTER — Ambulatory Visit: Payer: Commercial Managed Care - PPO | Admitting: Endocrinology

## 2023-09-22 VITALS — BP 130/78 | HR 76 | Ht 69.0 in | Wt 296.0 lb

## 2023-09-22 DIAGNOSIS — E1165 Type 2 diabetes mellitus with hyperglycemia: Secondary | ICD-10-CM

## 2023-09-22 DIAGNOSIS — Z794 Long term (current) use of insulin: Secondary | ICD-10-CM

## 2023-09-22 LAB — POCT GLYCOSYLATED HEMOGLOBIN (HGB A1C): Hemoglobin A1C: 7.9 % — AB (ref 4.0–5.6)

## 2023-09-22 NOTE — Patient Instructions (Signed)
Latest Reference Range & Units 02/03/23 16:20 06/12/23 14:34 09/22/23 15:59  Hemoglobin A1C 4.0 - 5.6 % -  8.5 (H) 8.5 ! Pend 7.9 ! Pend  (H): Data is abnormally high !: Data is abnormal

## 2023-09-22 NOTE — Progress Notes (Signed)
Outpatient Endocrinology Note [place] Deshane Cotroneo, MD  09/22/23  Patient's Name: Larry Rose    DOB: 1966-06-26    MRN: [id number]                                                    REASON OF VISIT: Follow up for type 2 diabetes mellitus  PCP: Octavia Heir, NP  HISTORY OF PRESENT ILLNESS:   Larry Rose is a 58 y.o. old male with past medical history listed below, is here for follow up for type 2 diabetes mellitus.   Pertinent Diabetes History: Patient was diagnosed with type 2 diabetes mellitus in 1993.  He was started on insulin therapy in 2009.  His prior medication includes metformin, Farxiga and Trulicity.  He had variable control of blood sugar/diabetes with hemoglobin A1c in the range of 6.6 to 9%.  Chronic Diabetes Complications : Retinopathy: no. Last ophthalmology exam was done on annually, reportedly. Nephropathy: no, on lisinopril Peripheral neuropathy: no Coronary artery disease: no Stroke: no  Relevant comorbidities and cardiovascular risk factors: Obesity: yes Body mass index is 43.71 kg/m.  Hypertension: yes Hyperlipidemia. Yes, on statin  Current / Home Diabetic regimen includes: Humulin R U-500 insulin 150 - 130 - 150 units   with meals 3 times a day. Farxiga 10 mg daily. Mounjaro 15 mg weekly.   Prior diabetic medications: Trulicity stopped due to diarrhea.  Glycemic data:   No glucometer data to review.  He has not been checking blood sugar lately at home.  Denies hypoglycemic symptoms.  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  Hypoglycemia: Patient denies hypoglycemic episodes, reportedly, no glucometer data to review. Patient has hypoglycemia awareness.   Factors modifying glucose control: 1.  Diabetic diet assessment: 3 meals a day, frequently snacking.  2.  Staying active or exercising: No formal exercise.  Active  at work.  Works for [employer].  3.  Medication compliance: compliant most of the time.  Interval history Patient has not started Dexcom CGM.  He has also not been checking blood sugar lately.  Patient reports that his wife brought test strips to check the blood sugar at home for him.  Hemoglobin A1c today 7.9% mildly improved.  Diabetes regimen reviewed and as noted above.  He denies any GI issues with increased dose of Mounjaro.  He reports compliance with insulin.  He denies any hypoglycemic symptoms.  REVIEW OF SYSTEMS As per history of present illness.   PAST MEDICAL HISTORY: Past Medical History:  Diagnosis Date   ALCOHOLISM 11/30/2009   ALLERGIC RHINITIS 04/01/2007   CARPAL TUNNEL SYNDROME, RIGHT 10/06/2008   DIABETES MELLITUS, TYPE II 04/01/2007   Hepatic steatosis    HYPERLIPIDEMIA, ATHEROGENIC 09/29/2007   HYPERTENSION 04/01/2007   Rosacea 05/14/2010   TINEA CRURIS 11/07/2008    PAST SURGICAL HISTORY: Past Surgical History:  Procedure Laterality Date   carpel tunnel     FRACTURE SURGERY      ALLERGIES: Allergies  Allergen Reactions   Enalapril Maleate    Lovastatin     FAMILY HISTORY:  Family History  Problem Relation Age of Onset   Hypertension Father    Pneumonia Father    Diabetes Maternal  Grandmother    Heart attack Maternal Grandfather    Stroke Paternal Grandfather    Cancer Neg Hx     SOCIAL HISTORY: Social History   Socioeconomic History   Marital status: Married    Spouse name: Not on file   Number of children: Not on file   Years of education: Not on file   Highest education level: Not on file  Occupational History   Occupation: Works Quarry manager  Tobacco Use   Smoking status: Never   Smokeless tobacco: Never  Vaping Use   Vaping status: Never Used  Substance and Sexual Activity   Alcohol use: Yes    Alcohol/week: 4.0 - 5.0 standard drinks of alcohol    Types: 4 - 5 Standard drinks or equivalent per week   Drug use: Not  Currently   Sexual activity: Not on file  Other Topics Concern   Not on file  Social History Narrative   Not on file   Social Drivers of Health   Financial Resource Strain: Patient Declined (05/13/2023)   Overall Financial Resource Strain (CARDIA)    Difficulty of Paying Living Expenses: Patient declined  Food Insecurity: Patient Declined (05/13/2023)   Hunger Vital Sign    Worried About Running Out of Food in the Last Year: Patient declined    Ran Out of Food in the Last Year: Patient declined  Transportation Needs: Patient Declined (05/13/2023)   PRAPARE - Administrator, Civil Service (Medical): Patient declined    Lack of Transportation (Non-Medical): Patient declined  Physical Activity: Not on file  Stress: Not on file  Social Connections: Unknown (05/13/2023)   Social Connection and Isolation Panel [NHANES]    Frequency of Communication with Friends and Family: Patient declined    Frequency of Social Gatherings with Friends and Family: Patient declined    Attends Religious Services: Patient declined    Database administrator or Organizations: Patient declined    Attends Engineer, structural: Not on file    Marital Status: Patient declined    MEDICATIONS:  Current Outpatient Medications  Medication Sig Dispense Refill   BD PEN NEEDLE NANO 2ND GEN 32G X 4 MM MISC USE 3 TIMES PER DAY. 100 each 10   Colchicine (MITIGARE) 0.6 MG CAPS Take 1 capsule by mouth as needed.     Continuous Glucose Sensor (DEXCOM G7 SENSOR) MISC Use to check glucose continuously 9 each 2   dapagliflozin propanediol (FARXIGA) 10 MG TABS tablet Take 1 tablet (10 mg total) by mouth daily before breakfast. 30 tablet 2   furosemide (LASIX) 20 MG tablet Take 1 tablet (20 mg total) by mouth daily as needed. 30 tablet 3   Glucagon (GVOKE HYPOPEN 1-PACK) 1 MG/0.2ML SOAJ Inject 1 mg into the skin as needed (low blood sugar with impaired consciousness). 0.4 mL 2   Insulin Pen Needle (TECHLITE  PEN NEEDLES) 32G X 4 MM MISC Use 3 (three) times daily. 100 each 10   insulin regular human CONCENTRATED (HUMULIN R U-500 KWIKPEN) 500 UNIT/ML KwikPen Inject 150 Units into the skin before morning meal, then inject 130 Units daily before lunch AND 140 Units before evening meal 48 mL 1   lisinopril (ZESTRIL) 20 MG tablet Take 1 tablet (20 mg total) by mouth daily. 90 tablet 1   Multiple Vitamin (MULTIVITAMIN) tablet Take 1 tablet by mouth daily.     rosuvastatin (CRESTOR) 10 MG tablet Take 1 tablet (10 mg total) by mouth daily. 90 tablet 3  tirzepatide (MOUNJARO) 15 MG/0.5ML Pen Inject 15 mg into the skin once a week. 6 mL 3   triamcinolone cream (KENALOG) 0.1 % Apply 1 Application topically as needed.     No current facility-administered medications for this visit.    PHYSICAL EXAM: Vitals:   09/22/23 1556  BP: 130/78  Pulse: 76  SpO2: 95%  Weight: 296 lb (134.3 kg)  Height: 5\' 9"  (1.753 m)    Body mass index is 43.71 kg/m.  Wt Readings from Last 3 Encounters:  09/22/23 296 lb (134.3 kg)  06/12/23 296 lb 9.6 oz (134.5 kg)  05/15/23 (!) 302 lb 12.8 oz (137.3 kg)    General: Well developed, well nourished male in no apparent distress.  HEENT: AT/[place], no external lesions.  Eyes: Conjunctiva clear and no icterus. Neck: Neck supple  Lungs: Respirations not labored Neurologic: Alert, oriented, normal speech Extremities / Skin: Dry. No sores or rashes noted.  Psychiatric: Does not appear depressed or anxious  Diabetic Foot Exam - Simple   No data filed    LABS Reviewed Lab Results  Component Value Date   HGBA1C 7.9 (A) 09/22/2023   HGBA1C 8.5 (A) 06/12/2023   HGBA1C 8.5 (H) 02/03/2023   No results found for: "FRUCTOSAMINE" Lab Results  Component Value Date   CHOL 165 09/30/2022   HDL 34.10 (L) 09/30/2022   LDLCALC  02/09/2021     Comment:     . LDL cholesterol not calculated. Triglyceride levels greater than 400 mg/dL invalidate calculated LDL results. . Reference  range: <100 . Desirable range <100 mg/dL for primary prevention;   <70 mg/dL for patients with CHD or diabetic patients  with > or = 2 CHD risk factors. Marland Kitchen LDL-C is now calculated using the Martin-Hopkins  calculation, which is a validated novel method providing  better accuracy than the Friedewald equation in the  estimation of LDL-C.  Horald Pollen et al. Lenox Ahr. 2130;865(78): 2061-2068  (http://education.[facility].com/faq/FAQ164)    LDLDIRECT 80.0 09/30/2022   TRIG 335.0 (H) 09/30/2022   CHOLHDL 5 09/30/2022   Lab Results  Component Value Date   MICRALBCREAT 13 05/15/2023   MICRALBCREAT 0.9 05/22/2022   Lab Results  Component Value Date   CREATININE 0.78 04/17/2023   Lab Results  Component Value Date   GFR 95.56 02/03/2023    ASSESSMENT / PLAN  1. Uncontrolled type 2 diabetes mellitus with hyperglycemia, with long-term current use of insulin (HCC)      Diabetes Mellitus type 2, complicated by no known complications. - Diabetic status / severity: Uncontrolled  Lab Results  Component Value Date   HGBA1C 7.9 (A) 09/22/2023    - Hemoglobin A1c goal : <7%  Discussed about type 2 diabetes mellitus and importance of blood sugar control and to prevent chronic diabetic complications.  Discussed about compliance with medications.  Discussed about importance of blood sugar monitoring and checking.  He has significant high insulin resistance.  - Medications: See below  I) continue Mounjaro 15 mg weekly. II) continue Farxiga 10 mg daily. III) continue Humulin R U-500 insulin 150 - 130 - 150 units with meals 3 times a day.  Advised to adjust +/- 10 units based on his blood sugar before eating.  - Home glucose testing: 3-4 times a day.  He has Dexcom G7 advised to use it.  Discussed that it is important to monitor blood sugar especially he is taking significant high dose of insulin.  There is weeks of hypoglycemia which can be life-threatening.  He probably  does not want to  use CGM due to inconvenience with his nature of work he works at Editor, commissioning at the hospital.  He would like to check with fingerstick blood sugar.  I advised to take before meals and at bedtime 3-4 times a day.  - Discussed/ Gave Hypoglycemia treatment plan.  Glucagon Emergency Kit prescribed.  Advised to use glucose tablets for correction of hypoglycemia.  Discussed that patient is on high-dose of concentrated insulin and has risks of having hypoglycemia.  # Consult : not required at this time.   # Annual urine for microalbuminuria/ creatinine ratio, no microalbuminuria currently, continue ACE/ARB/lisinopril. Last  Lab Results  Component Value Date   MICRALBCREAT 13 05/15/2023    # Foot check nightly.  # Annual dilated diabetic eye exams.   - Diet: Make healthy diabetic food choices - Life style / activity / exercise: Discussed.  2. Blood pressure  -  BP Readings from Last 1 Encounters:  09/22/23 130/78    - Control is in target.  - No change in current plans.  3. Lipid status / Hyperlipidemia - Last  Lab Results  Component Value Date   [facility]  02/09/2021     Comment:     . LDL cholesterol not calculated. Triglyceride levels greater than 400 mg/dL invalidate calculated LDL results. . Reference range: <100 . Desirable range <100 mg/dL for primary prevention;   <70 mg/dL for patients with CHD or diabetic patients  with > or = 2 CHD risk factors. Marland Kitchen LDL-C is now calculated using the Martin-Hopkins  calculation, which is a validated novel method providing  better accuracy than the Friedewald equation in the  estimation of LDL-C.  Horald Pollen et al. Lenox Ahr. 1914;782(95): 2061-2068  (http://education.[facility].com/faq/FAQ164)    - Continue rosuvastatin 5 mg daily.  Managed by primary care provider.  Diagnoses and all orders for this visit:  Uncontrolled type 2 diabetes mellitus with hyperglycemia, with long-term current use of insulin (HCC) -     POCT  glycosylated hemoglobin (Hb A1C)     DISPOSITION Follow up in clinic in 3 months suggested.   All questions answered and patient verbalized understanding of the plan.  [place] Arrayah Connors, MD [facility] [address] [place], [address] [place], [place] [address] Phone # [phone]  At least part of this note was generated using voice recognition software. Inadvertent word errors may have occurred, which were not recognized during the proofreading process.

## 2023-10-03 ENCOUNTER — Other Ambulatory Visit: Payer: Self-pay | Admitting: Orthopedic Surgery

## 2023-10-03 ENCOUNTER — Other Ambulatory Visit (HOSPITAL_COMMUNITY): Payer: Self-pay

## 2023-10-03 ENCOUNTER — Other Ambulatory Visit: Payer: Self-pay

## 2023-10-03 DIAGNOSIS — I10 Essential (primary) hypertension: Secondary | ICD-10-CM

## 2023-10-03 MED ORDER — FUROSEMIDE 20 MG PO TABS
20.0000 mg | ORAL_TABLET | Freq: Every day | ORAL | 3 refills | Status: DC | PRN
  Filled 2023-10-03: qty 30, 30d supply, fill #0
  Filled 2023-11-11: qty 30, 30d supply, fill #1
  Filled 2023-11-19 – 2023-12-26 (×2): qty 30, 30d supply, fill #2
  Filled 2024-01-29: qty 30, 30d supply, fill #3

## 2023-10-16 ENCOUNTER — Other Ambulatory Visit (HOSPITAL_COMMUNITY): Payer: Self-pay

## 2023-10-23 ENCOUNTER — Other Ambulatory Visit: Payer: Self-pay

## 2023-10-24 ENCOUNTER — Other Ambulatory Visit (HOSPITAL_COMMUNITY): Payer: Self-pay

## 2023-11-06 ENCOUNTER — Ambulatory Visit: Payer: Commercial Managed Care - PPO | Admitting: Cardiology

## 2023-11-13 ENCOUNTER — Ambulatory Visit: Payer: Commercial Managed Care - PPO | Admitting: Orthopedic Surgery

## 2023-11-13 ENCOUNTER — Encounter: Payer: Self-pay | Admitting: Orthopedic Surgery

## 2023-11-13 VITALS — BP 128/68 | HR 64 | Temp 97.3°F | Resp 17 | Ht 69.0 in | Wt 301.8 lb

## 2023-11-13 DIAGNOSIS — Z6841 Body Mass Index (BMI) 40.0 and over, adult: Secondary | ICD-10-CM | POA: Diagnosis not present

## 2023-11-13 DIAGNOSIS — I10 Essential (primary) hypertension: Secondary | ICD-10-CM

## 2023-11-13 DIAGNOSIS — E1165 Type 2 diabetes mellitus with hyperglycemia: Secondary | ICD-10-CM

## 2023-11-13 DIAGNOSIS — I2510 Atherosclerotic heart disease of native coronary artery without angina pectoris: Secondary | ICD-10-CM | POA: Diagnosis not present

## 2023-11-13 DIAGNOSIS — Z794 Long term (current) use of insulin: Secondary | ICD-10-CM | POA: Diagnosis not present

## 2023-11-13 NOTE — Progress Notes (Signed)
 Careteam: Patient Care Team: Octavia Heir, NP as PCP - General (Adult Health Nurse Practitioner) Gelene Mink, OD as Referring Physician (Optometry)  Seen by: Hazle Nordmann, AGNP-C  PLACE OF SERVICE:  [facility]  Advanced Directive information Does Patient Have a Medical Advance Directive?: No, Would patient like information on creating a medical advance directive?: No - Patient declined  Allergies  Allergen Reactions   Enalapril Maleate    Lovastatin     Chief Complaint  Patient presents with   Medical Management of Chronic Issues    6 month follow up. Discuss the need for Influenza vaccine, Covid Booster, and Shingrix vaccine. Discuss the need for Eye exam.     HPI: Patient is a 58 y.o. male seen today for medical management of chronic conditions.   Discussed the use of AI scribe software for clinical note transcription with the patient, who gave verbal consent to proceed.  History of Present Illness    Larry Rose "Larry Rose" is a 58 year old male who presents for a routine checkup.  He is currently managing type 2 diabetes mellitus with Greggory Keen, Farxiga, and Humulin insulin on a sliding scale. He experiences difficulty wearing the continuous glucose monitor consistently due to discomfort. His blood glucose levels have not dropped below 70 mg/dL. His hemoglobin A1c has increased from 7.4% a year ago to 7.9% currently, with a previous high of 8.8%. He has not had his annual diabetic eye exam this year. Last year's exam indicated dry eyes, but no significant findings were reported. Followed by Dr. Sharlot Gowda.    BMI 44.57. He has been on increased Mounjaro since October, 2024. He has lost about 35 lbs since starting Mounjaro.   He has not received a COVID-19 vaccine this year and is uncertain about future vaccinations. He has received two flu shots in his lifetime, with the second one this year. He has not contracted COVID-19 this year.  No abdominal pain, blood in stool,  urinary issues, chest pain, shortness of breath, or skin concerns beyond dry skin and freckles. Occasional constipation and diarrhea, attributed to [facility] and insufficient water intake. No depression or anxiety.     Does not plan to have future covid vaccinations.   Discussed Shingrix vaccine today.     Review of Systems:  Review of Systems  Constitutional: Negative.   HENT: Negative.    Eyes: Negative.   Respiratory: Negative.    Gastrointestinal:  Positive for constipation and diarrhea.  Genitourinary: Negative.   Musculoskeletal: Negative.   Skin: Negative.   Neurological: Negative.   Psychiatric/Behavioral: Negative.      Past Medical History:  Diagnosis Date   ALCOHOLISM 11/30/2009   ALLERGIC RHINITIS 04/01/2007   CARPAL TUNNEL SYNDROME, RIGHT 10/06/2008   DIABETES MELLITUS, TYPE II 04/01/2007   Hepatic steatosis    HYPERLIPIDEMIA, ATHEROGENIC 09/29/2007   HYPERTENSION 04/01/2007   Rosacea 05/14/2010   TINEA CRURIS 11/07/2008   Past Surgical History:  Procedure Laterality Date   carpel tunnel     FRACTURE SURGERY     Social History:   reports that he has never smoked. He has never used smokeless tobacco. He reports current alcohol use of about 4.0 - 5.0 standard drinks of alcohol per week. He reports that he does not currently use drugs.  Family History  Problem Relation Age of Onset   Hypertension Father    Pneumonia Father    Diabetes Maternal Grandmother    Heart attack Maternal Grandfather    Stroke  Paternal Grandfather    Cancer Neg Hx     Medications: Patient's Medications  New Prescriptions   No medications on file  Previous Medications   BD PEN NEEDLE NANO 2ND GEN 32G X 4 MM MISC    USE 3 TIMES PER DAY.   COLCHICINE (MITIGARE) 0.6 MG CAPS    Take 1 capsule by mouth as needed.   CONTINUOUS GLUCOSE SENSOR (DEXCOM G7 SENSOR) MISC    Use to check glucose continuously   DAPAGLIFLOZIN PROPANEDIOL (FARXIGA) 10 MG TABS TABLET    Take 1 tablet (10 mg total)  by mouth daily before breakfast.   FUROSEMIDE (LASIX) 20 MG TABLET    Take 1 tablet (20 mg total) by mouth daily as needed.   GLUCAGON (GVOKE HYPOPEN 1-PACK) 1 MG/0.2ML SOAJ    Inject 1 mg into the skin as needed (low blood sugar with impaired consciousness).   INSULIN PEN NEEDLE (TECHLITE PEN NEEDLES) 32G X 4 MM MISC    Use 3 (three) times daily.   INSULIN REGULAR HUMAN CONCENTRATED (HUMULIN R U-500 KWIKPEN) 500 UNIT/ML KWIKPEN    Inject 150 Units into the skin before morning meal, then inject 130 Units daily before lunch AND 140 Units before evening meal   LISINOPRIL (ZESTRIL) 20 MG TABLET    Take 1 tablet (20 mg total) by mouth daily.   MULTIPLE VITAMIN (MULTIVITAMIN) TABLET    Take 1 tablet by mouth daily.   ROSUVASTATIN (CRESTOR) 10 MG TABLET    Take 1 tablet (10 mg total) by mouth daily.   TIRZEPATIDE (MOUNJARO) 15 MG/0.5ML PEN    Inject 15 mg into the skin once a week.   TRIAMCINOLONE CREAM (KENALOG) 0.1 %    Apply 1 Application topically as needed.  Modified Medications   No medications on file  Discontinued Medications   No medications on file    Physical Exam:  Vitals:   11/13/23 0852  BP: (!) 140/68  Pulse: 64  Resp: 17  Temp: (!) 97.3 F (36.3 C)  SpO2: 93%  Weight: (!) 301 lb 12.8 oz (136.9 kg)  Height: 5\' 9"  (1.753 m)   Body mass index is 44.57 kg/m. Wt Readings from Last 3 Encounters:  11/13/23 (!) 301 lb 12.8 oz (136.9 kg)  09/22/23 296 lb (134.3 kg)  06/12/23 296 lb 9.6 oz (134.5 kg)    Physical Exam Vitals reviewed.  Constitutional:      General: He is not in acute distress.    Appearance: He is obese.  HENT:     Head: Normocephalic.  Eyes:     General:        Right eye: No discharge.        Left eye: No discharge.  Neck:     Vascular: No carotid bruit.  Cardiovascular:     Rate and Rhythm: Normal rate and regular rhythm.     Pulses: Normal pulses.     Heart sounds: Normal heart sounds.  Pulmonary:     Effort: Pulmonary effort is normal.      Breath sounds: Normal breath sounds.  Abdominal:     General: Bowel sounds are normal.     Palpations: Abdomen is soft.  Musculoskeletal:     Cervical back: Neck supple.     Right lower leg: No edema.     Left lower leg: No edema.  Skin:    General: Skin is warm.     Capillary Refill: Capillary refill takes less than 2 seconds.  Neurological:  General: No focal deficit present.     Mental Status: He is alert and oriented to person, place, and time.  Psychiatric:        Mood and Affect: Mood normal.     Labs reviewed: Basic Metabolic Panel: Recent Labs    02/03/23 1620 04/17/23 0936 05/15/23 0938  NA 141 140  --   K 4.0 4.2  --   CL 102 100  --   CO2 27 24  --   GLUCOSE 111* 215*  --   BUN 16 16  --   CREATININE 0.91 0.78  --   CALCIUM 9.7 9.2  --   TSH  --   --  3.52   Liver Function Tests: No results for input(s): "AST", "ALT", "ALKPHOS", "BILITOT", "PROT", "ALBUMIN" in the last 8760 hours. No results for input(s): "LIPASE", "AMYLASE" in the last 8760 hours. No results for input(s): "AMMONIA" in the last 8760 hours. CBC: No results for input(s): "WBC", "NEUTROABS", "HGB", "HCT", "MCV", "PLT" in the last 8760 hours. Lipid Panel: No results for input(s): "CHOL", "HDL", "LDLCALC", "TRIG", "CHOLHDL", "LDLDIRECT" in the last 8760 hours. TSH: Recent Labs    05/15/23 0938  TSH 3.52   A1C: Lab Results  Component Value Date   HGBA1C 7.9 (A) 09/22/2023     Assessment/Plan 1. Morbid obesity with BMI of 40.0-44.9, adult (HCC) (Primary) - ongoing - on Mounjaro per endocrinology - continue diet low in carbs and sugars - recommend limiting calories < 1800/day - recommend 150 min exercise weekly  2. Essential hypertension - controlled with lisinopril  3. Uncontrolled diabetes mellitus with hyperglycemia, with long-term current use of insulin (HCC) - A1c 7.9 - followed by endocrinology - no hypoglycemia - needs yearly eye exam - cont Humulin, mounjaro and  Farxiga  4. Coronary artery disease without angina pectoris, unspecified vessel or lesion type, unspecified whether native or transplanted heart - followed by cardiology - repeat lipid panel - blood pressure at goal   Total time: 32 minutes. Greater than 50% of total time spent doing patient education regarding obesity, weight loss, T2DM, HTN and CAD including symptom/medication management.    Next appt: Visit date not found  Jenette Rayson Scherry Ran  [facility] [phone]

## 2023-11-13 NOTE — Patient Instructions (Addendum)
 Please schedule yearly diabetic eye exam with Dr. Sharlot Gowda   Please schedule fasting lab appointment> may only have water before appointment   Consider getting Shingrix Shingles vaccine at local pharmacy

## 2023-11-18 ENCOUNTER — Other Ambulatory Visit

## 2023-11-18 DIAGNOSIS — I2510 Atherosclerotic heart disease of native coronary artery without angina pectoris: Secondary | ICD-10-CM | POA: Diagnosis not present

## 2023-11-18 DIAGNOSIS — E781 Pure hyperglyceridemia: Secondary | ICD-10-CM

## 2023-11-18 DIAGNOSIS — I10 Essential (primary) hypertension: Secondary | ICD-10-CM | POA: Diagnosis not present

## 2023-11-19 ENCOUNTER — Encounter: Payer: Self-pay | Admitting: Orthopedic Surgery

## 2023-11-19 ENCOUNTER — Other Ambulatory Visit (HOSPITAL_COMMUNITY): Payer: Self-pay

## 2023-11-19 ENCOUNTER — Other Ambulatory Visit: Payer: Self-pay

## 2023-11-19 LAB — LIPID PANEL
Cholesterol: 186 mg/dL (ref <200)
HDL: 35 mg/dL — ABNORMAL LOW (ref >=40)
Non-HDL Cholesterol (Calc): 151 mg/dL — ABNORMAL HIGH (ref <130)
Total CHOL/HDL Ratio: 5.3 (calc) — ABNORMAL HIGH (ref <5.0)
Triglycerides: 716 mg/dL — ABNORMAL HIGH (ref <150)

## 2023-11-19 LAB — COMPLETE METABOLIC PANEL WITHOUT GFR
AG Ratio: 1.7 (calc) (ref 1.0–2.5)
ALT: 23 U/L (ref 9–46)
AST: 22 U/L (ref 10–35)
Albumin: 4.2 g/dL (ref 3.6–5.1)
Alkaline phosphatase (APISO): 49 U/L (ref 35–144)
BUN: 15 mg/dL (ref 7–25)
CO2: 28 mmol/L (ref 20–32)
Calcium: 8.8 mg/dL (ref 8.6–10.3)
Chloride: 100 mmol/L (ref 98–110)
Creat: 0.7 mg/dL (ref 0.70–1.30)
Globulin: 2.5 g/dL (ref 1.9–3.7)
Glucose, Bld: 228 mg/dL — ABNORMAL HIGH (ref 65–99)
Potassium: 5.2 mmol/L (ref 3.5–5.3)
Sodium: 135 mmol/L (ref 135–146)
Total Bilirubin: 0.5 mg/dL (ref 0.2–1.2)
Total Protein: 6.7 g/dL (ref 6.1–8.1)

## 2023-11-19 LAB — CBC WITH DIFFERENTIAL/PLATELET
Absolute Lymphocytes: 1891 {cells}/uL (ref 850–3900)
Absolute Monocytes: 521 {cells}/uL (ref 200–950)
Basophils Absolute: 19 {cells}/uL (ref 0–200)
Basophils Relative: 0.3 %
Eosinophils Absolute: 211 {cells}/uL (ref 15–500)
Eosinophils Relative: 3.4 %
HCT: 44.5 % (ref 38.5–50.0)
Hemoglobin: 15 g/dL (ref 13.2–17.1)
MCH: 31.4 pg (ref 27.0–33.0)
MCHC: 33.7 g/dL (ref 32.0–36.0)
MCV: 93.3 fL (ref 80.0–100.0)
MPV: 11.7 fL (ref 7.5–12.5)
Monocytes Relative: 8.4 %
Neutro Abs: 3559 {cells}/uL (ref 1500–7800)
Neutrophils Relative %: 57.4 %
Platelets: 179 10*3/uL (ref 140–400)
RBC: 4.77 10*6/uL (ref 4.20–5.80)
RDW: 13.2 % (ref 11.0–15.0)
Total Lymphocyte: 30.5 %
WBC: 6.2 10*3/uL (ref 3.8–10.8)

## 2023-11-19 MED ORDER — FENOFIBRATE 145 MG PO TABS
145.0000 mg | ORAL_TABLET | Freq: Every day | ORAL | 2 refills | Status: DC
Start: 2023-11-19 — End: 2024-01-22
  Filled 2023-11-19: qty 90, 90d supply, fill #0

## 2023-11-19 MED ORDER — OMEGA-3-ACID ETHYL ESTERS 1 G PO CAPS
1.0000 g | ORAL_CAPSULE | Freq: Two times a day (BID) | ORAL | 2 refills | Status: DC
  Filled 2023-11-19 – 2023-11-24 (×3): qty 180, 90d supply, fill #0
  Filled 2024-02-19: qty 180, 90d supply, fill #1
  Filled 2024-05-28: qty 180, 90d supply, fill #2

## 2023-11-20 ENCOUNTER — Other Ambulatory Visit (HOSPITAL_COMMUNITY): Payer: Self-pay

## 2023-11-20 ENCOUNTER — Encounter: Payer: Self-pay | Admitting: Orthopedic Surgery

## 2023-11-20 ENCOUNTER — Other Ambulatory Visit: Payer: Self-pay

## 2023-11-20 ENCOUNTER — Telehealth: Payer: Self-pay

## 2023-11-20 NOTE — Telephone Encounter (Signed)
 Incoming fax received from patients pharmacy to initiate a prior authorization for                   .  PA initiated through covermymeds. Key: [id number]  Awaiting reply from the insurance company which will be determined in 48-72 hours.

## 2023-11-20 NOTE — Telephone Encounter (Signed)
 Spoke with patient and understand by verbalized the side effects of taking the medication together. Patient wanted to ask how will be on this medication because his Triglycerides is always high. Patient also has made his appointment on Thursday July 31 st in the morning because that is the only he was available to do labs. Please Advise   Message sent to Octavia Heir, NP

## 2023-11-24 ENCOUNTER — Other Ambulatory Visit (HOSPITAL_COMMUNITY): Payer: Self-pay

## 2023-11-24 NOTE — Telephone Encounter (Signed)
 Patient stated that his cholesterol was probably high due to his Drinking. Stated that he has purchased OTC Fish Oil and will continue to take that and would like his cholesterol rechecked in 3 months before taking the Fenofibrate due to all the side effects.

## 2023-11-24 NOTE — Telephone Encounter (Signed)
 Plan to continue Fenofibrate at this time. Patient may purchase fish oil supplement over the counter since Lovaza is not covered.

## 2023-11-24 NOTE — Telephone Encounter (Signed)
 Received fax from [employer] stating that they are UNABLE to Authorize the Omega 3 Ethyl Esters 1 Gm at this time.   Approval required to try Generic Fenofibrate First.   Fax forwarded to Hazle Nordmann, NP.

## 2023-11-25 NOTE — Telephone Encounter (Signed)
 He is at high risk for pancreatitis WITHOUT taking fenofibrate. I would rather he take fenofibrate over fish oil. Refraining from alcohol will also help, but he needs medication as well.

## 2023-12-04 ENCOUNTER — Other Ambulatory Visit (HOSPITAL_COMMUNITY): Payer: Self-pay

## 2023-12-04 ENCOUNTER — Other Ambulatory Visit: Payer: Self-pay

## 2023-12-04 ENCOUNTER — Other Ambulatory Visit: Payer: Self-pay | Admitting: Endocrinology

## 2023-12-04 ENCOUNTER — Encounter (HOSPITAL_COMMUNITY): Payer: Self-pay

## 2023-12-04 DIAGNOSIS — Z794 Long term (current) use of insulin: Secondary | ICD-10-CM

## 2023-12-04 MED ORDER — DAPAGLIFLOZIN PROPANEDIOL 10 MG PO TABS
10.0000 mg | ORAL_TABLET | Freq: Every day | ORAL | 2 refills | Status: DC
  Filled 2023-12-04: qty 30, 30d supply, fill #0
  Filled 2023-12-29: qty 30, 30d supply, fill #1
  Filled 2024-01-29: qty 30, 30d supply, fill #2

## 2023-12-19 ENCOUNTER — Other Ambulatory Visit: Payer: Self-pay

## 2023-12-22 ENCOUNTER — Other Ambulatory Visit (HOSPITAL_COMMUNITY): Payer: Self-pay

## 2023-12-25 ENCOUNTER — Encounter: Payer: Self-pay | Admitting: Endocrinology

## 2023-12-25 ENCOUNTER — Ambulatory Visit: Payer: Commercial Managed Care - PPO | Admitting: Endocrinology

## 2023-12-25 VITALS — BP 130/60 | HR 70 | Resp 20 | Ht 69.0 in | Wt 242.6 lb

## 2023-12-25 DIAGNOSIS — Z794 Long term (current) use of insulin: Secondary | ICD-10-CM | POA: Diagnosis not present

## 2023-12-25 DIAGNOSIS — E1165 Type 2 diabetes mellitus with hyperglycemia: Secondary | ICD-10-CM | POA: Diagnosis not present

## 2023-12-25 DIAGNOSIS — E119 Type 2 diabetes mellitus without complications: Secondary | ICD-10-CM | POA: Diagnosis not present

## 2023-12-25 LAB — POCT GLYCOSYLATED HEMOGLOBIN (HGB A1C): Hemoglobin A1C: 7.9 % — AB (ref 4.0–5.6)

## 2023-12-25 NOTE — Progress Notes (Signed)
 Outpatient Endocrinology Note [place] Miku Udall, MD  12/25/23  Patient's Name: Larry Rose    DOB: July 25, 1966    MRN: [id number]                                                    REASON OF VISIT: Follow up for type 2 diabetes mellitus  PCP: Arnetha Bhat, NP  HISTORY OF PRESENT ILLNESS:   Larry Rose is a 58 y.o. old male with past medical history listed below, is here for follow up for type 2 diabetes mellitus.   Pertinent Diabetes History: Patient was diagnosed with type 2 diabetes mellitus in 1993.  He was started on insulin  therapy in 2009.  His prior medication includes metformin, Farxiga  and Trulicity .  He had variable control of blood sugar/diabetes with hemoglobin A1c in the range of 6.6 to 9%.  Chronic Diabetes Complications : Retinopathy: no. Last ophthalmology exam was done on annually, reportedly. Nephropathy: no, on lisinopril  Peripheral neuropathy: no Coronary artery disease: no Stroke: no  Relevant comorbidities and cardiovascular risk factors: Obesity: yes Body mass index is 35.83 kg/m.  Hypertension: yes Hyperlipidemia. Yes, on statin  Current / Home Diabetic regimen includes: Humulin  R U-500 insulin  150 - 130 - 150 units   with meals 3 times a day. Farxiga  10 mg daily. Mounjaro  15 mg weekly.  Prior diabetic medications: Trulicity  stopped due to diarrhea. Metformin : diarrhea.   Glycemic data:    CONTINUOUS GLUCOSE MONITORING SYSTEM (CGMS) INTERPRETATION:   Dexcom G7 CGM-  Sensor Download (Sensor download was reviewed and summarized below.) Dates: April 25 to Dec 25, 2023  Glucose Management Indicator: 8.1%  Sensor usage:83 %    Impression: - Frequent hyperglycemia with blood sugar 200-250 range postprandially especially with breakfast and sometime with supper.  Mostly acceptable blood sugar during the afternoon.  Occasionally mild hyperglycemia overnight.  No hypoglycemia.    Hypoglycemia: Patient denies hypoglycemic episodes,  reportedly, no glucometer data to review. Patient has hypoglycemia awareness.   Factors modifying glucose control: 1.  Diabetic diet assessment: 3 meals a day, frequently snacking.  2.  Staying active or exercising: No formal exercise.  Active at work.  Works for [employer].  3.  Medication compliance: compliant most of the time.  Interval history Dexcom data as reviewed above.  Mostly mild hyperglycemia postprandially.  He reports compliance with insulin  regimen and diabetic regimen as reviewed and noted above.  He reports he stopped drinking alcohol recently.  Hemoglobin A1c 7.9%.  Recent laboratory results reviewed.  He has been tolerating well Mounjaro  and Farxiga .  No other complaints today.  REVIEW OF SYSTEMS As per history of present illness.   PAST MEDICAL HISTORY: Past Medical History:  Diagnosis Date   ALCOHOLISM 11/30/2009   ALLERGIC RHINITIS 04/01/2007   CARPAL TUNNEL SYNDROME, RIGHT 10/06/2008   DIABETES MELLITUS, TYPE II 04/01/2007   Hepatic steatosis    HYPERLIPIDEMIA, ATHEROGENIC 09/29/2007   HYPERTENSION 04/01/2007   Rosacea 05/14/2010   TINEA CRURIS 11/07/2008    PAST SURGICAL HISTORY: Past Surgical History:  Procedure Laterality Date   carpel tunnel     FRACTURE SURGERY      ALLERGIES: Allergies  Allergen Reactions   Enalapril Maleate    Lovastatin     FAMILY HISTORY:  Family History  Problem Relation Age of Onset   Hypertension  Father    Pneumonia Father    Diabetes Maternal Grandmother    Heart attack Maternal Grandfather    Stroke Paternal Grandfather    Cancer Neg Hx     SOCIAL HISTORY: Social History   Socioeconomic History   Marital status: Married    Spouse name: Not on file   Number of children: Not on file   Years of education: Not on file   Highest education level: Not on file  Occupational History   Occupation: Works Quarry manager  Tobacco Use   Smoking status: Never   Smokeless tobacco: Never  Vaping Use   Vaping  status: Never Used  Substance and Sexual Activity   Alcohol use: Yes    Alcohol/week: 4.0 - 5.0 standard drinks of alcohol    Types: 4 - 5 Standard drinks or equivalent per week   Drug use: Not Currently   Sexual activity: Not on file  Other Topics Concern   Not on file  Social History Narrative   Not on file   Social Drivers of Health   Financial Resource Strain: Patient Declined (05/13/2023)   Overall Financial Resource Strain (CARDIA)    Difficulty of Paying Living Expenses: Patient declined  Food Insecurity: No Food Insecurity (11/13/2023)   Hunger Vital Sign    Worried About Running Out of Food in the Last Year: Never true    Ran Out of Food in the Last Year: Never true  Transportation Needs: No Transportation Needs (11/13/2023)   PRAPARE - Administrator, Civil Service (Medical): No    Lack of Transportation (Non-Medical): No  Physical Activity: Not on file  Stress: Not on file  Social Connections: Moderately Integrated (11/13/2023)   Social Connection and Isolation Panel [NHANES]    Frequency of Communication with Friends and Family: Three times a week    Frequency of Social Gatherings with Friends and Family: Three times a week    Attends Religious Services: 1 to 4 times per year    Active Member of Clubs or Organizations: No    Attends Banker Meetings: Never    Marital Status: Married    MEDICATIONS:  Current Outpatient Medications  Medication Sig Dispense Refill   BD PEN NEEDLE NANO 2ND GEN 32G X 4 MM MISC USE 3 TIMES PER DAY. 100 each 10   Colchicine  (MITIGARE ) 0.6 MG CAPS Take 1 capsule by mouth as needed.     Continuous Glucose Sensor (DEXCOM G7 SENSOR) MISC Use to check glucose continuously 9 each 2   dapagliflozin  propanediol (FARXIGA ) 10 MG TABS tablet Take 1 tablet (10 mg total) by mouth daily before breakfast. 30 tablet 2   furosemide  (LASIX ) 20 MG tablet Take 1 tablet (20 mg total) by mouth daily as needed. 30 tablet 3   Glucagon   (GVOKE HYPOPEN  1-PACK) 1 MG/0.2ML SOAJ Inject 1 mg into the skin as needed (low blood sugar with impaired consciousness). 0.4 mL 2   Insulin  Pen Needle (TECHLITE PEN NEEDLES) 32G X 4 MM MISC Use 3 (three) times daily. 100 each 10   insulin  regular human CONCENTRATED (HUMULIN  R U-500 KWIKPEN) 500 UNIT/ML KwikPen Inject 150 Units into the skin before morning meal, then inject 130 Units daily before lunch AND 140 Units before evening meal 48 mL 1   lisinopril  (ZESTRIL ) 20 MG tablet Take 1 tablet (20 mg total) by mouth daily. 90 tablet 1   Multiple Vitamin (MULTIVITAMIN) tablet Take 1 tablet by mouth daily.     omega-3  acid ethyl esters (LOVAZA ) 1 g capsule Take 1 capsule (1 g total) by mouth 2 (two) times daily. 180 capsule 2   rosuvastatin  (CRESTOR ) 10 MG tablet Take 1 tablet (10 mg total) by mouth daily. 90 tablet 3   tirzepatide  (MOUNJARO ) 15 MG/0.5ML Pen Inject 15 mg into the skin once a week. 6 mL 3   triamcinolone  cream (KENALOG ) 0.1 % Apply 1 Application topically as needed.     fenofibrate  (TRICOR ) 145 MG tablet Take 1 tablet (145 mg total) by mouth daily. (Patient not taking: Reported on 12/25/2023) 90 tablet 2   No current facility-administered medications for this visit.    PHYSICAL EXAM: Vitals:   12/25/23 0842  BP: 130/60  Pulse: 70  Resp: 20  SpO2: 98%  Weight: 242 lb 9.6 oz (110 kg)  Height: 5\' 9"  (1.753 m)     Body mass index is 35.83 kg/m.  Wt Readings from Last 3 Encounters:  12/25/23 242 lb 9.6 oz (110 kg)  11/13/23 (!) 301 lb 12.8 oz (136.9 kg)  09/22/23 296 lb (134.3 kg)    General: Well developed, well nourished male in no apparent distress.  HEENT: AT/[place], no external lesions.  Eyes: Conjunctiva clear and no icterus. Neck: Neck supple  Lungs: Respirations not labored Neurologic: Alert, oriented, normal speech Extremities / Skin: Dry.   Psychiatric: Does not appear depressed or anxious  Diabetic Foot Exam - Simple   No data filed    LABS Reviewed Lab  Results  Component Value Date   HGBA1C 7.9 (A) 12/25/2023   HGBA1C 7.9 (A) 09/22/2023   HGBA1C 8.5 (A) 06/12/2023   No results found for: "FRUCTOSAMINE" Lab Results  Component Value Date   CHOL 186 11/18/2023   HDL 35 (L) 11/18/2023   LDLCALC  11/18/2023     Comment:     . LDL cholesterol not calculated. Triglyceride levels greater than 400 mg/dL invalidate calculated LDL results. . Reference range: <100 . Desirable range <100 mg/dL for primary prevention;   <70 mg/dL for patients with CHD or diabetic patients  with > or = 2 CHD risk factors. Aaron Aas LDL-C is now calculated using the Martin-Hopkins  calculation, which is a validated novel method providing  better accuracy than the Friedewald equation in the  estimation of LDL-C.  Melinda Sprawls et al. Erroll Heard. 4098;119(14): 2061-2068  (http://education.[facility].com/faq/FAQ164)    LDLDIRECT 80.0 09/30/2022   TRIG 716 (H) 11/18/2023   CHOLHDL 5.3 (H) 11/18/2023   Lab Results  Component Value Date   MICRALBCREAT 13 05/15/2023   MICRALBCREAT 0.9 05/22/2022   Lab Results  Component Value Date   CREATININE 0.70 11/18/2023   Lab Results  Component Value Date   GFR 95.56 02/03/2023    ASSESSMENT / PLAN  1. Uncontrolled type 2 diabetes mellitus with hyperglycemia, with long-term current use of insulin  (HCC)   2. Type 2 diabetes mellitus without complication, unspecified whether long term insulin  use (HCC)     Diabetes Mellitus type 2, complicated by no known complications. - Diabetic status / severity: Uncontrolled  Lab Results  Component Value Date   HGBA1C 7.9 (A) 12/25/2023    - Hemoglobin A1c goal : <7%  Discussed about type 2 diabetes mellitus and importance of blood sugar control and to prevent chronic diabetic complications.  Discussed about compliance with medications.  Discussed about importance of blood sugar monitoring and checking.  He has significant high insulin  resistance.  CGM data as reviewed above,  still with postprandial hyperglycemia especially in the  morning and in the evening sometime overnight.  - Medications: See below  I) continue Mounjaro  15 mg weekly. II) continue Farxiga  10 mg daily. III) increase Humulin  R U-500 insulin  150 - 130 - 150 to 160-130-160 units with meals 3 times a day.  Advised to adjust +/- 10 units based on his blood sugar before eating.  Patient is asked to take insulin  preferably 30 minutes before eating.  - Home glucose testing: Continue Dexcom G7 CGM and check as needed.  - Discussed/ Gave Hypoglycemia treatment plan.  Glucagon  Emergency Kit prescribed.  Advised to use glucose tablets for correction of hypoglycemia.  Discussed that patient is on high-dose of concentrated insulin  and has risks of having hypoglycemia.  # Consult : not required at this time.   # Annual urine for microalbuminuria/ creatinine ratio, no microalbuminuria currently, continue ACE/ARB/lisinopril . Last  Lab Results  Component Value Date   MICRALBCREAT 13 05/15/2023    # Foot check nightly.  # Annual dilated diabetic eye exams.   - Diet: Make healthy diabetic food choices - Life style / activity / exercise: Discussed.  2. Blood pressure  -  BP Readings from Last 1 Encounters:  12/25/23 130/60    - Control is in target.  - No change in current plans.  3. Lipid status / Hyperlipidemia - Last  Lab Results  Component Value Date   [facility]  11/18/2023     Comment:     . LDL cholesterol not calculated. Triglyceride levels greater than 400 mg/dL invalidate calculated LDL results. . Reference range: <100 . Desirable range <100 mg/dL for primary prevention;   <70 mg/dL for patients with CHD or diabetic patients  with > or = 2 CHD risk factors. Aaron Aas LDL-C is now calculated using the Martin-Hopkins  calculation, which is a validated novel method providing  better accuracy than the Friedewald equation in the  estimation of LDL-C.  Melinda Sprawls et al. Erroll Heard. 4259;563(87):  2061-2068  (http://education.[facility].com/faq/FAQ164)    - Continue rosuvastatin  5 mg daily.  Managed by primary care provider.  Diagnoses and all orders for this visit:  Uncontrolled type 2 diabetes mellitus with hyperglycemia, with long-term current use of insulin  (HCC) -     POCT glycosylated hemoglobin (Hb A1C)  Type 2 diabetes mellitus without complication, unspecified whether long term insulin  use (HCC) -     POCT glycosylated hemoglobin (Hb A1C)    DISPOSITION Follow up in clinic in 3 months suggested.   All questions answered and patient verbalized understanding of the plan.  [place] Sadie Hazelett, MD [facility] Endocrinology [facility] [address] [place], [address] [place], [place] [address] Phone # [phone]  At least part of this note was generated using voice recognition software. Inadvertent word errors may have occurred, which were not recognized during the proofreading process.

## 2023-12-25 NOTE — Patient Instructions (Addendum)
 continue Mounjaro  15 mg weekly. continue Farxiga  10 mg daily. continue Humulin  R U-500 insulin  160 - 130 - 160 units with meals 3 times a day.  Advised to adjust +/- 10 units based on his blood sugar before eating. Take 30 minutes before eating.   Latest Reference Range & Units 06/12/23 14:34 09/22/23 15:59 12/25/23 08:45  Hemoglobin A1C 4.0 - 5.6 % 8.5 ! Pend 7.9 ! Pend 7.9 !  !: Data is abnormal

## 2023-12-26 ENCOUNTER — Other Ambulatory Visit: Payer: Self-pay | Admitting: Endocrinology

## 2023-12-26 ENCOUNTER — Other Ambulatory Visit: Payer: Self-pay

## 2023-12-26 ENCOUNTER — Other Ambulatory Visit (HOSPITAL_COMMUNITY): Payer: Self-pay

## 2023-12-26 DIAGNOSIS — Z794 Long term (current) use of insulin: Secondary | ICD-10-CM

## 2023-12-26 MED ORDER — HUMULIN R U-500 KWIKPEN 500 UNIT/ML ~~LOC~~ SOPN
PEN_INJECTOR | SUBCUTANEOUS | 3 refills | Status: DC
  Filled 2023-12-26: qty 48, 56d supply, fill #0
  Filled 2024-02-11: qty 18, 20d supply, fill #1
  Filled 2024-02-11: qty 48, 56d supply, fill #1
  Filled 2024-02-11: qty 30, 34d supply, fill #1
  Filled 2024-03-30: qty 48, 56d supply, fill #2
  Filled 2024-06-22: qty 48, 56d supply, fill #3

## 2023-12-28 ENCOUNTER — Other Ambulatory Visit: Payer: Self-pay | Admitting: Orthopedic Surgery

## 2023-12-29 ENCOUNTER — Other Ambulatory Visit: Payer: Self-pay

## 2023-12-29 ENCOUNTER — Other Ambulatory Visit (HOSPITAL_COMMUNITY): Payer: Self-pay

## 2023-12-29 MED ORDER — INSUPEN PEN NEEDLES 32G X 4 MM MISC
1.0000 | Freq: Three times a day (TID) | 10 refills | Status: AC
  Filled 2024-01-05 – 2024-01-16 (×2): qty 100, 30d supply, fill #0
  Filled 2024-02-11: qty 100, 30d supply, fill #1
  Filled 2024-03-15: qty 100, 30d supply, fill #2
  Filled 2024-04-12 – 2024-04-30 (×2): qty 100, 30d supply, fill #3
  Filled 2024-05-28: qty 100, 30d supply, fill #4
  Filled 2024-06-27 – 2024-06-28 (×2): qty 100, 30d supply, fill #5
  Filled 2024-07-29: qty 100, 30d supply, fill #6
  Filled 2024-08-27: qty 100, 30d supply, fill #7

## 2024-01-02 ENCOUNTER — Other Ambulatory Visit: Payer: Self-pay

## 2024-01-05 ENCOUNTER — Other Ambulatory Visit (HOSPITAL_COMMUNITY): Payer: Self-pay

## 2024-01-15 ENCOUNTER — Other Ambulatory Visit (HOSPITAL_COMMUNITY): Payer: Self-pay

## 2024-01-16 ENCOUNTER — Other Ambulatory Visit (HOSPITAL_COMMUNITY): Payer: Self-pay

## 2024-01-22 ENCOUNTER — Encounter: Payer: Self-pay | Admitting: Cardiology

## 2024-01-22 ENCOUNTER — Ambulatory Visit: Attending: Cardiology | Admitting: Cardiology

## 2024-01-22 VITALS — BP 108/64 | HR 68 | Ht 69.0 in | Wt 248.0 lb

## 2024-01-22 DIAGNOSIS — E782 Mixed hyperlipidemia: Secondary | ICD-10-CM

## 2024-01-22 DIAGNOSIS — E1165 Type 2 diabetes mellitus with hyperglycemia: Secondary | ICD-10-CM | POA: Diagnosis not present

## 2024-01-22 DIAGNOSIS — I10 Essential (primary) hypertension: Secondary | ICD-10-CM

## 2024-01-22 NOTE — Progress Notes (Signed)
 Cardiology Office Note:  .   Date:  01/22/2024  ID:  Larry Rose, DOB Jun 09, 1966, MRN [id number] PCP: Arnetha Bhat, NP  [facility] Providers Cardiologist:  Dorothye Gathers, MD     History of Present Illness: .   Larry Rose is a 58 y.o. male Discussed the use of AI scribe software for clinical note transcription with the patient, who gave verbal consent to proceed.  History of Present Illness Larry Rose "Larry Rose" is a 58 year old male with type 2 diabetes and coronary artery disease who presents for follow-up of an abnormal EKG, abnormal coronary CT scan with mild to moderate stenosis but no flow limitation, diabetes with markedly elevated triglycerides.  A coronary CT performed on May 01, 2023, showed no mild to moderate stenosis, revealed a calcium  score of 201.   He has type 2 diabetes and is currently taking Farxiga  10 mg daily. For hypertension and renal protection, he takes lisinopril  20 mg daily. For hyperlipidemia, he is on Crestor  (rosuvastatin ) 10 mg daily. He previously took fenofibrate  145 mg but has discontinued it. His triglycerides were elevated at 716 mg/dL, and he is taking fish oil supplements (Lovaza ).  He recently stopped drinking alcohol, which he believes will help with his triglyceride levels. 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 He previously worked at [employer] for ten years.      Studies Reviewed: Aaron Aas    EKG Interpretation Date/Time:  Thursday January 22 2024 09:31:39 EDT Ventricular Rate:  68 PR Interval:  200 QRS Duration:  128 QT Interval:  388 QTC Calculation: 412 R Axis:   -53  Text Interpretation: Normal sinus rhythm Left axis deviation Non-specific intra-ventricular conduction block Poor R wave progression Minimal voltage criteria for LVH, may be normal variant ( Cornell product ) When compared with ECG of 17-Apr-2023 08:50, PR interval has decreased  Confirmed by Dorothye Gathers ([id number]) on 01/22/2024 9:57:23 AM    Results LABS Triglycerides: 716  RADIOLOGY Coronary CT: No significant stenosis. Calcium  score 201. Mild to moderate disease mostly in the circumflex system. (05/01/2023)  DIAGNOSTIC EKG: Abnormal  Risk Assessment/Calculations:            Physical Exam:   VS:  BP 108/64 (BP Location: Left Arm, Patient Position: Sitting, Cuff Size: Normal)   Pulse 68   Ht 5\' 9"  (1.753 m)   Wt 248 lb (112.5 kg)   SpO2 97%   BMI 36.62 kg/m    Wt Readings from Last 3 Encounters:  01/22/24 248 lb (112.5 kg)  12/25/23 242 lb 9.6 oz (110 kg)  11/13/23 (!) 301 lb 12.8 oz (136.9 kg)    GEN: Well nourished, well developed in no acute distress NECK: No JVD; No carotid bruits CARDIAC: RRR, no murmurs, no rubs, no gallops RESPIRATORY:  Clear to auscultation without rales, wheezing or rhonchi  ABDOMEN: Soft, non-tender, non-distended EXTREMITIES:  No edema; No deformity   ASSESSMENT AND PLAN: .    Assessment and Plan Assessment & Plan Coronary Artery Disease Coronary artery disease with mild to moderate plaque multivessel but worse in the circumflex system. Calcium  score of 201, 89th percentile for age, indicating higher than average plaque burden. No significant stenosis on coronary CT.  Continuing current medications to manage plaque and prevent progression is beneficial. - Continue rosuvastatin  10 mg daily.  LDL goal less than 70 - Continue Lovaza .  He has not been  taking fibrate. - Consider starting low-dose aspirin 81 mg daily, discussing potential side effects such as bleeding and bruising.  Hyperlipidemia Hyperlipidemia with elevated triglycerides previously at 716 mg/dL. Discontinued fenofibrate , focusing on lifestyle modifications including alcohol cessation and weight management as well as fish oil. Potential need for further intervention if triglycerides remain elevated. - Continue rosuvastatin  10 mg daily. - Consider referral  to lipid clinic if triglycerides remain elevated despite lifestyle changes.  Type 2 Diabetes Mellitus Type 2 diabetes mellitus managed with Farxiga  10 mg. Weight management efforts include Mounjaro , with gastrointestinal side effects. Emphasized lifestyle modifications for managing diabetes and associated cardiovascular risks.  Hypertension Hypertension managed with lisinopril  20 mg daily.         Dispo: 1 yr  Signed, Dorothye Gathers, MD

## 2024-01-22 NOTE — Patient Instructions (Signed)

## 2024-01-29 ENCOUNTER — Other Ambulatory Visit: Payer: Self-pay | Admitting: Orthopedic Surgery

## 2024-01-29 ENCOUNTER — Other Ambulatory Visit (HOSPITAL_COMMUNITY): Payer: Self-pay

## 2024-01-29 ENCOUNTER — Other Ambulatory Visit: Payer: Self-pay

## 2024-01-29 DIAGNOSIS — I10 Essential (primary) hypertension: Secondary | ICD-10-CM

## 2024-01-29 MED ORDER — LISINOPRIL 20 MG PO TABS
20.0000 mg | ORAL_TABLET | Freq: Every day | ORAL | 1 refills | Status: DC
  Filled 2024-01-29: qty 90, 90d supply, fill #0
  Filled 2024-04-29: qty 90, 90d supply, fill #1

## 2024-02-11 ENCOUNTER — Other Ambulatory Visit (HOSPITAL_COMMUNITY): Payer: Self-pay

## 2024-02-11 ENCOUNTER — Other Ambulatory Visit: Payer: Self-pay

## 2024-02-12 ENCOUNTER — Other Ambulatory Visit (HOSPITAL_COMMUNITY): Payer: Self-pay

## 2024-02-19 ENCOUNTER — Other Ambulatory Visit (HOSPITAL_COMMUNITY): Payer: Self-pay

## 2024-03-03 ENCOUNTER — Other Ambulatory Visit: Payer: Self-pay | Admitting: Endocrinology

## 2024-03-03 ENCOUNTER — Other Ambulatory Visit (HOSPITAL_COMMUNITY): Payer: Self-pay

## 2024-03-03 ENCOUNTER — Other Ambulatory Visit: Payer: Self-pay | Admitting: Orthopedic Surgery

## 2024-03-03 ENCOUNTER — Encounter (HOSPITAL_COMMUNITY): Payer: Self-pay

## 2024-03-03 ENCOUNTER — Other Ambulatory Visit: Payer: Self-pay

## 2024-03-03 DIAGNOSIS — I10 Essential (primary) hypertension: Secondary | ICD-10-CM

## 2024-03-03 DIAGNOSIS — E1165 Type 2 diabetes mellitus with hyperglycemia: Secondary | ICD-10-CM

## 2024-03-03 MED ORDER — DAPAGLIFLOZIN PROPANEDIOL 10 MG PO TABS
10.0000 mg | ORAL_TABLET | Freq: Every day | ORAL | 5 refills | Status: DC
  Filled 2024-03-03: qty 30, 30d supply, fill #0
  Filled 2024-03-30: qty 30, 30d supply, fill #1
  Filled 2024-04-30: qty 30, 30d supply, fill #2
  Filled 2024-05-28: qty 30, 30d supply, fill #3
  Filled 2024-06-27: qty 30, 30d supply, fill #4
  Filled 2024-07-26: qty 30, 30d supply, fill #5

## 2024-03-03 MED ORDER — FUROSEMIDE 20 MG PO TABS
20.0000 mg | ORAL_TABLET | Freq: Every day | ORAL | 1 refills | Status: DC | PRN
  Filled 2024-03-03: qty 90, 90d supply, fill #0
  Filled 2024-06-07: qty 90, 90d supply, fill #1

## 2024-03-03 NOTE — Telephone Encounter (Signed)
 Patient is requesting more Lasix . Medication pend and sent to PCP [name], NP for approval.

## 2024-03-15 ENCOUNTER — Other Ambulatory Visit (HOSPITAL_COMMUNITY): Payer: Self-pay

## 2024-03-18 ENCOUNTER — Other Ambulatory Visit

## 2024-03-18 DIAGNOSIS — E781 Pure hyperglyceridemia: Secondary | ICD-10-CM | POA: Diagnosis not present

## 2024-03-18 LAB — LIPID PANEL
Cholesterol: 174 mg/dL (ref <200)
HDL: 36 mg/dL — ABNORMAL LOW (ref >=40)
Non-HDL Cholesterol (Calc): 138 mg/dL — ABNORMAL HIGH (ref <130)
Total CHOL/HDL Ratio: 4.8 (calc) (ref <5.0)
Triglycerides: 438 mg/dL — ABNORMAL HIGH (ref <150)

## 2024-03-19 ENCOUNTER — Ambulatory Visit: Payer: Self-pay | Admitting: Orthopedic Surgery

## 2024-03-30 ENCOUNTER — Other Ambulatory Visit: Payer: Self-pay

## 2024-03-30 ENCOUNTER — Other Ambulatory Visit (HOSPITAL_COMMUNITY): Payer: Self-pay

## 2024-03-31 ENCOUNTER — Other Ambulatory Visit (HOSPITAL_COMMUNITY): Payer: Self-pay

## 2024-04-02 ENCOUNTER — Other Ambulatory Visit (HOSPITAL_COMMUNITY): Payer: Self-pay

## 2024-04-07 ENCOUNTER — Other Ambulatory Visit (HOSPITAL_COMMUNITY): Payer: Self-pay

## 2024-04-07 ENCOUNTER — Other Ambulatory Visit: Payer: Self-pay | Admitting: Endocrinology

## 2024-04-07 DIAGNOSIS — E1165 Type 2 diabetes mellitus with hyperglycemia: Secondary | ICD-10-CM

## 2024-04-07 MED ORDER — DEXCOM G7 SENSOR MISC
2 refills | Status: AC
  Filled 2024-04-07: qty 9, 90d supply, fill #0
  Filled 2024-04-07: qty 3, 30d supply, fill #0
  Filled 2024-07-26: qty 3, 30d supply, fill #1

## 2024-04-20 ENCOUNTER — Encounter: Payer: Self-pay | Admitting: Sports Medicine

## 2024-04-21 ENCOUNTER — Other Ambulatory Visit (HOSPITAL_COMMUNITY): Payer: Self-pay

## 2024-04-29 ENCOUNTER — Other Ambulatory Visit (HOSPITAL_COMMUNITY): Payer: Self-pay

## 2024-04-29 ENCOUNTER — Other Ambulatory Visit: Payer: Self-pay | Admitting: Interventional Cardiology

## 2024-04-30 ENCOUNTER — Other Ambulatory Visit (HOSPITAL_COMMUNITY): Payer: Self-pay

## 2024-04-30 ENCOUNTER — Other Ambulatory Visit: Payer: Self-pay

## 2024-04-30 MED ORDER — ROSUVASTATIN CALCIUM 10 MG PO TABS
10.0000 mg | ORAL_TABLET | Freq: Every day | ORAL | 3 refills | Status: AC
  Filled 2024-04-30: qty 90, 90d supply, fill #0
  Filled 2024-07-29: qty 90, 90d supply, fill #1

## 2024-05-20 ENCOUNTER — Ambulatory Visit: Admitting: Orthopedic Surgery

## 2024-05-27 ENCOUNTER — Ambulatory Visit: Admitting: Orthopedic Surgery

## 2024-05-28 ENCOUNTER — Other Ambulatory Visit (HOSPITAL_COMMUNITY): Payer: Self-pay

## 2024-06-08 ENCOUNTER — Other Ambulatory Visit (HOSPITAL_COMMUNITY): Payer: Self-pay

## 2024-06-08 ENCOUNTER — Other Ambulatory Visit: Payer: Self-pay | Admitting: Endocrinology

## 2024-06-08 MED ORDER — MOUNJARO 15 MG/0.5ML ~~LOC~~ SOAJ
15.0000 mg | SUBCUTANEOUS | 3 refills | Status: AC
  Filled 2024-06-08: qty 6, 84d supply, fill #0
  Filled 2024-08-27: qty 6, 84d supply, fill #1

## 2024-06-10 ENCOUNTER — Encounter: Payer: Self-pay | Admitting: Orthopedic Surgery

## 2024-06-10 ENCOUNTER — Other Ambulatory Visit (HOSPITAL_COMMUNITY): Payer: Self-pay

## 2024-06-10 ENCOUNTER — Ambulatory Visit: Admitting: Orthopedic Surgery

## 2024-06-10 VITALS — BP 124/70 | HR 70 | Temp 97.0°F | Resp 16 | Ht 69.0 in | Wt 296.2 lb

## 2024-06-10 DIAGNOSIS — E1169 Type 2 diabetes mellitus with other specified complication: Secondary | ICD-10-CM | POA: Diagnosis not present

## 2024-06-10 DIAGNOSIS — Z6841 Body Mass Index (BMI) 40.0 and over, adult: Secondary | ICD-10-CM

## 2024-06-10 DIAGNOSIS — E1165 Type 2 diabetes mellitus with hyperglycemia: Secondary | ICD-10-CM

## 2024-06-10 DIAGNOSIS — E781 Pure hyperglyceridemia: Secondary | ICD-10-CM | POA: Diagnosis not present

## 2024-06-10 DIAGNOSIS — E785 Hyperlipidemia, unspecified: Secondary | ICD-10-CM

## 2024-06-10 DIAGNOSIS — B354 Tinea corporis: Secondary | ICD-10-CM

## 2024-06-10 DIAGNOSIS — I10 Essential (primary) hypertension: Secondary | ICD-10-CM

## 2024-06-10 MED ORDER — CLOTRIMAZOLE 1 % EX CREA
1.0000 | TOPICAL_CREAM | Freq: Two times a day (BID) | CUTANEOUS | 0 refills | Status: AC
  Filled 2024-06-10: qty 28, 14d supply, fill #0

## 2024-06-10 NOTE — Patient Instructions (Signed)
 Try to lose 1-2 lbs a week  Try counting calories on APP, to lose weigh you need < 1800 calories per day  Apples and berries best fruit for diabetes  Cut out drinks with artificial sweeteners   Make lab visit for fasting labs > do not eat 12 hours hours before blood drawn> water and black coffee are ok to have before labs being drawn

## 2024-06-10 NOTE — Progress Notes (Signed)
 Careteam: Patient Care Team: [name], NP as PCP - General (Adult Health Nurse Practitioner) Jeffrie Oneil BROCKS, MD as PCP - Cardiology (Cardiology) Lita Lye, OD as Referring Physician (Optometry)  Seen by: Greig Gil, AGNP-C  PLACE OF SERVICE:  [facility]  Advanced Directive information    Allergies  Allergen Reactions   Enalapril Maleate    Lovastatin     Chief Complaint  Patient presents with   Follow-up    6 month follow up Patient has concern about a bruise on his stomach on the right side     HPI: Patient is a 58 y.o. male seen today for medical management of chronic conditions.   Discussed the use of AI scribe software for clinical note transcription with the patient, who gave verbal consent to proceed.  History of Present Illness   Larry Rose is a 58 year old male with diabetes and hypertension who presents for diabetes management and weight concerns.  He has experienced weight fluctuations, currently weighing 296 pounds, a decrease from 301 pounds six months ago. He is concerned about his weight nearing 300 pounds and notes a previous incorrect recording of 240 pounds. His dietary habits include attempts to eat healthily with his wife, consuming fruits like apples and bananas, and meals such as overnight oatmeal with nuts and dried fruits. Breakfast is challenging, often consisting of [employer] sausage biscuits and pancake sausage meals. He drinks diet drinks but is trying to increase water intake.  He manages his diabetes with insulin  and Mounjaro , taking 150 units of insulin  in the morning, 130 units in the afternoon, and 150 units at night. He uses a Dexcom G7 for glucose monitoring but does not wear it consistently. He experiences diarrhea a couple of days after Mounjaro  injections, which he manages with an unspecified medication.  He mentions a skin issue on his stomach that itches and has persisted for a while. He has been applying  Neosporin and using a Band-Aid, but the issue remains. He has an upcoming appointment with a dermatologist for this concern.       Review of Systems:  Review of Systems  Constitutional: Negative.   HENT: Negative.    Respiratory: Negative.    Cardiovascular: Negative.   Gastrointestinal: Negative.   Genitourinary: Negative.   Musculoskeletal: Negative.   Skin:  Positive for itching.  Neurological: Negative.   Psychiatric/Behavioral: Negative.      Past Medical History:  Diagnosis Date   ALCOHOLISM 11/30/2009   ALLERGIC RHINITIS 04/01/2007   CARPAL TUNNEL SYNDROME, RIGHT 10/06/2008   DIABETES MELLITUS, TYPE II 04/01/2007   Hepatic steatosis    HYPERLIPIDEMIA, ATHEROGENIC 09/29/2007   HYPERTENSION 04/01/2007   Rosacea 05/14/2010   TINEA CRURIS 11/07/2008   Past Surgical History:  Procedure Laterality Date   carpel tunnel     FRACTURE SURGERY     Social History:   reports that he has never smoked. He has never used smokeless tobacco. He reports current alcohol use of about 4.0 - 5.0 standard drinks of alcohol per week. He reports that he does not currently use drugs.  Family History  Problem Relation Age of Onset   Hypertension Father    Pneumonia Father    Diabetes Maternal Grandmother    Heart attack Maternal Grandfather    Stroke Paternal Grandfather    Cancer Neg Hx     Medications: Patient's Medications  New Prescriptions   CLOTRIMAZOLE  (LOTRIMIN ) 1 % CREAM    Apply 1  Application topically 2 (two) times daily for 10 days.  Previous Medications   BD PEN NEEDLE NANO 2ND GEN 32G X 4 MM MISC    USE 3 TIMES PER DAY.   COLCHICINE  (MITIGARE ) 0.6 MG CAPS    Take 1 capsule by mouth as needed.   CONTINUOUS GLUCOSE SENSOR (DEXCOM G7 SENSOR) MISC    Use to check glucose continuously, changing the sensor every 10 days.   DAPAGLIFLOZIN  PROPANEDIOL (FARXIGA ) 10 MG TABS TABLET    Take 1 tablet (10 mg total) by mouth daily before breakfast.   FUROSEMIDE  (LASIX ) 20 MG TABLET    Take  1 tablet (20 mg total) by mouth daily as needed.   INSULIN  PEN NEEDLE (INSUPEN PEN NEEDLES) 32G X 4 MM MISC    Use 3 (three) times daily.   INSULIN  REGULAR HUMAN CONCENTRATED (HUMULIN  R U-500 KWIKPEN) 500 UNIT/ML KWIKPEN    Inject 160 Units into the skin in the morning AND 130 Units daily before lunch AND 160 Units every evening.   LISINOPRIL  (ZESTRIL ) 20 MG TABLET    Take 1 tablet (20 mg total) by mouth daily.   MULTIPLE VITAMIN (MULTIVITAMIN) TABLET    Take 1 tablet by mouth daily.   OMEGA-3 ACID ETHYL ESTERS (LOVAZA ) 1 G CAPSULE    Take 1 capsule (1 g total) by mouth 2 (two) times daily.   ROSUVASTATIN  (CRESTOR ) 10 MG TABLET    Take 1 tablet (10 mg total) by mouth daily.   TIRZEPATIDE  (MOUNJARO ) 15 MG/0.5ML PEN    Inject 15 mg into the skin once a week.   TRIAMCINOLONE  CREAM (KENALOG ) 0.1 %    Apply 1 Application topically as needed.  Modified Medications   No medications on file  Discontinued Medications   GLUCAGON  (GVOKE HYPOPEN  1-PACK) 1 MG/0.2ML SOAJ    Inject 1 mg into the skin as needed (low blood sugar with impaired consciousness).    Physical Exam:  There were no vitals filed for this visit. There is no height or weight on file to calculate BMI. Wt Readings from Last 3 Encounters:  01/22/24 248 lb (112.5 kg)  12/25/23 242 lb 9.6 oz (110 kg)  11/13/23 (!) 301 lb 12.8 oz (136.9 kg)    Physical Exam Vitals reviewed.  Constitutional:      General: He is not in acute distress.    Appearance: He is obese.  HENT:     Head: Normocephalic.  Eyes:     General:        Right eye: No discharge.        Left eye: No discharge.  Cardiovascular:     Rate and Rhythm: Normal rate and regular rhythm.     Pulses: Normal pulses.     Heart sounds: Normal heart sounds.  Pulmonary:     Effort: Pulmonary effort is normal.     Breath sounds: Normal breath sounds.  Abdominal:     General: Bowel sounds are normal.     Palpations: Abdomen is soft.  Musculoskeletal:     Cervical back: Neck  supple.     Right lower leg: No edema.     Left lower leg: No edema.  Skin:    General: Skin is warm.     Capillary Refill: Capillary refill takes less than 2 seconds.     Comments: Ringworm rash to right lower abdomen  Neurological:     General: No focal deficit present.     Mental Status: He is alert and oriented to person,  place, and time.  Psychiatric:        Mood and Affect: Mood normal.     Labs reviewed: Basic Metabolic Panel: Recent Labs    11/18/23 0814  NA 135  K 5.2  CL 100  CO2 28  GLUCOSE 228*  BUN 15  CREATININE 0.70  CALCIUM  8.8   Liver Function Tests: Recent Labs    11/18/23 0814  AST 22  ALT 23  BILITOT 0.5  PROT 6.7   No results for input(s): LIPASE, AMYLASE in the last 8760 hours. No results for input(s): AMMONIA in the last 8760 hours. CBC: Recent Labs    11/18/23 0814  WBC 6.2  NEUTROABS 3,559  HGB 15.0  HCT 44.5  MCV 93.3  PLT 179   Lipid Panel: Recent Labs    11/18/23 0814 03/18/24 0811  CHOL 186 174  HDL 35* 36*  TRIG 716* 438*  CHOLHDL 5.3* 4.8   TSH: No results for input(s): TSH in the last 8760 hours. A1C: Lab Results  Component Value Date   HGBA1C 7.9 (A) 12/25/2023     Assessment/Plan 1. Type 2 diabetes mellitus with hyperglycemia, unspecified whether long term insulin  use (HCC) (Primary) - followed by endocrinology> advised to reschedule appointment - A1c 5.8 12/2023 - no hypoglycemias - cont Monjuaro and insulin  - plan to check A1c and urine microalbumin on scheduled lab visit - needs eye exam and foot exam  2. Essential hypertension - controlled with lisinopril  - CBC with Differential/Platelet; Future - Complete Metabolic Panel with eGFR; Future  3. Hyperlipidemia associated with type 2 diabetes mellitus (HCC) - cont rosuvastatin  - Lipid Panel; Future  4. Ringworm of body - clotrimazole  (LOTRIMIN ) 1 % cream; Apply 1 Application topically 2 (two) times daily for 10 days.  Dispense: 28 g;  Refill: 0  5. Morbid obesity with BMI of 45.0-49.9, adult (HCC) - BMI 43.74 - declined [facility] - discussed reducing calories and diabetic diet  6. Hypertriglyceridemia - ongoing - h/o alcohol abuse in past - declined fenofibrate   - on lovaza  - recheck levels next lab visit    Total time: 36 minutes. Greater than 50% of total time spent doing patient education regarding health maintenance, T2DM, obesity, HTN, HLD and elevated triglycerides including symptom/medication management.     Next appt: Visit date not found  Ladavia Lindenbaum Gil BODILY  [facility] [phone]

## 2024-06-15 DIAGNOSIS — L309 Dermatitis, unspecified: Secondary | ICD-10-CM | POA: Diagnosis not present

## 2024-06-15 DIAGNOSIS — L718 Other rosacea: Secondary | ICD-10-CM | POA: Diagnosis not present

## 2024-06-15 DIAGNOSIS — L570 Actinic keratosis: Secondary | ICD-10-CM | POA: Diagnosis not present

## 2024-06-22 ENCOUNTER — Other Ambulatory Visit (HOSPITAL_COMMUNITY): Payer: Self-pay

## 2024-06-23 ENCOUNTER — Ambulatory Visit (HOSPITAL_COMMUNITY)
Admission: EM | Admit: 2024-06-23 | Discharge: 2024-06-23 | Disposition: A | Discharge status: Home / Self Care | Attending: Physician Assistant | Admitting: Physician Assistant

## 2024-06-23 ENCOUNTER — Encounter (HOSPITAL_COMMUNITY): Payer: Self-pay

## 2024-06-23 ENCOUNTER — Other Ambulatory Visit (HOSPITAL_COMMUNITY): Payer: Self-pay

## 2024-06-23 DIAGNOSIS — R0989 Other specified symptoms and signs involving the circulatory and respiratory systems: Secondary | ICD-10-CM | POA: Diagnosis not present

## 2024-06-23 DIAGNOSIS — J0100 Acute maxillary sinusitis, unspecified: Secondary | ICD-10-CM | POA: Diagnosis not present

## 2024-06-23 MED ORDER — AMOXICILLIN-POT CLAVULANATE 875-125 MG PO TABS
1.0000 | ORAL_TABLET | Freq: Two times a day (BID) | ORAL | 0 refills | Status: DC
  Filled 2024-06-23: qty 14, 7d supply, fill #0

## 2024-06-23 NOTE — ED Triage Notes (Signed)
 Patient here today with c/o nasal congestion, left side sinus pressure, ST, and productive cough X 3 days. He has taken Mucinex DM with some relief. His wife recently had a sinus infection. Patient had his flu vaccine on 06/18/2024.

## 2024-06-23 NOTE — Discharge Instructions (Addendum)
 VISIT SUMMARY:  You came in today with symptoms of sinusitis, including facial pressure, cough, and nasal congestion. You have been experiencing these symptoms for about a week and a half, and they have been affecting your daily life.  YOUR PLAN:  -ACUTE SINUSITIS: Acute sinusitis is an infection or inflammation of the sinuses, which can cause facial pressure, pain around the teeth, and nasal congestion. You have been prescribed Augmentin to take twice daily for seven days to treat the infection. You should stop using Afrin nasal spray and switch to Flonase for nasal congestion. Continue taking Mucinex to help with your symptoms. Start taking Augmentin tonight and take the morning dose after your blood work to avoid taking it on an empty stomach.  INSTRUCTIONS:  Please follow up if your symptoms do not improve or if they worsen. Make sure to take the Augmentin as prescribed and follow the instructions for Flonase and Mucinex. If you experience any side effects or have any concerns, contact our office.

## 2024-06-23 NOTE — ED Provider Notes (Signed)
 [facility]    CSN: [id number] Arrival date & time: 06/23/24  1537      History   Chief Complaint Chief Complaint  Patient presents with   Nasal Congestion    HPI Larry Rose is a 58 y.o. male.  has a past medical history of ALCOHOLISM (11/30/2009), ALLERGIC RHINITIS (04/01/2007), CARPAL TUNNEL SYNDROME, RIGHT (10/06/2008), DIABETES MELLITUS, TYPE II (04/01/2007), Hepatic steatosis, HYPERLIPIDEMIA, ATHEROGENIC (09/29/2007), HYPERTENSION (04/01/2007), Rosacea (05/14/2010), and TINEA CRURIS (11/07/2008).   HPI  Discussed the use of AI scribe software for clinical note transcription with the patient, who gave verbal consent to proceed.  The patient presents with symptoms suggestive of sinusitis, including facial pressure and cough.  He has been experiencing significant facial pressure, particularly around the sinus area, extending to the ear and around the teeth. The pain is described as 'sore' and feels like it is affecting his teeth. This has been ongoing for at least a week and a half.  He has been coughing up dark green sputum and reports post-nasal drip. He has been using Afrin nasal spray for about a week and Mucinex D (12-hour) for three days to manage his symptoms. He initially used Sudafed before switching to Mucinex. Nasal congestion is present with a tendency for the nose to run.  Current medications include enalapril and lovastatin. He is unsure about any allergies to antibiotics.  No fever, chills, shortness of breath, wheezing, nausea, vomiting, or diarrhea.   Past Medical History:  Diagnosis Date   ALCOHOLISM 11/30/2009   ALLERGIC RHINITIS 04/01/2007   CARPAL TUNNEL SYNDROME, RIGHT 10/06/2008   DIABETES MELLITUS, TYPE II 04/01/2007   Hepatic steatosis    HYPERLIPIDEMIA, ATHEROGENIC 09/29/2007   HYPERTENSION 04/01/2007   Rosacea 05/14/2010   TINEA CRURIS 11/07/2008    Patient Active Problem List   Diagnosis Date Noted   Hemorrhoids 06/29/2020    Radiculitis of right cervical region 02/04/2020   Localized, primary osteoarthritis of shoulder region, right 12/28/2019   Gout 08/25/2018   Wellness examination 04/24/2016   Dyspnea 04/24/2016   Pain in joint, ankle and foot 11/25/2014   Finger pain, right 11/25/2014   Diabetes (HCC) 06/21/2014   Routine general medical examination at a health care facility 11/17/2013   Encounter for long-term (current) use of other medications 12/17/2012   Hyposmolality and/or hyponatremia 05/21/2011   Conductive hearing loss, unilateral 05/21/2011   ROSACEA 05/14/2010   Alcohol use disorder, mild, abuse 11/30/2009   CARPAL TUNNEL SYNDROME, RIGHT 10/06/2008   Dyslipidemia 09/29/2007   Essential hypertension 04/01/2007   Allergic rhinitis 04/01/2007    Past Surgical History:  Procedure Laterality Date   carpel tunnel     FRACTURE SURGERY         Home Medications    Prior to Admission medications   Medication Sig Start Date End Date Taking? Authorizing Provider  amoxicillin-clavulanate (AUGMENTIN) 875-125 MG tablet Take 1 tablet by mouth every 12 (twelve) hours. 06/23/24  Yes Toddy Boyd E, PA-C  BD PEN NEEDLE NANO 2ND GEN 32G X 4 MM MISC USE 3 TIMES PER DAY. 12/07/21   Kassie Mallick, MD  clotrimazole  (LOTRIMIN ) 1 % cream Apply 1 Application topically 2 (two) times daily for 10 days. 06/10/24 06/24/24  Fargo, Amy E, NP  Colchicine  (MITIGARE ) 0.6 MG CAPS Take 1 capsule by mouth as needed.    [provider]  Continuous Glucose Sensor (DEXCOM G7 SENSOR) MISC Use to check glucose continuously, changing the sensor every 10 days. 04/07/24   Thapa, Sudan,  MD  dapagliflozin  propanediol (FARXIGA ) 10 MG TABS tablet Take 1 tablet (10 mg total) by mouth daily before breakfast. 03/03/24   Thapa, Sudan, MD  furosemide  (LASIX ) 20 MG tablet Take 1 tablet (20 mg total) by mouth daily as needed. 03/03/24   Fargo, Amy E, NP  Insulin  Pen Needle (INSUPEN PEN NEEDLES) 32G X 4 MM MISC Use 3 (three) times daily.  12/29/23   Fargo, Amy E, NP  insulin  regular human CONCENTRATED (HUMULIN  R U-500 KWIKPEN) 500 UNIT/ML KwikPen Inject 160 Units into the skin in the morning AND 130 Units daily before lunch AND 160 Units every evening. 12/26/23   Thapa, Sudan, MD  lisinopril  (ZESTRIL ) 20 MG tablet Take 1 tablet (20 mg total) by mouth daily. 01/29/24   [name], NP  Multiple Vitamin (MULTIVITAMIN) tablet Take 1 tablet by mouth daily.    [provider]  omega-3 acid ethyl esters (LOVAZA ) 1 g capsule Take 1 capsule (1 g total) by mouth 2 (two) times daily. 11/19/23   Fargo, Amy E, NP  rosuvastatin  (CRESTOR ) 10 MG tablet Take 1 tablet (10 mg total) by mouth daily. 04/30/24   Jeffrie Oneil BROCKS, MD  tirzepatide  (MOUNJARO ) 15 MG/0.5ML Pen Inject 15 mg into the skin once a week. 06/08/24   Thapa, Sudan, MD  triamcinolone  cream (KENALOG ) 0.1 % Apply 1 Application topically as needed.    [provider]    Family History Family History  Problem Relation Age of Onset   Hypertension Father    Pneumonia Father    Diabetes Maternal Grandmother    Heart attack Maternal Grandfather    Stroke Paternal Grandfather    Cancer Neg Hx     Social History Social History   Tobacco Use   Smoking status: Never   Smokeless tobacco: Never  Vaping Use   Vaping status: Never Used  Substance Use Topics   Alcohol use: Yes    Alcohol/week: 4.0 - 5.0 standard drinks of alcohol    Types: 4 - 5 Standard drinks or equivalent per week   Drug use: Not Currently     Allergies   Enalapril maleate and Lovastatin   Review of Systems Review of Systems  Constitutional:  Negative for chills and fever.  HENT:  Positive for ear pain, postnasal drip, rhinorrhea, sinus pressure and sinus pain. Negative for congestion.   Respiratory:  Positive for cough. Negative for shortness of breath and wheezing.   Gastrointestinal:  Negative for diarrhea, nausea and vomiting.     Physical Exam Triage Vital Signs ED Triage Vitals   Encounter Vitals Group     BP 06/23/24 1732 127/71     Girls Systolic BP Percentile --      Girls Diastolic BP Percentile --      Boys Systolic BP Percentile --      Boys Diastolic BP Percentile --      Pulse Rate 06/23/24 1732 70     Resp 06/23/24 1732 16     Temp 06/23/24 1732 97.7 F (36.5 C)     Temp Source 06/23/24 1732 Oral     SpO2 06/23/24 1732 95 %     Weight --      Height --      Head Circumference --      Peak Flow --      Pain Score 06/23/24 1731 4     Pain Loc --      Pain Education --      Exclude from [employer]  Chart --    No data found.  Updated Vital Signs BP 127/71 (BP Location: Left Arm)   Pulse 70   Temp 97.7 F (36.5 C) (Oral)   Resp 16   SpO2 95%   Visual Acuity Right Eye Distance:   Left Eye Distance:   Bilateral Distance:    Right Eye Near:   Left Eye Near:    Bilateral Near:     Physical Exam Vitals reviewed.  Constitutional:      General: He is awake. He is not in acute distress.    Appearance: Normal appearance. He is well-developed and well-groomed. He is not ill-appearing, toxic-appearing or diaphoretic.  HENT:     Head: Normocephalic and atraumatic.     Right Ear: Hearing, tympanic membrane and ear canal normal.     Left Ear: Hearing, tympanic membrane and ear canal normal.     Mouth/Throat:     Lips: Pink.     Mouth: Mucous membranes are moist.     Pharynx: Oropharynx is clear. Uvula midline. No pharyngeal swelling, oropharyngeal exudate, posterior oropharyngeal erythema, uvula swelling or postnasal drip.     Tonsils: No tonsillar exudate or tonsillar abscesses.  Eyes:     General: Lids are normal. Gaze aligned appropriately.     Extraocular Movements: Extraocular movements intact.  Cardiovascular:     Rate and Rhythm: Normal rate and regular rhythm.     Heart sounds: Normal heart sounds.  Pulmonary:     Effort: Pulmonary effort is normal.     Breath sounds: Normal breath sounds. No decreased air movement. No decreased  breath sounds, wheezing, rhonchi or rales.  Musculoskeletal:     Cervical back: Normal range of motion and neck supple.  Lymphadenopathy:     Head:     Right side of head: No submental, submandibular or preauricular adenopathy.     Left side of head: No submental, submandibular or preauricular adenopathy.     Cervical:     Right cervical: No superficial cervical adenopathy.    Left cervical: No superficial cervical adenopathy.     Upper Body:     Right upper body: No supraclavicular adenopathy.     Left upper body: No supraclavicular adenopathy.  Skin:    General: Skin is warm and dry.  Neurological:     General: No focal deficit present.     Mental Status: He is alert and oriented to person, place, and time.  Psychiatric:        Mood and Affect: Mood normal.        Behavior: Behavior normal. Behavior is cooperative.        Thought Content: Thought content normal.        Judgment: Judgment normal.      UC Treatments / Results  Labs (all labs ordered are listed, but only abnormal results are displayed) Labs Reviewed - No data to display   EKG   Radiology No results found.  Procedures Procedures (including critical care time)  Medications Ordered in UC Medications - No data to display  Initial Impression / Assessment and Plan / UC Course  I have reviewed the triage vital signs and the nursing notes.  Pertinent labs & imaging results that were available during my care of the patient were reviewed by me and considered in my medical decision making (see chart for details).      Final Clinical Impressions(s) / UC Diagnoses   Final diagnoses:  Symptoms of upper respiratory infection (URI)  Acute  maxillary sinusitis, recurrence not specified    Acute sinusitis Symptoms persisting for at least a week and a half, including facial pressure, soreness around the teeth, and dark green sputum production. No fever, chills, or shortness of breath. Afrin use for more than  three days may have contributed to symptom persistence. - Prescribed Augmentin, to be taken twice daily for seven days. - Advised discontinuation of Afrin and switch to Flonase for nasal congestion. - Continue Mucinex for symptom relief. - Advised starting Augmentin tonight and taking the morning dose after blood work to avoid taking it on an empty stomach.    Discharge Instructions      VISIT SUMMARY:  You came in today with symptoms of sinusitis, including facial pressure, cough, and nasal congestion. You have been experiencing these symptoms for about a week and a half, and they have been affecting your daily life.  YOUR PLAN:  -ACUTE SINUSITIS: Acute sinusitis is an infection or inflammation of the sinuses, which can cause facial pressure, pain around the teeth, and nasal congestion. You have been prescribed Augmentin to take twice daily for seven days to treat the infection. You should stop using Afrin nasal spray and switch to Flonase for nasal congestion. Continue taking Mucinex to help with your symptoms. Start taking Augmentin tonight and take the morning dose after your blood work to avoid taking it on an empty stomach.  INSTRUCTIONS:  Please follow up if your symptoms do not improve or if they worsen. Make sure to take the Augmentin as prescribed and follow the instructions for Flonase and Mucinex. If you experience any side effects or have any concerns, contact our office.     ED Prescriptions     Medication Sig Dispense Auth. Provider   amoxicillin-clavulanate (AUGMENTIN) 875-125 MG tablet Take 1 tablet by mouth every 12 (twelve) hours. 14 tablet Delfin Squillace E, PA-C      PDMP not reviewed this encounter.   Marylene Rocky BRAVO, PA-C 06/23/24 [id number]

## 2024-06-24 ENCOUNTER — Other Ambulatory Visit

## 2024-06-24 DIAGNOSIS — E1165 Type 2 diabetes mellitus with hyperglycemia: Secondary | ICD-10-CM

## 2024-06-24 DIAGNOSIS — E1169 Type 2 diabetes mellitus with other specified complication: Secondary | ICD-10-CM | POA: Diagnosis not present

## 2024-06-24 DIAGNOSIS — I10 Essential (primary) hypertension: Secondary | ICD-10-CM

## 2024-06-24 DIAGNOSIS — E785 Hyperlipidemia, unspecified: Secondary | ICD-10-CM | POA: Diagnosis not present

## 2024-06-25 ENCOUNTER — Ambulatory Visit: Payer: Self-pay | Admitting: Orthopedic Surgery

## 2024-06-25 LAB — HEMOGLOBIN A1C
Hgb A1c MFr Bld: 7.1 % — ABNORMAL HIGH (ref <5.7)
Mean Plasma Glucose: 157 mg/dL
eAG (mmol/L): 8.7 mmol/L

## 2024-06-25 LAB — COMPREHENSIVE METABOLIC PANEL WITH GFR
AG Ratio: 1.7 (calc) (ref 1.0–2.5)
ALT: 22 U/L (ref 9–46)
AST: 20 U/L (ref 10–35)
Albumin: 4.3 g/dL (ref 3.6–5.1)
Alkaline phosphatase (APISO): 50 U/L (ref 35–144)
BUN: 14 mg/dL (ref 7–25)
CO2: 29 mmol/L (ref 20–32)
Calcium: 9 mg/dL (ref 8.6–10.3)
Chloride: 100 mmol/L (ref 98–110)
Creat: 0.72 mg/dL (ref 0.70–1.30)
Globulin: 2.5 g/dL (ref 1.9–3.7)
Glucose, Bld: 145 mg/dL — ABNORMAL HIGH (ref 65–99)
Potassium: 5 mmol/L (ref 3.5–5.3)
Sodium: 137 mmol/L (ref 135–146)
Total Bilirubin: 0.5 mg/dL (ref 0.2–1.2)
Total Protein: 6.8 g/dL (ref 6.1–8.1)
eGFR: 108 mL/min/1.73m2 (ref >=60)

## 2024-06-25 LAB — LIPID PANEL
Cholesterol: 155 mg/dL (ref <200)
HDL: 37 mg/dL — ABNORMAL LOW (ref >=40)
LDL Cholesterol (Calc): 87 mg/dL
Non-HDL Cholesterol (Calc): 118 mg/dL (ref <130)
Total CHOL/HDL Ratio: 4.2 (calc) (ref <5.0)
Triglycerides: 211 mg/dL — ABNORMAL HIGH (ref <150)

## 2024-06-25 LAB — CBC WITH DIFFERENTIAL/PLATELET
Absolute Lymphocytes: 2036 {cells}/uL (ref 850–3900)
Absolute Monocytes: 741 {cells}/uL (ref 200–950)
Basophils Absolute: 23 {cells}/uL (ref 0–200)
Basophils Relative: 0.3 %
Eosinophils Absolute: 242 {cells}/uL (ref 15–500)
Eosinophils Relative: 3.1 %
HCT: 46.7 % (ref 38.5–50.0)
Hemoglobin: 15.5 g/dL (ref 13.2–17.1)
MCH: 31 pg (ref 27.0–33.0)
MCHC: 33.2 g/dL (ref 32.0–36.0)
MCV: 93.4 fL (ref 80.0–100.0)
MPV: 11.2 fL (ref 7.5–12.5)
Monocytes Relative: 9.5 %
Neutro Abs: 4758 {cells}/uL (ref 1500–7800)
Neutrophils Relative %: 61 %
Platelets: 203 Thousand/uL (ref 140–400)
RBC: 5 Million/uL (ref 4.20–5.80)
RDW: 13.4 % (ref 11.0–15.0)
Total Lymphocyte: 26.1 %
WBC: 7.8 Thousand/uL (ref 3.8–10.8)

## 2024-06-25 LAB — MICROALBUMIN / CREATININE URINE RATIO
Creatinine, Urine: 27 mg/dL (ref 20–320)
Microalb Creat Ratio: 7 mg/g{creat} (ref <30)
Microalb, Ur: 0.2 mg/dL

## 2024-06-28 ENCOUNTER — Other Ambulatory Visit (HOSPITAL_COMMUNITY): Payer: Self-pay

## 2024-06-29 ENCOUNTER — Other Ambulatory Visit: Payer: Self-pay

## 2024-07-26 ENCOUNTER — Other Ambulatory Visit (HOSPITAL_COMMUNITY): Payer: Self-pay

## 2024-07-26 ENCOUNTER — Other Ambulatory Visit: Payer: Self-pay | Admitting: Orthopedic Surgery

## 2024-07-26 ENCOUNTER — Other Ambulatory Visit: Payer: Self-pay

## 2024-07-26 ENCOUNTER — Other Ambulatory Visit: Payer: Self-pay | Admitting: Endocrinology

## 2024-07-26 DIAGNOSIS — I10 Essential (primary) hypertension: Secondary | ICD-10-CM

## 2024-07-26 DIAGNOSIS — E1165 Type 2 diabetes mellitus with hyperglycemia: Secondary | ICD-10-CM

## 2024-07-26 MED ORDER — LISINOPRIL 20 MG PO TABS
20.0000 mg | ORAL_TABLET | Freq: Every day | ORAL | 1 refills | Status: AC
  Filled 2024-07-26: qty 90, 90d supply, fill #0

## 2024-07-26 MED ORDER — HUMULIN R U-500 KWIKPEN 500 UNIT/ML ~~LOC~~ SOPN
PEN_INJECTOR | SUBCUTANEOUS | 3 refills | Status: AC
  Filled 2024-07-26 – 2024-08-10 (×4): qty 48, 54d supply, fill #0
  Filled 2024-08-10: qty 48, 56d supply, fill #0

## 2024-07-30 ENCOUNTER — Other Ambulatory Visit (HOSPITAL_COMMUNITY): Payer: Self-pay

## 2024-08-02 ENCOUNTER — Ambulatory Visit: Payer: Self-pay

## 2024-08-02 ENCOUNTER — Other Ambulatory Visit: Payer: Self-pay

## 2024-08-02 ENCOUNTER — Other Ambulatory Visit (HOSPITAL_COMMUNITY): Payer: Self-pay

## 2024-08-02 ENCOUNTER — Encounter: Payer: Self-pay | Admitting: Orthopedic Surgery

## 2024-08-02 NOTE — Telephone Encounter (Signed)
 This RN made first attempt to contact pt with no answer. A voicemail was left with call back number provided.    Copied from CRM #[id number]. Topic: Clinical - Red Word Triage >> Aug 02, 2024  8:34 AM Larry Rose wrote: Red Word that prompted transfer to Nurse Triage: RT pain in neck/7/10 pain/Nerve spasm pain/Pain to arm/Can not do daily activity's at work/for 1 month but getting worse

## 2024-08-02 NOTE — Telephone Encounter (Signed)
 FYI Only or Action Required?: FYI only for provider: appointment scheduled on 08/05/24.  Patient was last seen in primary care on 06/10/2024 by Larry Greig BRAVO, NP.  Called Nurse Triage reporting Neck Pain.  Symptoms began about a month ago.  Interventions attempted: OTC medications: Tylenol  8 hour Arthritis strength.  Symptoms are: gradually worsening.  Triage Disposition: See PCP When Office is Open (Within 3 Days)  Patient/caregiver understands and will follow disposition?: Yes   Copied from CRM #[id number]. Topic: Clinical - Red Word Triage >> Aug 02, 2024  8:34 AM Miquel SAILOR wrote: Red Word that prompted transfer to Nurse Triage: RT pain in neck/7/10 pain/Nerve spasm pain/Pain to arm/Can not do daily activity's at work/for 1 month but getting worse       Yes Reason for Disposition  [1] MODERATE neck pain (e.g., interferes with normal activities) AND [2] present > 3 days  Answer Assessment - Initial Assessment Questions Patient states his pain is on the right side neck down to shoulder and arm, feels like a cramp. Hurts up around neck area. Had shots put in the middle of shoulder blades a couple of years ago, maybe a steroid shot, by Dr. ONEIDA in [place]. He says he may need it again, not sure, pain getting worse over the past 2 weeks. Advised OV first available with PCP on 08/05/24, he says he will take that since he's off work.    1. ONSET: When did the pain begin?      For about a month, worse last couple of weeks  2. LOCATION: Where does it hurt?      Right side of neck  3. PATTERN Does the pain come and go, or has it been constant since it started?      Comes and goes  4. SEVERITY: How bad is the pain?  (Scale 0-10; or none or slight stiffness, mild, moderate, severe)     6-7 with activity and working  5. RADIATION: Does the pain go anywhere else, shoot into your arms?     Down the middle of the right shoulder  6. CORD SYMPTOMS: Any weakness or numbness of  the arms or legs?     No  7. CAUSE: What do you think is causing the neck pain?     Possibly DJD  8. NECK OVERUSE: Any recent activities that involved turning or twisting the neck?     No  9. OTHER SYMPTOMS: Do you have any other symptoms? (e.g., headache, fever, chest pain, difficulty breathing, neck swelling)     No  Protocols used: Neck Pain or Stiffness-A-AH

## 2024-08-02 NOTE — Telephone Encounter (Signed)
 Noted. Patient is scheduled 08/05/24

## 2024-08-05 ENCOUNTER — Other Ambulatory Visit: Payer: Self-pay

## 2024-08-05 ENCOUNTER — Encounter: Payer: Self-pay | Admitting: Orthopedic Surgery

## 2024-08-05 ENCOUNTER — Other Ambulatory Visit (HOSPITAL_COMMUNITY): Payer: Self-pay

## 2024-08-05 ENCOUNTER — Ambulatory Visit: Admitting: Orthopedic Surgery

## 2024-08-05 VITALS — BP 120/70 | HR 73 | Temp 97.3°F | Ht 69.0 in | Wt 294.4 lb

## 2024-08-05 DIAGNOSIS — M25511 Pain in right shoulder: Secondary | ICD-10-CM

## 2024-08-05 DIAGNOSIS — L853 Xerosis cutis: Secondary | ICD-10-CM

## 2024-08-05 MED ORDER — TIZANIDINE HCL 4 MG PO TABS
4.0000 mg | ORAL_TABLET | Freq: Three times a day (TID) | ORAL | 0 refills | Status: AC | PRN
  Filled 2024-08-05: qty 30, 10d supply, fill #0

## 2024-08-05 MED ORDER — TRIAMCINOLONE ACETONIDE 0.1 % EX CREA
1.0000 | TOPICAL_CREAM | Freq: Two times a day (BID) | CUTANEOUS | 3 refills | Status: AC
  Filled 2024-08-05: qty 30, 60d supply, fill #0

## 2024-08-05 NOTE — Progress Notes (Signed)
 Careteam: Patient Care Team: [name], NP as PCP - General (Adult Health Nurse Practitioner) Jeffrie Oneil BROCKS, MD as PCP - Cardiology (Cardiology) Lita Lye, OD as Referring Physician (Optometry)  Seen by: Greig Gil, AGNP-C  PLACE OF SERVICE:  [facility]  Advanced Directive information    Allergies[1]  Chief Complaint  Patient presents with   Acute Visit    Neck/shoulder pain for about a month.     HPI: Patient is a 58 y.o. male seen today for acute visit due to shoulder pain.   Discussed the use of AI scribe software for clinical note transcription with the patient, who gave verbal consent to proceed.  History of Present Illness   Larry Rose is a 58 year old male who presents with neck and shoulder pain.  He has been experiencing a pinching pain in the neck and shoulder area for the past couple of weeks, which began while performing physical tasks at work, specifically pushing and pulling trash bins. The pain is described as a cramping sensation, similar to a 'Charlie horse', and is exacerbated by movement, particularly in the morning when getting dressed or taking a shower. The pain subsides when he remains still. No radiation of the pain to the hand is noted. He denies any recent injury or accident.  He has a history of injections in the neck several years ago, which were administered from the shoulder blade upwards. He has not had any shoulder surgery. For pain management, he has been taking Tylenol  Extra Strength and the 8-hour Tylenol , which seems to help. He has not taken any Tylenol  on the day of the visit to assess his pain level.  He mentions a previous arm injury from 2012, which required surgery and extensive therapy. He has rods and pins in his arm, affecting three fingers, but this is unrelated to his current neck and shoulder pain.   Additionally, he is currently using a cream for a dry skin on his stomach, which improved with use but  worsened after he ran out of the cream. He describes the area as dry skin and has been using a cream prescribed previously. If not improvement with cream. He plans to go to [facility].      Review of Systems:  Review of Systems  Constitutional: Negative.   Respiratory: Negative.    Cardiovascular: Negative.   Musculoskeletal:  Positive for joint pain and neck pain. Negative for falls.  Skin:  Positive for rash.  Psychiatric/Behavioral: Negative.      Past Medical History:  Diagnosis Date   ALCOHOLISM 11/30/2009   ALLERGIC RHINITIS 04/01/2007   CARPAL TUNNEL SYNDROME, RIGHT 10/06/2008   DIABETES MELLITUS, TYPE II 04/01/2007   Hepatic steatosis    HYPERLIPIDEMIA, ATHEROGENIC 09/29/2007   HYPERTENSION 04/01/2007   Rosacea 05/14/2010   TINEA CRURIS 11/07/2008   Past Surgical History:  Procedure Laterality Date   carpel tunnel     FRACTURE SURGERY     Social History:   reports that he has never smoked. He has never used smokeless tobacco. He reports current alcohol use of about 4.0 - 5.0 standard drinks of alcohol per week. He reports that he does not currently use drugs.  Family History  Problem Relation Age of Onset   Hypertension Father    Pneumonia Father    Diabetes Maternal Grandmother    Heart attack Maternal Grandfather    Stroke Paternal Grandfather    Cancer Neg Hx  Medications: Patient's Medications  New Prescriptions   No medications on file  Previous Medications   AMOXICILLIN -CLAVULANATE (AUGMENTIN ) 875-125 MG TABLET    Take 1 tablet by mouth every 12 (twelve) hours.   BD PEN NEEDLE NANO 2ND GEN 32G X 4 MM MISC    USE 3 TIMES PER DAY.   COLCHICINE  (MITIGARE ) 0.6 MG CAPS    Take 1 capsule by mouth as needed.   CONTINUOUS GLUCOSE SENSOR (DEXCOM G7 SENSOR) MISC    Use to check glucose continuously, changing the sensor every 10 days.   DAPAGLIFLOZIN  PROPANEDIOL (FARXIGA ) 10 MG TABS TABLET    Take 1 tablet (10 mg total) by mouth daily before breakfast.    FUROSEMIDE  (LASIX ) 20 MG TABLET    Take 1 tablet (20 mg total) by mouth daily as needed.   INSULIN  PEN NEEDLE (INSUPEN PEN NEEDLES) 32G X 4 MM MISC    Use 3 (three) times daily.   INSULIN  REGULAR HUMAN CONCENTRATED (HUMULIN  R U-500 KWIKPEN) 500 UNIT/ML KWIKPEN    Inject 160 Units into the skin in the morning AND 130 Units daily before lunch AND 160 Units every evening.   LISINOPRIL  (ZESTRIL ) 20 MG TABLET    Take 1 tablet (20 mg total) by mouth daily.   MULTIPLE VITAMIN (MULTIVITAMIN) TABLET    Take 1 tablet by mouth daily.   OMEGA-3 ACID ETHYL ESTERS (LOVAZA ) 1 G CAPSULE    Take 1 capsule (1 g total) by mouth 2 (two) times daily.   ROSUVASTATIN  (CRESTOR ) 10 MG TABLET    Take 1 tablet (10 mg total) by mouth daily.   TIRZEPATIDE  (MOUNJARO ) 15 MG/0.5ML PEN    Inject 15 mg into the skin once a week.   TRIAMCINOLONE  CREAM (KENALOG ) 0.1 %    Apply 1 Application topically as needed.  Modified Medications   No medications on file  Discontinued Medications   No medications on file    Physical Exam:  Vitals:   08/05/24 0836  BP: 120/70  Pulse: 73  Temp: (!) 97.3 F (36.3 C)  SpO2: 96%  Weight: 294 lb 6.4 oz (133.5 kg)  Height: 5' 9 (1.753 m)   Body mass index is 43.48 kg/m. Wt Readings from Last 3 Encounters:  08/05/24 294 lb 6.4 oz (133.5 kg)  06/10/24 296 lb 3.2 oz (134.4 kg)  01/22/24 248 lb (112.5 kg)    Physical Exam Vitals reviewed.  Constitutional:      General: He is not in acute distress.    Appearance: He is obese.  HENT:     Head: Normocephalic.  Eyes:     General:        Right eye: No discharge.        Left eye: No discharge.  Cardiovascular:     Rate and Rhythm: Normal rate and regular rhythm.     Pulses: Normal pulses.     Heart sounds: Normal heart sounds.  Pulmonary:     Effort: Pulmonary effort is normal.     Breath sounds: Normal breath sounds.  Abdominal:     General: Bowel sounds are normal.     Palpations: Abdomen is soft.  Musculoskeletal:         General: Normal range of motion.     Right shoulder: No swelling, deformity, tenderness or crepitus. Normal range of motion. Normal strength.     Cervical back: Neck supple. No rigidity or tenderness.  Skin:    General: Skin is warm.     Capillary Refill: Capillary  refill takes less than 2 seconds.  Neurological:     General: No focal deficit present.     Mental Status: He is alert and oriented to person, place, and time.  Psychiatric:        Mood and Affect: Mood normal.     Labs reviewed: Basic Metabolic Panel: Recent Labs    11/18/23 0814 06/24/24 0829  NA 135 137  K 5.2 5.0  CL 100 100  CO2 28 29  GLUCOSE 228* 145*  BUN 15 14  CREATININE 0.70 0.72  CALCIUM  8.8 9.0   Liver Function Tests: Recent Labs    11/18/23 0814 06/24/24 0829  AST 22 20  ALT 23 22  BILITOT 0.5 0.5  PROT 6.7 6.8   No results for input(s): LIPASE, AMYLASE in the last 8760 hours. No results for input(s): AMMONIA in the last 8760 hours. CBC: Recent Labs    11/18/23 0814 06/24/24 0829  WBC 6.2 7.8  NEUTROABS 3,559 4,758  HGB 15.0 15.5  HCT 44.5 46.7  MCV 93.3 93.4  PLT 179 203   Lipid Panel: Recent Labs    11/18/23 0814 03/18/24 0811 06/24/24 0829  CHOL 186 174 155  HDL 35* 36* 37*  LDLCALC  --   --  87  TRIG 716* 438* 211*  CHOLHDL 5.3* 4.8 4.2   TSH: No results for input(s): TSH in the last 8760 hours. A1C: Lab Results  Component Value Date   HGBA1C 7.1 (H) 06/24/2024     Assessment/Plan 1. Acute pain of right shoulder (Primary) - onset 2-3 weeks  - no radiation of pain - exam unremarkable - suspect muscular involvement - poor candidate for oral prednisone due to T2DM - will try muscle relaxer - discussed heat application, rest and lidocaine  patches - if no improvement, consider [facility] evaluation  - tiZANidine  (ZANAFLEX ) 4 MG tablet; Take 1 tablet (4 mg total) by mouth 3 (three) times daily as needed for muscle spasms.  Dispense: 30 tablet; Refill:  0  2. Dry skin dermatitis - ongoing - improved with triamcinolone  in past - triamcinolone  cream (KENALOG ) 0.1 %; Apply 1 Application topically 2 (two) times daily.  Dispense: 30 g; Refill: 3  Total time: 21 minutes. Greater than 50% of total time spent doing patient education regarding right shoulder pain and skin rash including symptom/medication management.    Next appt: 12/16/2024  Greig Gil BODILY  [facility] [phone]     [1]  Allergies Allergen Reactions   Enalapril Maleate    Lovastatin

## 2024-08-05 NOTE — Patient Instructions (Addendum)
 Continue tylenol  as needed> 3000 mg is the daily limit  Try muscle relaxer for shoulder (can cause drowsiness)> if no improvement go to [facility] to see Dr. Duwayne or PA Lonell Randy   Try heating pad   Consider lidocaine  pain patch on shoulder> generic or  [place]   Shampoo> also use a body wash> call ed  Nizoral> active ingredient  ketoconazole > try that on rash

## 2024-08-06 ENCOUNTER — Other Ambulatory Visit: Payer: Self-pay

## 2024-08-10 ENCOUNTER — Other Ambulatory Visit (HOSPITAL_COMMUNITY): Payer: Self-pay

## 2024-08-27 ENCOUNTER — Other Ambulatory Visit: Payer: Self-pay

## 2024-08-27 ENCOUNTER — Other Ambulatory Visit: Payer: Self-pay | Admitting: Endocrinology

## 2024-08-27 ENCOUNTER — Other Ambulatory Visit: Payer: Self-pay | Admitting: Orthopedic Surgery

## 2024-08-27 DIAGNOSIS — Z794 Long term (current) use of insulin: Secondary | ICD-10-CM

## 2024-08-27 DIAGNOSIS — E781 Pure hyperglyceridemia: Secondary | ICD-10-CM

## 2024-08-27 MED ORDER — OMEGA-3-ACID ETHYL ESTERS 1 G PO CAPS
1.0000 g | ORAL_CAPSULE | Freq: Two times a day (BID) | ORAL | 2 refills | Status: AC
  Filled 2024-08-27: qty 180, 90d supply, fill #0

## 2024-08-27 MED ORDER — DAPAGLIFLOZIN PROPANEDIOL 10 MG PO TABS
10.0000 mg | ORAL_TABLET | Freq: Every day | ORAL | 3 refills | Status: AC
  Filled 2024-08-27 (×2): qty 90, 90d supply, fill #0

## 2024-08-30 ENCOUNTER — Other Ambulatory Visit: Payer: Self-pay

## 2024-08-30 ENCOUNTER — Other Ambulatory Visit (HOSPITAL_COMMUNITY): Payer: Self-pay

## 2024-08-31 ENCOUNTER — Other Ambulatory Visit: Payer: Self-pay

## 2024-09-08 ENCOUNTER — Other Ambulatory Visit: Payer: Self-pay | Admitting: Orthopedic Surgery

## 2024-09-08 ENCOUNTER — Other Ambulatory Visit: Payer: Self-pay

## 2024-09-08 DIAGNOSIS — I10 Essential (primary) hypertension: Secondary | ICD-10-CM

## 2024-09-08 MED ORDER — FUROSEMIDE 20 MG PO TABS
20.0000 mg | ORAL_TABLET | Freq: Every day | ORAL | 1 refills | Status: AC | PRN
  Filled 2024-09-08: qty 90, 90d supply, fill #0

## 2024-09-16 ENCOUNTER — Other Ambulatory Visit: Payer: Self-pay

## 2024-12-16 ENCOUNTER — Ambulatory Visit: Payer: Self-pay | Admitting: Orthopedic Surgery
